# Patient Record
Sex: Female | Born: 1960 | Race: White | Hispanic: No | Smoking: Former smoker
Health system: Southern US, Community
[De-identification: ages and names within clinical notes are randomized; demographics above are authoritative.]

## PROBLEM LIST (undated history)

## (undated) DIAGNOSIS — L309 Dermatitis, unspecified: Secondary | ICD-10-CM

## (undated) DIAGNOSIS — I639 Cerebral infarction, unspecified: Secondary | ICD-10-CM

## (undated) DIAGNOSIS — E785 Hyperlipidemia, unspecified: Secondary | ICD-10-CM

## (undated) DIAGNOSIS — F329 Major depressive disorder, single episode, unspecified: Secondary | ICD-10-CM

## (undated) DIAGNOSIS — I10 Essential (primary) hypertension: Secondary | ICD-10-CM

## (undated) DIAGNOSIS — E559 Vitamin D deficiency, unspecified: Secondary | ICD-10-CM

## (undated) DIAGNOSIS — F32A Depression, unspecified: Secondary | ICD-10-CM

## (undated) DIAGNOSIS — K529 Noninfective gastroenteritis and colitis, unspecified: Secondary | ICD-10-CM

## (undated) HISTORY — DX: Vitamin D deficiency, unspecified: E55.9

## (undated) HISTORY — DX: Noninfective gastroenteritis and colitis, unspecified: K52.9

## (undated) HISTORY — DX: Depression, unspecified: F32.A

## (undated) HISTORY — DX: Essential (primary) hypertension: I10

## (undated) HISTORY — DX: Dermatitis, unspecified: L30.9

## (undated) HISTORY — DX: Cerebral infarction, unspecified: I63.9

## (undated) HISTORY — DX: Hyperlipidemia, unspecified: E78.5

## (undated) HISTORY — DX: Major depressive disorder, single episode, unspecified: F32.9

## (undated) HISTORY — PX: HAND SURGERY: SHX662

---

## 1998-03-01 ENCOUNTER — Ambulatory Visit (HOSPITAL_BASED_OUTPATIENT_CLINIC_OR_DEPARTMENT_OTHER): Admission: RE | Admit: 1998-03-01 | Discharge: 1998-03-01 | Payer: Self-pay | Admitting: *Deleted

## 1998-10-06 ENCOUNTER — Other Ambulatory Visit: Admission: RE | Admit: 1998-10-06 | Discharge: 1998-10-06 | Payer: Self-pay | Admitting: *Deleted

## 2000-01-18 ENCOUNTER — Other Ambulatory Visit: Admission: RE | Admit: 2000-01-18 | Discharge: 2000-01-18 | Payer: Self-pay | Admitting: *Deleted

## 2001-03-30 ENCOUNTER — Other Ambulatory Visit: Admission: RE | Admit: 2001-03-30 | Discharge: 2001-03-30 | Payer: Self-pay | Admitting: Gastroenterology

## 2001-03-30 ENCOUNTER — Encounter (INDEPENDENT_AMBULATORY_CARE_PROVIDER_SITE_OTHER): Payer: Self-pay

## 2004-11-19 ENCOUNTER — Other Ambulatory Visit: Admission: RE | Admit: 2004-11-19 | Discharge: 2004-11-19 | Payer: Self-pay | Admitting: Endocrinology

## 2005-10-09 ENCOUNTER — Ambulatory Visit: Payer: Self-pay | Admitting: Internal Medicine

## 2009-07-14 ENCOUNTER — Encounter: Admission: RE | Admit: 2009-07-14 | Discharge: 2009-07-14 | Payer: Self-pay | Admitting: Gynecology

## 2010-07-31 ENCOUNTER — Encounter: Admission: RE | Admit: 2010-07-31 | Discharge: 2010-07-31 | Payer: Self-pay | Admitting: Gynecology

## 2011-08-09 ENCOUNTER — Other Ambulatory Visit: Payer: Self-pay | Admitting: Gynecology

## 2011-08-09 DIAGNOSIS — Z1231 Encounter for screening mammogram for malignant neoplasm of breast: Secondary | ICD-10-CM

## 2011-08-16 ENCOUNTER — Ambulatory Visit
Admission: RE | Admit: 2011-08-16 | Discharge: 2011-08-16 | Disposition: A | Discharge status: Home / Self Care | Payer: BC Managed Care – PPO | Source: Ambulatory Visit | Attending: Gynecology | Admitting: Gynecology

## 2011-08-16 DIAGNOSIS — Z1231 Encounter for screening mammogram for malignant neoplasm of breast: Secondary | ICD-10-CM

## 2012-08-10 ENCOUNTER — Other Ambulatory Visit: Payer: Self-pay | Admitting: Family Medicine

## 2012-08-10 DIAGNOSIS — Z1231 Encounter for screening mammogram for malignant neoplasm of breast: Secondary | ICD-10-CM

## 2013-02-08 ENCOUNTER — Telehealth: Payer: Self-pay

## 2013-02-08 NOTE — Telephone Encounter (Signed)
appt made

## 2013-02-08 NOTE — Telephone Encounter (Signed)
Wants a referral 

## 2013-02-09 ENCOUNTER — Ambulatory Visit (INDEPENDENT_AMBULATORY_CARE_PROVIDER_SITE_OTHER): Payer: BC Managed Care – PPO | Admitting: Physician Assistant

## 2013-02-09 ENCOUNTER — Encounter: Payer: Self-pay | Admitting: Physician Assistant

## 2013-02-09 VITALS — BP 126/81 | HR 76 | Temp 97.3°F | Ht 64.0 in | Wt 162.2 lb

## 2013-02-09 DIAGNOSIS — F32A Depression, unspecified: Secondary | ICD-10-CM | POA: Insufficient documentation

## 2013-02-09 DIAGNOSIS — F329 Major depressive disorder, single episode, unspecified: Secondary | ICD-10-CM

## 2013-02-09 DIAGNOSIS — R5383 Other fatigue: Secondary | ICD-10-CM

## 2013-02-09 DIAGNOSIS — R5381 Other malaise: Secondary | ICD-10-CM

## 2013-02-09 LAB — THYROID PANEL WITH TSH
Free Thyroxine Index: 1.8 (ref 1.0–3.9)
T3 Uptake: 17 % — ABNORMAL LOW (ref 22.5–37.0)
T4, Total: 10.4 ug/dL (ref 5.0–12.5)
TSH: 2.064 u[IU]/mL (ref 0.350–4.500)

## 2013-02-09 LAB — POCT CBC
Granulocyte percent: 59.7 %G (ref 37–80)
HCT, POC: 39.9 % (ref 37.7–47.9)
Hemoglobin: 13.9 g/dL (ref 12.2–16.2)
Lymph, poc: 1.8 (ref 0.6–3.4)
MCH, POC: 31.9 pg — AB (ref 27–31.2)
MCHC: 34.9 g/dL (ref 31.8–35.4)
MCV: 91.3 fL (ref 80–97)
MPV: 7.2 fL (ref 0–99.8)
POC Granulocyte: 3 (ref 2–6.9)
POC LYMPH PERCENT: 36.7 %L (ref 10–50)
Platelet Count, POC: 277 10*3/uL (ref 142–424)
RBC: 4.4 M/uL (ref 4.04–5.48)
RDW, POC: 13.3 %
WBC: 5 10*3/uL (ref 4.6–10.2)

## 2013-02-09 NOTE — Patient Instructions (Signed)
Goiter Goiter is an enlarged thyroid gland. The thyroid gland sits at the base of the front of the neck. The gland produces hormones that regulate mood, body temperature, pulse rate, and digestion. Most goiters are painless and are not a cause for serious concern. Goiters and conditions that cause goiters can be treated if necessary.  CAUSES  Common causes of goiter include:  Graves disease (causes too much hormone to be produced [hyperthyroidism]).  Hashimoto's disease (causes too little hormone to be produced [hypothyroidism]).  Thyroiditis (inflammation of the thyroid sometimes caused by virus or pregnancy).  Nodular goiter (small bumps form; sometimes called toxic nodular goiter).  Pregnancy.  Thyroid cancer (very few goiters with nodules are cancerous).  Certain medications.  Radiation exposure.  Iodine deficiency (more common in developing countries in inland populations). RISK FACTORS Risk factors for goiter include:  A family history of goiter.  Female gender.  Inadequate iodine in the diet.  Age older than 40 years. SYMPTOMS  Many goiters do not cause symptoms. When symptoms do occur, they may include:  Swelling in the lower part of the neck. This swelling can range from a very small bump to a large lump.  A tight feeling in the throat.  A hoarse voice. Less commonly, a goiter may result in:  Coughing.  Wheezing.  Difficulty swallowing.  Difficulty breathing.  Bulging neck veins.  Dizziness. When a goiter is the result of hyperthyroidism, symptoms may include:  Rapid or irregular heart beat.  Sicknessin your stomach (nausea).  Vomiting.  Diarrhea.  Shaking.  Irritable feeling.  Bulging eyes.  Weight loss.  Heat sensitivity.  Anxiety. When a goiter is the result of hypothyroidism, symptoms may include:  Tiredness.  Dry skin.  Constipation.  Weight gain.  Irregular menstrual cycle.  Depressed mood.  Sensitivity to  cold. DIAGNOSIS  Tests used to diagnose goiter include:  A physical exam.  Blood tests, including thyroid hormone levels and antibody testing.  Ultrasonography, computerized X-ray scan (computed tomography, CT) or computerized magnetic scan (magnetic resonance imaging, MRI).  Thyroid scan (imaging along with safe radioactive injection).  Tissue sample taken (biopsy) of nodules. This is sometimes done to confirm that the nodules are not cancerous. TREATMENT  Treatment will depend on the cause of the goiter. Treatment may include:  Monitoring. In some cases, no treatment is necessary, and your doctor will monitor yourcondition at regular check ups.  Medications and supplements. Thyroid medication (thyroid hormone replacement) is available for hyperthroidism and hypothyroidism.  If inflammation is the cause, over-the-counter medication or steroid medication may be recommended.  Goiters caused by iodine deficiency can be treated with iodine supplements or changes in diet.  Radioactive iodine treatment. Radioactive iodine is injected into the blood. It travels to the thyroid gland, kills thyroid cells, and reduces the size of the gland. This is only used when the thyroid gland is overactive. Lifelong thyroid hormone medication is often necessary after this treatment.  Surgery. A procedure to remove all or part of the gland may be recommended in severe cases or when cancer is the cause. Hormones can be taken to replace the hormones normally produced by the thyroid. HOME CARE INSTRUCTIONS   Take medications as directed.  Follow your caregiver's recommendations for any dietary changes.  Follow up with your caregiver for further examination and testing, as directed. PREVENTION   If you have a family history of goiter, discuss screening with your doctor.  Make sure you are getting enough iodine in your diet.  Use   of iodized table salt can help prevent iodine deficiency. Document  Released: 02/13/2010 Document Revised: 11/18/2011 Document Reviewed: 02/13/2010 [employer] Patient Information 2014 [employer].  

## 2013-02-09 NOTE — Progress Notes (Signed)
Subjective:     Patient ID: Janet Randolph, female   DOB: 06/22/1961, 52 y.o.   MRN: [id number]  HPI Pt with a complicated hx She states long ago she was dx'd with goiter after a biopsy Per pt she then became hyperthyroid and was sent to endocrin There she was given Iodine therapy and per her was told she was cured She went back for several visits but never remembers being put on any meds Pt now with fatigue and weight gain She ws talking with her friends and they recommended she f/u Her endocrin has retired  Review of Systems  All other systems reviewed and are negative.       Objective:   Physical Exam  Nursing note and vitals reviewed.  Thyroid is prominent bilat No cerv nodes No bruits Heart- RRR w/o M Lungs- CTA    Assessment:     1. Other malaise and fatigue   2. Depression        Plan:     CBC and Thyroid profile done today Will inform of lab results F/U pending labs

## 2013-03-05 ENCOUNTER — Encounter: Payer: Self-pay | Admitting: Nurse Practitioner

## 2013-03-05 ENCOUNTER — Ambulatory Visit (INDEPENDENT_AMBULATORY_CARE_PROVIDER_SITE_OTHER): Payer: BC Managed Care – PPO | Admitting: Nurse Practitioner

## 2013-03-05 ENCOUNTER — Telehealth: Payer: Self-pay | Admitting: Family Medicine

## 2013-03-05 VITALS — BP 133/82 | HR 81 | Temp 97.0°F | Ht 64.0 in | Wt 162.0 lb

## 2013-03-05 DIAGNOSIS — I10 Essential (primary) hypertension: Secondary | ICD-10-CM

## 2013-03-05 DIAGNOSIS — E785 Hyperlipidemia, unspecified: Secondary | ICD-10-CM

## 2013-03-05 DIAGNOSIS — F32A Depression, unspecified: Secondary | ICD-10-CM

## 2013-03-05 DIAGNOSIS — F329 Major depressive disorder, single episode, unspecified: Secondary | ICD-10-CM

## 2013-03-05 DIAGNOSIS — F411 Generalized anxiety disorder: Secondary | ICD-10-CM

## 2013-03-05 MED ORDER — LISINOPRIL 20 MG PO TABS: 20.0000 mg | ORAL_TABLET | Freq: Every day | ORAL | Status: DC

## 2013-03-05 MED ORDER — VENLAFAXINE HCL ER 75 MG PO CP24: 75.0000 mg | ORAL_CAPSULE | Freq: Every day | ORAL | Status: DC

## 2013-03-05 MED ORDER — SIMVASTATIN 20 MG PO TABS: 20.0000 mg | ORAL_TABLET | Freq: Every evening | ORAL | Status: DC

## 2013-03-05 MED ORDER — ALPRAZOLAM 0.5 MG PO TABS: 0.5000 mg | ORAL_TABLET | Freq: Two times a day (BID) | ORAL | Status: DC

## 2013-03-05 MED ORDER — EFFEXOR XR 75 MG PO CP24: ORAL_CAPSULE | ORAL | Status: DC

## 2013-03-05 NOTE — Telephone Encounter (Signed)
Pt aware rx fixed at [facility]

## 2013-03-05 NOTE — Telephone Encounter (Signed)
Cant take generic for effxeor and you wrote it for one a day she usually has it for 2 a day

## 2013-03-05 NOTE — Telephone Encounter (Signed)
Corrected rx sent to pharmacy.

## 2013-03-05 NOTE — Progress Notes (Signed)
Subjective:    Patient ID: Janet Randolph, female    DOB: 01/07/1961, 52 y.o.   MRN: [id number]  Hypertension This is a chronic problem. The current episode started more than 1 year ago. The problem has been resolved since onset. The problem is controlled. Pertinent negatives include no blurred vision, chest pain, headaches, malaise/fatigue, orthopnea, palpitations, peripheral edema, shortness of breath or sweats. There are no associated agents to hypertension. Risk factors for coronary artery disease include dyslipidemia. Past treatments include ACE inhibitors. The current treatment provides moderate improvement. Compliance problems include diet.   Hyperlipidemia This is a chronic problem. The current episode started more than 1 year ago. The problem is uncontrolled. Recent lipid tests were reviewed and are high. She has no history of diabetes, hypothyroidism, liver disease or obesity. There are no known factors aggravating her hyperlipidemia. Pertinent negatives include no chest pain, leg pain, myalgias or shortness of breath. Current antihyperlipidemic treatment includes statins. The current treatment provides moderate improvement of lipids. Compliance problems include adherence to diet.  Risk factors for coronary artery disease include hypertension.  Depression Patient currently on effexoe 75 mg and is doing well. No complaint of side effects. GAD Xanax- takes one every morning- no complaints or problems   Review of Systems  Constitutional: Negative for malaise/fatigue.  Eyes: Negative for blurred vision.  Respiratory: Negative for shortness of breath.   Cardiovascular: Negative for chest pain, palpitations and orthopnea.  Musculoskeletal: Negative for myalgias.  Neurological: Negative for headaches.  All other systems reviewed and are negative.       Objective:   Physical Exam  Constitutional: She is oriented to person, place, and time. She appears well-developed and  well-nourished.  HENT:  Nose: Nose normal.  Mouth/Throat: Oropharynx is clear and moist.  Eyes: EOM are normal.  Neck: Trachea normal, normal range of motion and full passive range of motion without pain. Neck supple. No JVD present. Carotid bruit is not present. No thyromegaly present.  Cardiovascular: Normal rate, regular rhythm, normal heart sounds and intact distal pulses.  Exam reveals no gallop and no friction rub.   No murmur heard. Pulmonary/Chest: Effort normal and breath sounds normal.  Abdominal: Soft. Bowel sounds are normal. She exhibits no distension and no mass. There is no tenderness.  Musculoskeletal: Normal range of motion.  Lymphadenopathy:    She has no cervical adenopathy.  Neurological: She is alert and oriented to person, place, and time. She has normal reflexes.  Skin: Skin is warm and dry.  Psychiatric: She has a normal mood and affect. Her behavior is normal. Judgment and thought content normal.     BP 133/82  Pulse 81  Temp(Src) 97 F (36.1 C) (Oral)  Ht 5\' 4"  (1.626 m)  Wt 162 lb (73.483 kg)  BMI 27.79 kg/m2      Assessment & Plan:   1. Hyperlipidemia   2. Hypertension   3. Depression   4. GAD (generalized anxiety disorder)    Labs done several weeks ago- reviewed at appointment Meds ordered this encounter  Medications  . desonide (DESOWEN) 0.05 % cream    Sig:   . DISCONTD: lisinopril (PRINIVIL,ZESTRIL) 20 MG tablet    Sig: Take 20 mg by mouth daily.  Marland Kitchen DISCONTD: simvastatin (ZOCOR) 20 MG tablet    Sig: Take 20 mg by mouth every evening.  . venlafaxine XR (EFFEXOR XR) 75 MG 24 hr capsule    Sig: Take 1 capsule (75 mg total) by mouth daily.    Dispense:  30 capsule    Refill:  5    Order Specific Question:  Supervising Provider    Answer:  Ernestina Penna [[id number]]  . lisinopril (PRINIVIL,ZESTRIL) 20 MG tablet    Sig: Take 1 tablet (20 mg total) by mouth daily.    Dispense:  30 tablet    Refill:  5    Order Specific Question:   Supervising Provider    Answer:  Ernestina Penna [[id number]]  . simvastatin (ZOCOR) 20 MG tablet    Sig: Take 1 tablet (20 mg total) by mouth every evening.    Dispense:  30 tablet    Refill:  5    Order Specific Question:  Supervising Provider    Answer:  Ernestina Penna [[id number]]  . ALPRAZolam (XANAX) 0.5 MG tablet    Sig: Take 1 tablet (0.5 mg total) by mouth 2 (two) times daily.    Dispense:  60 tablet    Refill:  2    Order Specific Question:  Supervising Provider    Answer:  Deborra Medina   Continue all meds Diet and exercise encouraged  Mary-Margaret Daphine Deutscher, FNP

## 2013-03-05 NOTE — Patient Instructions (Signed)

## 2013-06-04 ENCOUNTER — Encounter: Payer: Self-pay | Admitting: Nurse Practitioner

## 2013-06-04 ENCOUNTER — Ambulatory Visit (INDEPENDENT_AMBULATORY_CARE_PROVIDER_SITE_OTHER): Payer: BC Managed Care – PPO | Admitting: Nurse Practitioner

## 2013-06-04 VITALS — BP 157/81 | HR 74 | Temp 97.3°F | Ht 64.0 in | Wt 163.0 lb

## 2013-06-04 DIAGNOSIS — F411 Generalized anxiety disorder: Secondary | ICD-10-CM

## 2013-06-04 DIAGNOSIS — I10 Essential (primary) hypertension: Secondary | ICD-10-CM

## 2013-06-04 DIAGNOSIS — F32A Depression, unspecified: Secondary | ICD-10-CM

## 2013-06-04 DIAGNOSIS — F3289 Other specified depressive episodes: Secondary | ICD-10-CM

## 2013-06-04 DIAGNOSIS — E785 Hyperlipidemia, unspecified: Secondary | ICD-10-CM

## 2013-06-04 DIAGNOSIS — F329 Major depressive disorder, single episode, unspecified: Secondary | ICD-10-CM

## 2013-06-04 MED ORDER — SIMVASTATIN 20 MG PO TABS: 20.0000 mg | ORAL_TABLET | Freq: Every evening | ORAL | Status: DC

## 2013-06-04 MED ORDER — ALPRAZOLAM 0.5 MG PO TABS: 0.5000 mg | ORAL_TABLET | Freq: Two times a day (BID) | ORAL | Status: DC

## 2013-06-04 MED ORDER — LISINOPRIL 20 MG PO TABS: 20.0000 mg | ORAL_TABLET | Freq: Every day | ORAL | Status: DC

## 2013-06-04 MED ORDER — EFFEXOR XR 75 MG PO CP24: ORAL_CAPSULE | ORAL | Status: DC

## 2013-06-04 NOTE — Progress Notes (Signed)
Subjective:    Patient ID: Janet Randolph, female    DOB: Aug 20, 1961, 52 y.o.   MRN: [id number]  Hypertension This is a chronic problem. The current episode started more than 1 year ago. The problem has been resolved since onset. The problem is controlled. Pertinent negatives include no blurred vision, chest pain, headaches, malaise/fatigue, orthopnea, palpitations, peripheral edema, shortness of breath or sweats. There are no associated agents to hypertension. Risk factors for coronary artery disease include dyslipidemia. Past treatments include ACE inhibitors. The current treatment provides moderate improvement. Compliance problems include diet.   Hyperlipidemia This is a chronic problem. The current episode started more than 1 year ago. The problem is uncontrolled. Recent lipid tests were reviewed and are high. She has no history of diabetes, hypothyroidism, liver disease or obesity. There are no known factors aggravating her hyperlipidemia. Pertinent negatives include no chest pain, leg pain, myalgias or shortness of breath. Current antihyperlipidemic treatment includes statins. The current treatment provides moderate improvement of lipids. Compliance problems include adherence to diet.  Risk factors for coronary artery disease include hypertension.  Depression Patient currently on effexoe 75 mg and is doing well. No complaint of side effects. GAD Xanax- takes one every morning- no complaints or problems   Review of Systems  Constitutional: Negative for malaise/fatigue.  Eyes: Negative for blurred vision.  Respiratory: Negative for shortness of breath.   Cardiovascular: Negative for chest pain, palpitations and orthopnea.  Musculoskeletal: Negative for myalgias.  Neurological: Negative for headaches.  All other systems reviewed and are negative.       Objective:   Physical Exam  Constitutional: She is oriented to person, place, and time. She appears well-developed and  well-nourished.  HENT:  Nose: Nose normal.  Mouth/Throat: Oropharynx is clear and moist.  Eyes: EOM are normal.  Neck: Trachea normal, normal range of motion and full passive range of motion without pain. Neck supple. No JVD present. Carotid bruit is not present. No thyromegaly present.  Cardiovascular: Normal rate, regular rhythm, normal heart sounds and intact distal pulses.  Exam reveals no gallop and no friction rub.   No murmur heard. Pulmonary/Chest: Effort normal and breath sounds normal.  Abdominal: Soft. Bowel sounds are normal. She exhibits no distension and no mass. There is no tenderness.  Musculoskeletal: Normal range of motion.  Lymphadenopathy:    She has no cervical adenopathy.  Neurological: She is alert and oriented to person, place, and time. She has normal reflexes.  Skin: Skin is warm and dry.  Psychiatric: She has a normal mood and affect. Her behavior is normal. Judgment and thought content normal.     BP 157/81  Pulse 74  Temp(Src) 97.3 F (36.3 C) (Oral)  Ht 5\' 4"  (1.626 m)  Wt 163 lb (73.936 kg)  BMI 27.97 kg/m2      Assessment & Plan:   1. GAD (generalized anxiety disorder)   2. Depression   3. Hyperlipidemia   4. Hypertension    Orders Placed This Encounter  Procedures  . CMP14+EGFR  . NMR, lipoprofile   Meds ordered this encounter  Medications  . ALPRAZolam (XANAX) 0.5 MG tablet    Sig: Take 1 tablet (0.5 mg total) by mouth 2 (two) times daily.    Dispense:  60 tablet    Refill:  2    Order Specific Question:  Supervising Provider    Answer:  Ernestina Penna [[id number]]  . EFFEXOR XR 75 MG 24 hr capsule    Sig: 2 PO QD  Dispense:  60 capsule    Refill:  5    Order Specific Question:  Supervising Provider    Answer:  Ernestina Penna [[id number]]  . simvastatin (ZOCOR) 20 MG tablet    Sig: Take 1 tablet (20 mg total) by mouth every evening.    Dispense:  30 tablet    Refill:  5    Order Specific Question:  Supervising Provider    Answer:   Ernestina Penna [[id number]]  . lisinopril (PRINIVIL,ZESTRIL) 20 MG tablet    Sig: Take 1 tablet (20 mg total) by mouth daily.    Dispense:  30 tablet    Refill:  5    Order Specific Question:  Supervising Provider    Answer:  Deborra Medina    Continue all meds Labs pending Diet and exercise encouraged Health maintenance reviewed Follow up in 3 months  Mary-Margaret Daphine Deutscher, FNP

## 2013-06-04 NOTE — Progress Notes (Deleted)
Subjective:    Patient ID: Janet Randolph, female    DOB: 01-17-1961, 52 y.o.   MRN: [id number]  Hypertension This is a chronic problem. The current episode started more than 1 year ago. The problem has been waxing and waning since onset. The problem is uncontrolled. Associated symptoms include anxiety. Pertinent negatives include no blurred vision, chest pain, headaches, malaise/fatigue, orthopnea, palpitations, peripheral edema, shortness of breath or sweats. There are no associated agents to hypertension. Risk factors for coronary artery disease include dyslipidemia, post-menopausal state and family history. Past treatments include ACE inhibitors. The current treatment provides moderate improvement. Compliance problems include diet.  There is no history of a thyroid problem.  Hyperlipidemia This is a chronic problem. The current episode started more than 1 year ago. The problem is uncontrolled. Recent lipid tests were reviewed and are high. She has no history of diabetes, hypothyroidism, liver disease or obesity. There are no known factors aggravating her hyperlipidemia. Pertinent negatives include no chest pain, leg pain, myalgias or shortness of breath. Current antihyperlipidemic treatment includes statins. The current treatment provides moderate improvement of lipids. Compliance problems include adherence to diet.  Risk factors for coronary artery disease include hypertension, dyslipidemia, family history and post-menopausal.  Anxiety Presents for follow-up visit. Onset was more than 5 years ago. The problem has been resolved. Symptoms include dizziness and dry mouth. Patient reports no chest pain, confusion, decreased concentration, insomnia, nervous/anxious behavior, palpitations or shortness of breath. Symptoms occur rarely. The quality of sleep is good. Nighttime awakenings: one to two.   There is no history of hyperthyroidism. Past treatments include benzodiazephines. The treatment provided  moderate relief. Compliance with prior treatments has been good.  Depression Patient currently on effexoe 75 mg and is doing well. No complaint of side effects.    Review of Systems  Constitutional: Negative for malaise/fatigue.  Eyes: Negative for blurred vision.  Respiratory: Negative for shortness of breath.   Cardiovascular: Negative for chest pain, palpitations and orthopnea.  Musculoskeletal: Negative for myalgias.  Neurological: Positive for dizziness. Negative for headaches.  Psychiatric/Behavioral: Negative for confusion and decreased concentration. The patient is not nervous/anxious and does not have insomnia.   All other systems reviewed and are negative.       Objective:   Physical Exam  Constitutional: She is oriented to person, place, and time. She appears well-developed and well-nourished.  HENT:  Nose: Nose normal.  Mouth/Throat: Oropharynx is clear and moist.  Eyes: EOM are normal.  Neck: Trachea normal, normal range of motion and full passive range of motion without pain. Neck supple. No JVD present. Carotid bruit is not present. No thyromegaly present.  Cardiovascular: Normal rate, regular rhythm, normal heart sounds and intact distal pulses.  Exam reveals no gallop and no friction rub.   No murmur heard. Pulmonary/Chest: Effort normal and breath sounds normal.  Abdominal: Soft. Bowel sounds are normal. She exhibits no distension and no mass. There is no tenderness.  Musculoskeletal: Normal range of motion.  Lymphadenopathy:    She has no cervical adenopathy.  Neurological: She is alert and oriented to person, place, and time. She has normal reflexes.  Skin: Skin is warm and dry.  Psychiatric: She has a normal mood and affect. Her behavior is normal. Judgment and thought content normal.     BP 157/81  Pulse 74  Temp(Src) 97.3 F (36.3 C) (Oral)  Ht 5\' 4"  (1.626 m)  Wt 163 lb (73.936 kg)  BMI 27.97 kg/m2      Assessment &  Plan:

## 2013-06-04 NOTE — Patient Instructions (Addendum)

## 2013-06-05 LAB — CMP14+EGFR
ALT: 7 IU/L (ref 0–32)
AST: 12 IU/L (ref 0–40)
Albumin/Globulin Ratio: 1.7 (ref 1.1–2.5)
Albumin: 3.8 g/dL (ref 3.5–5.5)
Alkaline Phosphatase: 61 IU/L (ref 39–117)
BUN/Creatinine Ratio: 23 (ref 9–23)
BUN: 17 mg/dL (ref 6–24)
CO2: 26 mmol/L (ref 18–29)
Calcium: 9.2 mg/dL (ref 8.7–10.2)
Chloride: 105 mmol/L (ref 97–108)
Creatinine, Ser: 0.73 mg/dL (ref 0.57–1.00)
GFR calc Af Amer: 108 mL/min/{1.73_m2} (ref >59)
GFR calc non Af Amer: 94 mL/min/{1.73_m2} (ref >59)
Globulin, Total: 2.3 g/dL (ref 1.5–4.5)
Glucose: 89 mg/dL (ref 65–99)
Potassium: 4.4 mmol/L (ref 3.5–5.2)
Sodium: 145 mmol/L — ABNORMAL HIGH (ref 134–144)
Total Bilirubin: 0.1 mg/dL (ref 0.0–1.2)
Total Protein: 6.1 g/dL (ref 6.0–8.5)

## 2013-06-05 LAB — NMR, LIPOPROFILE
Cholesterol: 212 mg/dL — ABNORMAL HIGH (ref <200)
HDL Cholesterol by NMR: 45 mg/dL (ref >=40)
HDL Particle Number: 32.2 umol/L (ref >=30.5)
LDL Particle Number: 1573 nmol/L — ABNORMAL HIGH (ref <1000)
LDL Size: 21.9 nm (ref >20.5)
LDLC SERPL CALC-MCNC: 133 mg/dL — ABNORMAL HIGH (ref <100)
LP-IR Score: 46 — ABNORMAL HIGH (ref <=45)
Small LDL Particle Number: 555 nmol/L — ABNORMAL HIGH (ref <=527)
Triglycerides by NMR: 171 mg/dL — ABNORMAL HIGH (ref <150)

## 2013-06-07 ENCOUNTER — Ambulatory Visit: Payer: BC Managed Care – PPO | Admitting: Nurse Practitioner

## 2013-06-08 ENCOUNTER — Telehealth: Payer: Self-pay | Admitting: Nurse Practitioner

## 2013-06-10 NOTE — Telephone Encounter (Signed)
Patient aware.

## 2013-09-13 ENCOUNTER — Ambulatory Visit: Payer: BC Managed Care – PPO | Admitting: Nurse Practitioner

## 2013-12-02 ENCOUNTER — Other Ambulatory Visit: Payer: Self-pay | Admitting: Nurse Practitioner

## 2013-12-03 ENCOUNTER — Telehealth: Payer: Self-pay | Admitting: Nurse Practitioner

## 2013-12-03 NOTE — Telephone Encounter (Signed)
ntbs for refill of xanax

## 2013-12-03 NOTE — Telephone Encounter (Signed)
Not seen or filled since 09/14, uses [facility] if approved

## 2013-12-03 NOTE — Telephone Encounter (Signed)
Patient aware.

## 2013-12-03 NOTE — Telephone Encounter (Signed)
Detailed message left for patient.

## 2013-12-13 ENCOUNTER — Encounter: Payer: Self-pay | Admitting: Nurse Practitioner

## 2013-12-13 ENCOUNTER — Ambulatory Visit (INDEPENDENT_AMBULATORY_CARE_PROVIDER_SITE_OTHER): Payer: BC Managed Care – PPO | Admitting: Nurse Practitioner

## 2013-12-13 VITALS — BP 139/77 | HR 78 | Temp 97.2°F | Ht 64.0 in | Wt 162.0 lb

## 2013-12-13 DIAGNOSIS — F411 Generalized anxiety disorder: Secondary | ICD-10-CM

## 2013-12-13 DIAGNOSIS — F3289 Other specified depressive episodes: Secondary | ICD-10-CM

## 2013-12-13 DIAGNOSIS — F32A Depression, unspecified: Secondary | ICD-10-CM

## 2013-12-13 DIAGNOSIS — F329 Major depressive disorder, single episode, unspecified: Secondary | ICD-10-CM

## 2013-12-13 DIAGNOSIS — I10 Essential (primary) hypertension: Secondary | ICD-10-CM

## 2013-12-13 DIAGNOSIS — E785 Hyperlipidemia, unspecified: Secondary | ICD-10-CM

## 2013-12-13 MED ORDER — ALPRAZOLAM 0.5 MG PO TABS: 0.5000 mg | ORAL_TABLET | Freq: Two times a day (BID) | ORAL | Status: DC

## 2013-12-13 MED ORDER — EFFEXOR XR 75 MG PO CP24: ORAL_CAPSULE | ORAL | Status: DC

## 2013-12-13 MED ORDER — LISINOPRIL 20 MG PO TABS: 20.0000 mg | ORAL_TABLET | Freq: Every day | ORAL | Status: DC

## 2013-12-13 MED ORDER — SIMVASTATIN 20 MG PO TABS: 20.0000 mg | ORAL_TABLET | Freq: Every evening | ORAL | Status: DC

## 2013-12-13 NOTE — Progress Notes (Signed)
Subjective:    Patient ID: Janet Randolph, female    DOB: August 09, 1961, 53 y.o.   MRN: [id number]  Patient here today for follow up of chronic medical problems- no complaints today.  Hypertension This is a chronic problem. The current episode started more than 1 year ago. The problem has been resolved since onset. The problem is controlled. Pertinent negatives include no blurred vision, chest pain, headaches, malaise/fatigue, orthopnea, palpitations, peripheral edema, shortness of breath or sweats. There are no associated agents to hypertension. Risk factors for coronary artery disease include dyslipidemia. Past treatments include ACE inhibitors. The current treatment provides moderate improvement. Compliance problems include diet.   Hyperlipidemia This is a chronic problem. The current episode started more than 1 year ago. The problem is uncontrolled. Recent lipid tests were reviewed and are high. She has no history of diabetes, hypothyroidism, liver disease or obesity. There are no known factors aggravating her hyperlipidemia. Pertinent negatives include no chest pain, leg pain, myalgias or shortness of breath. Current antihyperlipidemic treatment includes statins. The current treatment provides moderate improvement of lipids. Compliance problems include adherence to diet.  Risk factors for coronary artery disease include hypertension.  Depression Patient currently on effexoe 75 mg and is doing well. No complaint of side effects. GAD Xanax- takes one every morning- no complaints or problems   Review of Systems  Constitutional: Negative for malaise/fatigue.  Eyes: Negative for blurred vision.  Respiratory: Negative for shortness of breath.   Cardiovascular: Negative for chest pain, palpitations and orthopnea.  Musculoskeletal: Negative for myalgias.  Neurological: Negative for headaches.  All other systems reviewed and are negative.       Objective:   Physical Exam  Constitutional:  She is oriented to person, place, and time. She appears well-developed and well-nourished.  HENT:  Nose: Nose normal.  Mouth/Throat: Oropharynx is clear and moist.  Eyes: EOM are normal.  Neck: Trachea normal, normal range of motion and full passive range of motion without pain. Neck supple. No JVD present. Carotid bruit is not present. No thyromegaly present.  Cardiovascular: Normal rate, regular rhythm, normal heart sounds and intact distal pulses.  Exam reveals no gallop and no friction rub.   No murmur heard. Pulmonary/Chest: Effort normal and breath sounds normal.  Abdominal: Soft. Bowel sounds are normal. She exhibits no distension and no mass. There is no tenderness.  Musculoskeletal: Normal range of motion.  Lymphadenopathy:    She has no cervical adenopathy.  Neurological: She is alert and oriented to person, place, and time. She has normal reflexes.  Skin: Skin is warm and dry.  Psychiatric: She has a normal mood and affect. Her behavior is normal. Judgment and thought content normal.     BP 139/77  Pulse 78  Temp(Src) 97.2 F (36.2 C) (Oral)  Ht _0  (1.626 m)  Wt 162 lb (73.483 kg)  BMI 27.79 kg/m2      Assessment & Plan:   1. Hypertension   2. Hyperlipidemia   3. GAD (generalized anxiety disorder)   4. Depression    Orders Placed This Encounter  Procedures  . CMP14+EGFR  . NMR, lipoprofile   Meds ordered this encounter  Medications  . EFFEXOR XR 75 MG 24 hr capsule    Sig: 2 PO QD    Dispense:  60 capsule    Refill:  5    Order Specific Question:  Supervising Provider    Answer:  Chipper Herb [[id number]]  . simvastatin (ZOCOR) 20 MG tablet  Sig: Take 1 tablet (20 mg total) by mouth every evening.    Dispense:  30 tablet    Refill:  5    Order Specific Question:  Supervising Provider    Answer:  Chipper Herb [[id number]]  . lisinopril (PRINIVIL,ZESTRIL) 20 MG tablet    Sig: Take 1 tablet (20 mg total) by mouth daily.    Dispense:  30 tablet     Refill:  5    Order Specific Question:  Supervising Provider    Answer:  Chipper Herb [[id number]]  . ALPRAZolam (XANAX) 0.5 MG tablet    Sig: Take 1 tablet (0.5 mg total) by mouth 2 (two) times daily.    Dispense:  60 tablet    Refill:  2    Order Specific Question:  Supervising Provider    Answer:  Chipper Herb [[id number]]    Labs pending Health maintenance reviewed Diet and exercise encouraged Continue all meds Follow up  In 3 month   [place], FNP

## 2013-12-13 NOTE — Patient Instructions (Signed)

## 2013-12-14 LAB — CMP14+EGFR
ALT: 15 IU/L (ref 0–32)
AST: 17 IU/L (ref 0–40)
Albumin/Globulin Ratio: 1.9 (ref 1.1–2.5)
Albumin: 4.1 g/dL (ref 3.5–5.5)
Alkaline Phosphatase: 62 IU/L (ref 39–117)
BUN/Creatinine Ratio: 29 — ABNORMAL HIGH (ref 9–23)
BUN: 21 mg/dL (ref 6–24)
CO2: 23 mmol/L (ref 18–29)
Calcium: 9 mg/dL (ref 8.7–10.2)
Chloride: 104 mmol/L (ref 97–108)
Creatinine, Ser: 0.72 mg/dL (ref 0.57–1.00)
GFR calc Af Amer: 110 mL/min/{1.73_m2} (ref >59)
GFR calc non Af Amer: 95 mL/min/{1.73_m2} (ref >59)
Globulin, Total: 2.2 g/dL (ref 1.5–4.5)
Glucose: 79 mg/dL (ref 65–99)
Potassium: 3.9 mmol/L (ref 3.5–5.2)
Sodium: 142 mmol/L (ref 134–144)
Total Bilirubin: <0.2 mg/dL (ref 0.0–1.2)
Total Protein: 6.3 g/dL (ref 6.0–8.5)

## 2013-12-14 LAB — LIPID PANEL
Chol/HDL Ratio: 3.7 ratio units (ref 0.0–4.4)
Cholesterol, Total: 181 mg/dL (ref 100–199)
HDL: 49 mg/dL (ref >39)
LDL Calculated: 111 mg/dL — ABNORMAL HIGH (ref 0–99)
Triglycerides: 105 mg/dL (ref 0–149)
VLDL Cholesterol Cal: 21 mg/dL (ref 5–40)

## 2014-02-04 ENCOUNTER — Telehealth: Payer: Self-pay | Admitting: Nurse Practitioner

## 2014-03-14 ENCOUNTER — Ambulatory Visit: Payer: BC Managed Care – PPO | Admitting: Nurse Practitioner

## 2014-04-11 ENCOUNTER — Other Ambulatory Visit: Payer: Self-pay | Admitting: Nurse Practitioner

## 2014-04-11 ENCOUNTER — Ambulatory Visit: Payer: BC Managed Care – PPO | Admitting: Nurse Practitioner

## 2014-04-13 NOTE — Telephone Encounter (Signed)
Patient last seen in office on 12-13-13. Rx last filled on 03-10-14 for #[id number]. Please advise. If approved please route to Pool B so nurse can phone in to pharmacy

## 2014-04-13 NOTE — Telephone Encounter (Signed)
Patient NTBS for follow up and lab work Please call in xanax with 0 refills 

## 2014-04-13 NOTE — Telephone Encounter (Signed)
Called in.

## 2014-05-18 ENCOUNTER — Ambulatory Visit (INDEPENDENT_AMBULATORY_CARE_PROVIDER_SITE_OTHER): Payer: BC Managed Care – PPO | Admitting: Nurse Practitioner

## 2014-05-18 ENCOUNTER — Encounter: Payer: Self-pay | Admitting: Nurse Practitioner

## 2014-05-18 VITALS — BP 145/80 | HR 75 | Temp 96.9°F | Ht 64.0 in | Wt 158.0 lb

## 2014-05-18 DIAGNOSIS — Z713 Dietary counseling and surveillance: Secondary | ICD-10-CM

## 2014-05-18 DIAGNOSIS — F32A Depression, unspecified: Secondary | ICD-10-CM

## 2014-05-18 DIAGNOSIS — F411 Generalized anxiety disorder: Secondary | ICD-10-CM

## 2014-05-18 DIAGNOSIS — Z6827 Body mass index (BMI) 27.0-27.9, adult: Secondary | ICD-10-CM

## 2014-05-18 DIAGNOSIS — F329 Major depressive disorder, single episode, unspecified: Secondary | ICD-10-CM

## 2014-05-18 DIAGNOSIS — E785 Hyperlipidemia, unspecified: Secondary | ICD-10-CM

## 2014-05-18 DIAGNOSIS — I10 Essential (primary) hypertension: Secondary | ICD-10-CM

## 2014-05-18 DIAGNOSIS — F3289 Other specified depressive episodes: Secondary | ICD-10-CM

## 2014-05-18 MED ORDER — ALPRAZOLAM 0.5 MG PO TABS: ORAL_TABLET | ORAL | Status: DC

## 2014-05-18 MED ORDER — EFFEXOR XR 75 MG PO CP24: ORAL_CAPSULE | ORAL | Status: DC

## 2014-05-18 NOTE — Progress Notes (Signed)
Subjective:    Patient ID: Janet Randolph, female    DOB: [birth year], 53 y.o.   MRN: [id number]  Patient here today for follow up of chronic medical problems.  Hypertension This is a chronic problem. The current episode started more than 1 year ago. The problem has been resolved since onset. The problem is controlled. Pertinent negatives include no blurred vision, chest pain, headaches, malaise/fatigue, orthopnea, palpitations, peripheral edema, shortness of breath or sweats. There are no associated agents to hypertension. Risk factors for coronary artery disease include dyslipidemia. Past treatments include ACE inhibitors. The current treatment provides moderate improvement. Compliance problems include diet.   Hyperlipidemia This is a chronic problem. The current episode started more than 1 year ago. The problem is uncontrolled. Recent lipid tests were reviewed and are high. She has no history of diabetes, hypothyroidism, liver disease or obesity. There are no known factors aggravating her hyperlipidemia. Pertinent negatives include no chest pain, leg pain, myalgias or shortness of breath. Current antihyperlipidemic treatment includes statins. The current treatment provides moderate improvement of lipids. Compliance problems include adherence to diet.  Risk factors for coronary artery disease include hypertension.  Depression Patient currently on effexoe 75 mg and is doing well. No complaint of side effects. GAD Xanax- takes one every morning- no complaints or problems   Review of Systems  Constitutional: Negative for malaise/fatigue.  Eyes: Negative for blurred vision.  Respiratory: Negative for shortness of breath.   Cardiovascular: Negative for chest pain, palpitations and orthopnea.  Musculoskeletal: Negative for myalgias.  Neurological: Negative for headaches.  All other systems reviewed and are negative.      Objective:   Physical Exam  Constitutional: She is oriented to  person, place, and time. She appears well-developed and well-nourished.  HENT:  Nose: Nose normal.  Mouth/Throat: Oropharynx is clear and moist.  Eyes: EOM are normal.  Neck: Trachea normal, normal range of motion and full passive range of motion without pain. Neck supple. No JVD present. Carotid bruit is not present. No thyromegaly present.  Cardiovascular: Normal rate, regular rhythm, normal heart sounds and intact distal pulses.  Exam reveals no gallop and no friction rub.   No murmur heard. Pulmonary/Chest: Effort normal and breath sounds normal.  Abdominal: Soft. Bowel sounds are normal. She exhibits no distension and no mass. There is no tenderness.  Musculoskeletal: Normal range of motion.  Lymphadenopathy:    She has no cervical adenopathy.  Neurological: She is alert and oriented to person, place, and time. She has normal reflexes.  Skin: Skin is warm and dry.  Psychiatric: She has a normal mood and affect. Her behavior is normal. Judgment and thought content normal.     BP 145/80  Pulse 75  Temp(Src) 96.9 F (36.1 C) (Oral)  Ht 5' 4"  (1.626 m)  Wt 158 lb (71.668 kg)  BMI 27.11 kg/m2      Assessment & Plan:   1. Essential hypertension   2. Hyperlipidemia   3. GAD (generalized anxiety disorder)   4. Depression   5. BMI 27.0-27.9,adult   6. Weight loss counseling, encounter for    Orders Placed This Encounter  Procedures  . CMP14+EGFR  . NMR, lipoprofile   Meds ordered this encounter  Medications  . EFFEXOR XR 75 MG 24 hr capsule    Sig: 2 PO QD    Dispense:  60 capsule    Refill:  5    Order Specific Question:  Supervising Provider    Answer:  Chipper Herb [  [id number]]  . ALPRAZolam (XANAX) 0.5 MG tablet    Sig: TAKE 1 TABLET TWICE A DAY    Dispense:  60 tablet    Refill:  0    Not to exceed 3 additional fills before 06/11/2014    Order Specific Question:  Supervising Provider    Answer:  Chipper Herb [[id number]]   SMOKING CESSATION! Discussed weight  management for patient with BMI> 25 Labs pending Health maintenance reviewed Diet and exercise encouraged Continue all meds Follow up  In 3 months   [place], FNP

## 2014-05-18 NOTE — Patient Instructions (Signed)
Smoking Cessation Quitting smoking is important to your health and has many advantages. However, it is not always easy to quit since nicotine is a very addictive drug. Oftentimes, people try 3 times or more before being able to quit. This document explains the best ways for you to prepare to quit smoking. Quitting takes hard work and a lot of effort, but you can do it. ADVANTAGES OF QUITTING SMOKING  You will live longer, feel better, and live better.  Your body will feel the impact of quitting smoking almost immediately.  Within 20 minutes, blood pressure decreases. Your pulse returns to its normal level.  After 8 hours, carbon monoxide levels in the blood return to normal. Your oxygen level increases.  After 24 hours, the chance of having a heart attack starts to decrease. Your breath, hair, and body stop smelling like smoke.  After 48 hours, damaged nerve endings begin to recover. Your sense of taste and smell improve.  After 72 hours, the body is virtually free of nicotine. Your bronchial tubes relax and breathing becomes easier.  After 2 to 12 weeks, lungs can hold more air. Exercise becomes easier and circulation improves.  The risk of having a heart attack, stroke, cancer, or lung disease is greatly reduced.  After 1 year, the risk of coronary heart disease is cut in half.  After 5 years, the risk of stroke falls to the same as a nonsmoker.  After 10 years, the risk of lung cancer is cut in half and the risk of other cancers decreases significantly.  After 15 years, the risk of coronary heart disease drops, usually to the level of a nonsmoker.  If you are pregnant, quitting smoking will improve your chances of having a healthy baby.  The people you live with, especially any children, will be healthier.  You will have extra money to spend on things other than cigarettes. QUESTIONS TO THINK ABOUT BEFORE ATTEMPTING TO QUIT You may want to talk about your answers with your  health care provider.  Why do you want to quit?  If you tried to quit in the past, what helped and what did not?  What will be the most difficult situations for you after you quit? How will you plan to handle them?  Who can help you through the tough times? Your family? Friends? A health care provider?  What pleasures do you get from smoking? What ways can you still get pleasure if you quit? Here are some questions to ask your health care provider:  How can you help me to be successful at quitting?  What medicine do you think would be best for me and how should I take it?  What should I do if I need more help?  What is smoking withdrawal like? How can I get information on withdrawal? GET READY  Set a quit date.  Change your environment by getting rid of all cigarettes, ashtrays, matches, and lighters in your home, car, or work. Do not let people smoke in your home.  Review your past attempts to quit. Think about what worked and what did not. GET SUPPORT AND ENCOURAGEMENT You have a better chance of being successful if you have help. You can get support in many ways.  Tell your family, friends, and coworkers that you are going to quit and need their support. Ask them not to smoke around you.  Get individual, group, or telephone counseling and support. Programs are available at local hospitals and health centers. Call   your local health department for information about programs in your area.  Spiritual beliefs and practices may help some smokers quit.  Download a "quit meter" on your computer to keep track of quit statistics, such as how long you have gone without smoking, cigarettes not smoked, and money saved.  Get a self-help book about quitting smoking and staying off tobacco. LEARN NEW SKILLS AND BEHAVIORS  Distract yourself from urges to smoke. Talk to someone, go for a walk, or occupy your time with a task.  Change your normal routine. Take a different route to work.  Drink tea instead of coffee. Eat breakfast in a different place.  Reduce your stress. Take a hot bath, exercise, or read a book.  Plan something enjoyable to do every day. Reward yourself for not smoking.  Explore interactive web-based programs that specialize in helping you quit. GET MEDICINE AND USE IT CORRECTLY Medicines can help you stop smoking and decrease the urge to smoke. Combining medicine with the above behavioral methods and support can greatly increase your chances of successfully quitting smoking.  Nicotine replacement therapy helps deliver nicotine to your body without the negative effects and risks of smoking. Nicotine replacement therapy includes nicotine gum, lozenges, inhalers, nasal sprays, and skin patches. Some may be available over-the-counter and others require a prescription.  Antidepressant medicine helps people abstain from smoking, but how this works is unknown. This medicine is available by prescription.  Nicotinic receptor partial agonist medicine simulates the effect of nicotine in your brain. This medicine is available by prescription. Ask your health care provider for advice about which medicines to use and how to use them based on your health history. Your health care provider will tell you what side effects to look out for if you choose to be on a medicine or therapy. Carefully read the information on the package. Do not use any other product containing nicotine while using a nicotine replacement product.  RELAPSE OR DIFFICULT SITUATIONS Most relapses occur within the first 3 months after quitting. Do not be discouraged if you start smoking again. Remember, most people try several times before finally quitting. You may have symptoms of withdrawal because your body is used to nicotine. You may crave cigarettes, be irritable, feel very hungry, cough often, get headaches, or have difficulty concentrating. The withdrawal symptoms are only temporary. They are strongest  when you first quit, but they will go away within 10-14 days. To reduce the chances of relapse, try to:  Avoid drinking alcohol. Drinking lowers your chances of successfully quitting.  Reduce the amount of caffeine you consume. Once you quit smoking, the amount of caffeine in your body increases and can give you symptoms, such as a rapid heartbeat, sweating, and anxiety.  Avoid smokers because they can make you want to smoke.  Do not let weight gain distract you. Many smokers will gain weight when they quit, usually less than 10 pounds. Eat a healthy diet and stay active. You can always lose the weight gained after you quit.  Find ways to improve your mood other than smoking. FOR MORE INFORMATION  www.smokefree.gov  Document Released: 08/20/2001 Document Revised: 01/10/2014 Document Reviewed: 12/05/2011 ExitCare Patient Information 2015 [employer]. This information is not intended to replace advice given to you by your health care provider. Make sure you discuss any questions you have with your health care provider.  

## 2014-05-30 LAB — CMP14+EGFR
ALT: 12 IU/L (ref 0–32)
AST: 14 IU/L (ref 0–40)
Albumin/Globulin Ratio: 1.8 (ref 1.1–2.5)
Albumin: 4.2 g/dL (ref 3.5–5.5)
Alkaline Phosphatase: 57 IU/L (ref 39–117)
BUN/Creatinine Ratio: 34 — ABNORMAL HIGH (ref 9–23)
BUN: 25 mg/dL — ABNORMAL HIGH (ref 6–24)
CO2: 23 mmol/L (ref 18–29)
Calcium: 9 mg/dL (ref 8.7–10.2)
Chloride: 106 mmol/L (ref 97–108)
Creatinine, Ser: 0.73 mg/dL (ref 0.57–1.00)
GFR calc Af Amer: 108 mL/min/{1.73_m2} (ref >59)
GFR calc non Af Amer: 94 mL/min/{1.73_m2} (ref >59)
Globulin, Total: 2.3 g/dL (ref 1.5–4.5)
Glucose: 96 mg/dL (ref 65–99)
Potassium: 4.4 mmol/L (ref 3.5–5.2)
Sodium: 145 mmol/L — ABNORMAL HIGH (ref 134–144)
Total Bilirubin: <0.2 mg/dL (ref 0.0–1.2)
Total Protein: 6.5 g/dL (ref 6.0–8.5)

## 2014-05-30 LAB — SPECIMEN STATUS REPORT

## 2014-05-30 LAB — NMR, LIPOPROFILE
Cholesterol: 195 mg/dL (ref 100–199)
HDL Cholesterol by NMR: 43 mg/dL (ref >39)
HDL Particle Number: 31.4 umol/L (ref >=30.5)
LDL Particle Number: 1640 nmol/L — ABNORMAL HIGH (ref <1000)
LDL Size: 21.5 nm (ref >20.5)
LDLC SERPL CALC-MCNC: 114 mg/dL — ABNORMAL HIGH (ref 0–99)
LP-IR Score: 42 (ref <=45)
Small LDL Particle Number: 624 nmol/L — ABNORMAL HIGH (ref <=527)
Triglycerides by NMR: 189 mg/dL — ABNORMAL HIGH (ref 0–149)

## 2014-05-31 ENCOUNTER — Other Ambulatory Visit: Payer: Self-pay | Admitting: Nurse Practitioner

## 2014-05-31 MED ORDER — ATORVASTATIN CALCIUM 40 MG PO TABS: 40.0000 mg | ORAL_TABLET | Freq: Every day | ORAL | Status: DC

## 2014-06-24 ENCOUNTER — Other Ambulatory Visit: Payer: Self-pay

## 2014-07-19 ENCOUNTER — Ambulatory Visit (INDEPENDENT_AMBULATORY_CARE_PROVIDER_SITE_OTHER): Payer: BC Managed Care – PPO

## 2014-07-19 ENCOUNTER — Ambulatory Visit (INDEPENDENT_AMBULATORY_CARE_PROVIDER_SITE_OTHER): Payer: BC Managed Care – PPO | Admitting: Nurse Practitioner

## 2014-07-19 ENCOUNTER — Encounter: Payer: Self-pay | Admitting: Nurse Practitioner

## 2014-07-19 VITALS — BP 139/85 | HR 86 | Temp 97.0°F | Ht 65.0 in | Wt 150.4 lb

## 2014-07-19 DIAGNOSIS — F329 Major depressive disorder, single episode, unspecified: Secondary | ICD-10-CM

## 2014-07-19 DIAGNOSIS — F17200 Nicotine dependence, unspecified, uncomplicated: Secondary | ICD-10-CM

## 2014-07-19 DIAGNOSIS — E785 Hyperlipidemia, unspecified: Secondary | ICD-10-CM

## 2014-07-19 DIAGNOSIS — Z01419 Encounter for gynecological examination (general) (routine) without abnormal findings: Secondary | ICD-10-CM

## 2014-07-19 DIAGNOSIS — Z720 Tobacco use: Secondary | ICD-10-CM

## 2014-07-19 DIAGNOSIS — I10 Essential (primary) hypertension: Secondary | ICD-10-CM

## 2014-07-19 DIAGNOSIS — Z0000 Encounter for general adult medical examination without abnormal findings: Secondary | ICD-10-CM

## 2014-07-19 DIAGNOSIS — F32A Depression, unspecified: Secondary | ICD-10-CM

## 2014-07-19 LAB — POCT CBC
Granulocyte percent: 75.5 %G (ref 37–80)
HCT, POC: 41.1 % (ref 37.7–47.9)
Hemoglobin: 13.7 g/dL (ref 12.2–16.2)
Lymph, poc: 1.6 (ref 0.6–3.4)
MCH, POC: 31.3 pg — AB (ref 27–31.2)
MCHC: 33.3 g/dL (ref 31.8–35.4)
MCV: 94 fL (ref 80–97)
MPV: 6.9 fL (ref 0–99.8)
POC Granulocyte: 5.9 (ref 2–6.9)
POC LYMPH PERCENT: 20.1 %L (ref 10–50)
Platelet Count, POC: 323 10*3/uL (ref 142–424)
RBC: 4.4 M/uL (ref 4.04–5.48)
RDW, POC: 13.2 %
WBC: 7.8 10*3/uL (ref 4.6–10.2)

## 2014-07-19 LAB — POCT URINALYSIS DIPSTICK
Bilirubin, UA: NEGATIVE
Glucose, UA: NEGATIVE
Ketones, UA: NEGATIVE
Nitrite, UA: NEGATIVE
Protein, UA: NEGATIVE
Spec Grav, UA: 1.01
Urobilinogen, UA: NEGATIVE
pH, UA: 5

## 2014-07-19 LAB — POCT UA - MICROSCOPIC ONLY
Bacteria, U Microscopic: NEGATIVE
Casts, Ur, LPF, POC: NEGATIVE
Crystals, Ur, HPF, POC: NEGATIVE
Mucus, UA: NEGATIVE
Yeast, UA: NEGATIVE

## 2014-07-19 MED ORDER — EFFEXOR XR 75 MG PO CP24: ORAL_CAPSULE | ORAL | Status: DC

## 2014-07-19 MED ORDER — LISINOPRIL 20 MG PO TABS: 20.0000 mg | ORAL_TABLET | Freq: Every day | ORAL | Status: DC

## 2014-07-19 NOTE — Progress Notes (Addendum)
   Subjective:    Patient ID: Janet Randolph, female    DOB: 1961-05-31, 53 y.o.   MRN: [id number]  HPI Patient here today for annual physical exam and PAP- was seen for regular follow up in September. SHe is doing well today without complaints.    Review of Systems  Constitutional: Negative.   HENT: Negative.   Respiratory: Negative.   Cardiovascular: Negative.   Gastrointestinal: Negative.   Genitourinary: Negative.   Neurological: Negative.   Psychiatric/Behavioral: Negative.   All other systems reviewed and are negative.      Objective:   Physical Exam  Constitutional: She is oriented to person, place, and time. She appears well-developed and well-nourished.  HENT:  Head: Normocephalic.  Right Ear: Hearing, tympanic membrane, external ear and ear canal normal.  Left Ear: Hearing, tympanic membrane, external ear and ear canal normal.  Nose: Nose normal.  Mouth/Throat: Uvula is midline and oropharynx is clear and moist.  Eyes: Conjunctivae and EOM are normal. Pupils are equal, round, and reactive to light.  Neck: Normal range of motion and full passive range of motion without pain. Neck supple. No JVD present. Carotid bruit is not present. No thyroid mass and no thyromegaly present.  Cardiovascular: Normal rate, normal heart sounds and intact distal pulses.   No murmur heard. Pulmonary/Chest: Effort normal and breath sounds normal. Right breast exhibits no inverted nipple, no mass, no nipple discharge, no skin change and no tenderness. Left breast exhibits no inverted nipple, no mass, no nipple discharge, no skin change and no tenderness.  Abdominal: Soft. Bowel sounds are normal. She exhibits no mass. There is no tenderness.  Genitourinary: Vagina normal and uterus normal. No breast swelling, tenderness, discharge or bleeding.  bimanual exam-No adnexal masses or tenderness.   Musculoskeletal: Normal range of motion.  Lymphadenopathy:    She has no cervical adenopathy.    Neurological: She is alert and oriented to person, place, and time.  Skin: Skin is warm and dry.  Psychiatric: She has a normal mood and affect. Her behavior is normal. Judgment and thought content normal.    BP 139/85 mmHg  Pulse 86  Temp(Src) 97 F (36.1 C) (Oral)  Ht $R'5\' 5"'uY$  (1.651 m)  Wt 150 lb 6 oz (68.21 kg)  BMI 25.02 kg/m2  Chest xray- No acute findings- slight bronchitic changes-Preliminary reading by Ronnald Collum, FNP  [facility]  EKG- Kerry Hough, FNP      Assessment & Plan:  1. Annual physical exam - POCT urinalysis dipstick - POCT UA - Microscopic Only - POCT CBC - Thyroid Panel With TSH  2. Essential hypertension Low NA diet - CMP14+EGFR - EKG 12-Lead - lisinopril (PRINIVIL,ZESTRIL) 20 MG tablet; Take 1 tablet (20 mg total) by mouth daily.  Dispense: 30 tablet; Refill: 5  3. Hyperlipidemia Low fat - NMR, lipoprofile  4. Encounter for routine gynecological examination - Pap IG w/ reflex to HPV when ASC-U  5. Smoker Stop smoking - DG Chest 2 View; Future  6. Depression Stress management - EFFEXOR XR 75 MG 24 hr capsule; 2 PO QD  Dispense: 60 capsule; Refill: 5   hemoccult cards given to patient with directions Labs pending Health maintenance reviewed Diet and exercise encouraged Continue all meds Follow up  In 6 month  [place], FNP

## 2014-07-19 NOTE — Patient Instructions (Signed)

## 2014-07-20 ENCOUNTER — Other Ambulatory Visit: Payer: Self-pay | Admitting: Nurse Practitioner

## 2014-07-20 ENCOUNTER — Telehealth: Payer: Self-pay | Admitting: *Deleted

## 2014-07-20 DIAGNOSIS — R918 Other nonspecific abnormal finding of lung field: Secondary | ICD-10-CM

## 2014-07-20 LAB — THYROID PANEL WITH TSH
Free Thyroxine Index: 1.8 (ref 1.2–4.9)
T3 Uptake Ratio: 16 % — ABNORMAL LOW (ref 24–39)
T4, Total: 11 ug/dL (ref 4.5–12.0)
TSH: 1.47 u[IU]/mL (ref 0.450–4.500)

## 2014-07-20 LAB — CMP14+EGFR
ALT: 13 IU/L (ref 0–32)
AST: 18 IU/L (ref 0–40)
Albumin/Globulin Ratio: 1.8 (ref 1.1–2.5)
Albumin: 4.4 g/dL (ref 3.5–5.5)
Alkaline Phosphatase: 64 IU/L (ref 39–117)
BUN/Creatinine Ratio: 19 (ref 9–23)
BUN: 16 mg/dL (ref 6–24)
CO2: 22 mmol/L (ref 18–29)
Calcium: 9.2 mg/dL (ref 8.7–10.2)
Chloride: 104 mmol/L (ref 97–108)
Creatinine, Ser: 0.85 mg/dL (ref 0.57–1.00)
GFR calc Af Amer: 89 mL/min/{1.73_m2} (ref >59)
GFR calc non Af Amer: 77 mL/min/{1.73_m2} (ref >59)
Globulin, Total: 2.4 g/dL (ref 1.5–4.5)
Glucose: 85 mg/dL (ref 65–99)
Potassium: 3.9 mmol/L (ref 3.5–5.2)
Sodium: 143 mmol/L (ref 134–144)
Total Bilirubin: <0.2 mg/dL (ref 0.0–1.2)
Total Protein: 6.8 g/dL (ref 6.0–8.5)

## 2014-07-20 LAB — NMR, LIPOPROFILE
Cholesterol: 235 mg/dL — ABNORMAL HIGH (ref 100–199)
HDL Cholesterol by NMR: 51 mg/dL (ref >39)
HDL Particle Number: 32.1 umol/L (ref >=30.5)
LDL Particle Number: 1746 nmol/L — ABNORMAL HIGH (ref <1000)
LDL Size: 21.7 nm (ref >20.5)
LDL-C: 163 mg/dL — ABNORMAL HIGH (ref 0–99)
LP-IR Score: 36 (ref <=45)
Small LDL Particle Number: 469 nmol/L (ref <=527)
Triglycerides by NMR: 105 mg/dL (ref 0–149)

## 2014-07-20 NOTE — Telephone Encounter (Signed)
Aware of need for CT.

## 2014-07-20 NOTE — Telephone Encounter (Signed)
-----   Message from Bennie PieriniMary-Margaret Martin, FNP sent at 07/20/2014 10:57 AM EST ----- Patient needs chest Ct to varify nodules- will order

## 2014-07-21 LAB — PAP IG W/ RFLX HPV ASCU: PAP Smear Comment: 0

## 2014-07-22 ENCOUNTER — Ambulatory Visit (HOSPITAL_COMMUNITY)
Admission: RE | Admit: 2014-07-22 | Discharge: 2014-07-22 | Disposition: A | Discharge status: Home / Self Care | Payer: BC Managed Care – PPO | Source: Ambulatory Visit | Attending: Nurse Practitioner | Admitting: Nurse Practitioner

## 2014-07-22 ENCOUNTER — Other Ambulatory Visit: Payer: Self-pay | Admitting: Nurse Practitioner

## 2014-07-22 DIAGNOSIS — F17210 Nicotine dependence, cigarettes, uncomplicated: Secondary | ICD-10-CM | POA: Diagnosis not present

## 2014-07-22 DIAGNOSIS — E049 Nontoxic goiter, unspecified: Secondary | ICD-10-CM | POA: Insufficient documentation

## 2014-07-22 DIAGNOSIS — M438X4 Other specified deforming dorsopathies, thoracic region: Secondary | ICD-10-CM | POA: Insufficient documentation

## 2014-07-22 DIAGNOSIS — R911 Solitary pulmonary nodule: Secondary | ICD-10-CM | POA: Insufficient documentation

## 2014-07-22 DIAGNOSIS — R918 Other nonspecific abnormal finding of lung field: Secondary | ICD-10-CM

## 2014-07-22 DIAGNOSIS — M899 Disorder of bone, unspecified: Secondary | ICD-10-CM | POA: Diagnosis not present

## 2014-07-22 NOTE — Telephone Encounter (Signed)
Last filled 06/13/14, last seen 07/19/14. Route to pool, call into [facility]

## 2014-07-25 ENCOUNTER — Telehealth: Payer: Self-pay | Admitting: Nurse Practitioner

## 2014-07-25 ENCOUNTER — Other Ambulatory Visit: Payer: Self-pay | Admitting: *Deleted

## 2014-07-25 ENCOUNTER — Other Ambulatory Visit: Payer: Self-pay | Admitting: Nurse Practitioner

## 2014-07-25 DIAGNOSIS — E049 Nontoxic goiter, unspecified: Secondary | ICD-10-CM

## 2014-07-25 NOTE — Telephone Encounter (Signed)
Printed and gave to Liz ClaiborneDonna L.

## 2014-07-25 NOTE — Telephone Encounter (Signed)
Please call in xanax with 1 refills 

## 2014-07-25 NOTE — Telephone Encounter (Signed)
Xanax refills called to [facility].

## 2014-07-27 ENCOUNTER — Telehealth: Payer: Self-pay

## 2014-07-27 NOTE — Telephone Encounter (Signed)
[facility] at 2:00pm for an ultrasound; there is no prep for this exam

## 2014-08-01 ENCOUNTER — Ambulatory Visit (HOSPITAL_COMMUNITY): Payer: BC Managed Care – PPO

## 2014-08-08 ENCOUNTER — Ambulatory Visit (HOSPITAL_COMMUNITY)
Admission: RE | Admit: 2014-08-08 | Discharge: 2014-08-08 | Disposition: A | Discharge status: Home / Self Care | Payer: BC Managed Care – PPO | Source: Ambulatory Visit | Attending: Nurse Practitioner | Admitting: Nurse Practitioner

## 2014-08-08 ENCOUNTER — Other Ambulatory Visit: Payer: Self-pay | Admitting: Nurse Practitioner

## 2014-08-08 ENCOUNTER — Telehealth: Payer: Self-pay

## 2014-08-08 DIAGNOSIS — E049 Nontoxic goiter, unspecified: Secondary | ICD-10-CM | POA: Insufficient documentation

## 2014-08-08 NOTE — Telephone Encounter (Signed)
[facility] to X-ray 

## 2014-08-09 NOTE — Telephone Encounter (Signed)
-----   Message from Bennie PieriniMary-Margaret Martin, FNP sent at 08/08/2014  3:42 PM EST ----- Needs needle bx of thyroid

## 2014-08-09 NOTE — Telephone Encounter (Signed)
i do not know how to order- have tried

## 2014-08-09 NOTE — Telephone Encounter (Signed)
Pt aware of results and need for biopsy; [facility]

## 2014-08-10 ENCOUNTER — Other Ambulatory Visit: Payer: Self-pay | Admitting: Nurse Practitioner

## 2014-08-10 DIAGNOSIS — E041 Nontoxic single thyroid nodule: Secondary | ICD-10-CM

## 2014-08-11 ENCOUNTER — Other Ambulatory Visit: Payer: Self-pay | Admitting: Nurse Practitioner

## 2014-08-11 ENCOUNTER — Ambulatory Visit (HOSPITAL_COMMUNITY)
Admission: RE | Admit: 2014-08-11 | Discharge: 2014-08-11 | Disposition: A | Discharge status: Home / Self Care | Payer: BC Managed Care – PPO | Source: Ambulatory Visit | Attending: Nurse Practitioner | Admitting: Nurse Practitioner

## 2014-08-11 DIAGNOSIS — E041 Nontoxic single thyroid nodule: Secondary | ICD-10-CM | POA: Insufficient documentation

## 2014-08-11 MED ORDER — LIDOCAINE HCL (PF) 2 % IJ SOLN
10.0000 mL | Freq: Once | INTRAMUSCULAR | Status: AC
  Administered 2014-08-11: 10 mL

## 2014-08-11 MED ORDER — LIDOCAINE HCL (PF) 2 % IJ SOLN
INTRAMUSCULAR | Status: AC
  Administered 2014-08-11: 10 mL
  Filled 2014-08-11: qty 10

## 2014-08-11 NOTE — Sedation Documentation (Signed)
Consent signed and verified, pt has no questions for the radiologist

## 2014-08-11 NOTE — Sedation Documentation (Signed)
Samples attained, labeled at bedside, sent for analysis, pt tolerating procedure well.

## 2014-08-11 NOTE — Discharge Instructions (Signed)
Thyroid Biopsy °The thyroid gland is a butterfly-shaped gland situated in the front of the neck. It produces hormones which affect metabolism, growth and development, and body temperature. A thyroid biopsy is a procedure in which small samples of tissue or fluid are removed from the thyroid gland or mass and examined under a microscope. This test is done to determine the cause of thyroid problems, such as infection, cancer, or other thyroid problems. °There are 2 ways to obtain samples: °1. Fine needle biopsy. Samples are removed using a thin needle inserted through the skin and into the thyroid gland or mass. °2. Open biopsy. Samples are removed after a cut (incision) is made through the skin. °LET YOUR CAREGIVER KNOW ABOUT:  °· Allergies. °· Medications taken including herbs, eye drops, over-the-counter medications, and creams. °· Use of steroids (by mouth or creams). °· Previous problems with anesthetics or numbing medicine. °· Possibility of pregnancy, if this applies. °· History of blood clots (thrombophlebitis). °· History of bleeding or blood problems. °· Previous surgery. °· Other health problems. °RISKS AND COMPLICATIONS °· Bleeding from the site. The risk of bleeding is higher if you have a bleeding disorder or are taking any blood thinning medications (anticoagulants). °· Infection. °· Injury to structures near the thyroid gland. °BEFORE THE PROCEDURE  °This is a procedure that can be done as an outpatient. Confirm the time that you need to arrive for your procedure. Confirm whether there is a need to fast or withhold any medications. A blood sample may be done to determine your blood clotting time. Medicine may be given to help you relax (sedative). °PROCEDURE °Fine needle biopsy. °You will be awake during the procedure. You may be asked to lie on your back with your head tipped backward to extend your neck. Let your caregiver know if you cannot tolerate the positioning. An area on your neck will be  cleansed. A needle is inserted through the skin of your neck. You may feel a mild discomfort during this procedure. You may be asked to avoid coughing, talking, swallowing, or making sounds during some portions of the procedure. The needle is withdrawn once tissue or fluid samples have been removed. Pressure may be applied to the neck to reduce swelling and ensure that bleeding has stopped. The samples will be sent for examination.  °Open biopsy. °You will be given general anesthesia. You will be asleep during the procedure. An incision is made in your neck. A sample of thyroid tissue or the mass is removed. The tissue sample or mass will be sent for examination. The sample or mass may be examined during the biopsy. If the sample or mass contains cancer cells, some or all of the thyroid gland may be removed. The incision is closed with stitches. °AFTER THE PROCEDURE  °Your recovery will be assessed and monitored. If there are no problems, as an outpatient, you should be able to go home shortly after the procedure. °If you had a fine needle biopsy: °· You may have soreness at the biopsy site for 1 to 2 days. °If you had an open biopsy:  °· You may have soreness at the biopsy site for 3 to 4 days. °· You may have a hoarse voice or sore throat for 1 to 2 days. °Obtaining the Test Results °It is your responsibility to obtain your test results. Do not assume everything is normal if you have not heard from your caregiver or the medical facility. It is important for you to follow up   on all of your test results. °HOME CARE INSTRUCTIONS  °· Keeping your head raised on a pillow when you are lying down may ease biopsy site discomfort. °· Supporting the back of your head and neck with both hands as you sit up from a lying position may ease biopsy site discomfort. °· Only take over-the-counter or prescription medicines for pain, discomfort, or fever as directed by your caregiver. °· Throat lozenges or gargling with warm salt  water may help to soothe a sore throat. °SEEK IMMEDIATE MEDICAL CARE IF:  °· You have severe bleeding from the biopsy site. °· You have difficulty swallowing. °· You have a fever. °· You have increased pain, swelling, redness, or warmth at the biopsy site. °· You notice pus coming from the biopsy site. °· You have swollen glands (lymph nodes) in your neck. °Document Released: 06/23/2007 Document Revised: 12/21/2012 Document Reviewed: 11/18/2013 °ExitCare® Patient Information ©2015 [employer]. This information is not intended to replace advice given to you by your health care provider. Make sure you discuss any questions you have with your health care provider. ° °

## 2014-08-12 NOTE — Procedures (Signed)
PreOperative Dx: 2 LEFT thyroid nodules Postoperative Dx: 2 LEFT thyroid nodules Procedure:   US guided FNA of 2 LEFT thyroid nodules Radiologist:  Tyron RussellBoles Anesthesia:  1.5 ml of 2% lidocaine Specimen:  FNA x 3 of each nodule  EBL:   < 1 ml Complications: None

## 2014-08-19 ENCOUNTER — Ambulatory Visit: Payer: BC Managed Care – PPO | Admitting: Nurse Practitioner

## 2014-08-19 ENCOUNTER — Telehealth: Payer: Self-pay | Admitting: Nurse Practitioner

## 2014-08-19 NOTE — Telephone Encounter (Signed)
Left detailed msg stating she should call the physician that did the bx to get the results but to CB with any questions, will close call.

## 2014-08-22 ENCOUNTER — Telehealth: Payer: Self-pay | Admitting: Nurse Practitioner

## 2014-08-22 NOTE — Telephone Encounter (Signed)
Patient is requesting Biopsy results on thyroid. She stated that you sent her for these test. Can you please review her resluts.

## 2014-08-22 NOTE — Telephone Encounter (Signed)
Not sure what patient is talking about- nothing on chart about surgery

## 2014-08-23 NOTE — Telephone Encounter (Signed)
Aware of results. 

## 2014-08-23 NOTE — Telephone Encounter (Signed)
Ok have faxed over please

## 2014-08-23 NOTE — Telephone Encounter (Signed)
Follicular lesion of insignificance according to report

## 2014-10-05 ENCOUNTER — Encounter: Payer: Self-pay | Admitting: Nurse Practitioner

## 2014-10-05 ENCOUNTER — Ambulatory Visit (INDEPENDENT_AMBULATORY_CARE_PROVIDER_SITE_OTHER): Payer: 59 | Admitting: Nurse Practitioner

## 2014-10-05 VITALS — BP 160/84 | HR 73 | Temp 96.8°F | Ht 65.0 in | Wt 157.0 lb

## 2014-10-05 DIAGNOSIS — F329 Major depressive disorder, single episode, unspecified: Secondary | ICD-10-CM

## 2014-10-05 DIAGNOSIS — E785 Hyperlipidemia, unspecified: Secondary | ICD-10-CM

## 2014-10-05 DIAGNOSIS — F411 Generalized anxiety disorder: Secondary | ICD-10-CM

## 2014-10-05 DIAGNOSIS — F32A Depression, unspecified: Secondary | ICD-10-CM

## 2014-10-05 DIAGNOSIS — I10 Essential (primary) hypertension: Secondary | ICD-10-CM

## 2014-10-05 DIAGNOSIS — J0101 Acute recurrent maxillary sinusitis: Secondary | ICD-10-CM

## 2014-10-05 MED ORDER — AZITHROMYCIN 250 MG PO TABS: ORAL_TABLET | ORAL | Status: DC

## 2014-10-05 MED ORDER — ALPRAZOLAM 0.5 MG PO TABS: 0.5000 mg | ORAL_TABLET | Freq: Two times a day (BID) | ORAL | Status: DC

## 2014-10-05 NOTE — Patient Instructions (Signed)

## 2014-10-05 NOTE — Addendum Note (Signed)
Addended by: Bennie PieriniMARTIN, MARY-MARGARET on: 10/05/2014 04:46 PM   Modules accepted: Orders

## 2014-10-05 NOTE — Progress Notes (Addendum)
Subjective:    Patient ID: Janet Randolph, female    DOB: [birth year], 54 y.o.   MRN: [id number]  Patient here today for follow up of chronic medical problems. Forgets to take chronic meds a lot.  * C/O sinus congestion, facial pressure and headache- Started 3 days ago- teeth hurt to chew food- denies fever and cough  Hypertension This is a chronic problem. The current episode started more than 1 year ago. The problem is unchanged. The problem is uncontrolled. Pertinent negatives include no chest pain, headaches, palpitations or shortness of breath. Risk factors for coronary artery disease include dyslipidemia and post-menopausal state. Past treatments include ACE inhibitors. The current treatment provides mild improvement. Compliance problems include diet and exercise.   Hyperlipidemia This is a chronic problem. The current episode started more than 1 year ago. The problem is controlled. Recent lipid tests were reviewed and are normal. She has no history of diabetes or hypothyroidism. Pertinent negatives include no chest pain, myalgias or shortness of breath. Current antihyperlipidemic treatment includes statins. The current treatment provides moderate improvement of lipids. Compliance problems include adherence to diet and adherence to exercise.  Risk factors for coronary artery disease include dyslipidemia, hypertension and post-menopausal.  Depression Patient currently on effexor 75 mg and is doing well. No complaint of side effects. GAD Xanax- takes one every morning- no complaints or problems   Review of Systems  Constitutional: Negative for fever and chills.  HENT: Positive for congestion, rhinorrhea and sinus pressure.   Respiratory: Negative for cough and shortness of breath.   Cardiovascular: Negative for chest pain and palpitations.  Musculoskeletal: Negative for myalgias.  Neurological: Negative for headaches.  Psychiatric/Behavioral: Negative.   All other systems reviewed and  are negative.      Objective:   Physical Exam  Constitutional: She is oriented to person, place, and time. She appears well-developed and well-nourished.  HENT:  Nose: Nose normal.  Mouth/Throat: Oropharynx is clear and moist.  Eyes: EOM are normal.  Neck: Trachea normal, normal range of motion and full passive range of motion without pain. Neck supple. No JVD present. Carotid bruit is not present. No thyromegaly present.  Cardiovascular: Normal rate, regular rhythm, normal heart sounds and intact distal pulses.  Exam reveals no gallop and no friction rub.   No murmur heard. Pulmonary/Chest: Effort normal and breath sounds normal.  Abdominal: Soft. Bowel sounds are normal. She exhibits no distension and no mass. There is no tenderness.  Musculoskeletal: Normal range of motion.  Lymphadenopathy:    She has no cervical adenopathy.  Neurological: She is alert and oriented to person, place, and time. She has normal reflexes.  Skin: Skin is warm and dry.  Psychiatric: She has a normal mood and affect. Her behavior is normal. Judgment and thought content normal.   BP 160/84 mmHg  Pulse 73  Temp(Src) 96.8 F (36 C) (Oral)  Ht _0  (1.651 m)  Wt 157 lb (71.215 kg)  BMI 26.13 kg/m2         Assessment & Plan:   1. Essential hypertension Do not add salt to diet - CMP14+EGFR  2. Hyperlipidemia Low fat diet - NMR, lipoprofile  3. GAD (generalized anxiety disorder) Stress management - ALPRAZolam (XANAX) 0.5 MG tablet; Take 1 tablet (0.5 mg total) by mouth 2 (two) times daily.  Dispense: 60 tablet; Refill: 1  4. Depression  5. Maxillary sinusitis 1. Take meds as prescribed 2. Use a cool mist humidifier especially during the winter months and when  heat has been humid. 3. Use saline nose sprays frequently 4. Saline irrigations of the nose can be very helpful if done frequently.  * 4X daily for 1 week*  * Use of a nettie pot can be helpful with this. Follow directions with  this* 5. Drink plenty of fluids 6. Keep thermostat turn down low 7.For any cough or congestion  Use plain Mucinex- regular strength or max strength is fine- avoid decongestants   * Children- consult with Pharmacist for dosing 8. For fever or aces or pains- take tylenol or ibuprofen appropriate for age and weight.  * for fevers greater than 101 orally you may alternate ibuprofen and tylenol every  3 hours.    Labs pending Health maintenance reviewed Diet and exercise encouraged Continue all meds Follow up  In 3 month   [place], FNP

## 2014-10-28 ENCOUNTER — Telehealth: Payer: Self-pay | Admitting: Nurse Practitioner

## 2015-01-19 ENCOUNTER — Other Ambulatory Visit: Payer: Self-pay | Admitting: Nurse Practitioner

## 2015-01-20 NOTE — Telephone Encounter (Signed)
Please call in xanax with 1 refills 

## 2015-01-20 NOTE — Telephone Encounter (Signed)
Last filled 11/26/14, last seen 10/05/14. Call into [facility]

## 2015-01-20 NOTE — Telephone Encounter (Signed)
rx called into pharmacy

## 2015-03-15 ENCOUNTER — Ambulatory Visit (INDEPENDENT_AMBULATORY_CARE_PROVIDER_SITE_OTHER): Payer: 59 | Admitting: Physician Assistant

## 2015-03-15 VITALS — BP 121/68 | HR 70 | Temp 96.9°F | Ht 65.0 in | Wt 154.0 lb

## 2015-03-15 DIAGNOSIS — J321 Chronic frontal sinusitis: Secondary | ICD-10-CM

## 2015-03-15 MED ORDER — SULFAMETHOXAZOLE-TRIMETHOPRIM 800-160 MG PO TABS
1.0000 | ORAL_TABLET | Freq: Two times a day (BID) | ORAL | Status: DC
Start: 2015-03-15 — End: 2015-05-12

## 2015-03-15 NOTE — Progress Notes (Signed)
Subjective:     Patient ID: Janet Randolph, female   DOB: [birth year], 54 y.o.   MRN: [id number]  HPI Pt with bilat ear fullness and sinus tenderness She has used Flonase and Claritin but sx cont  Review of Systems  Constitutional: Negative.   HENT: Positive for congestion, hearing loss, postnasal drip, rhinorrhea and sinus pressure. Negative for ear pain.   Respiratory: Negative.   Cardiovascular: Negative.        Objective:   Physical Exam  Constitutional: She appears well-developed and well-nourished.  HENT:  Right Ear: External ear normal.  Left Ear: External ear normal.  Nose: Nose normal.  Mouth/Throat: Oropharynx is clear and moist.  + frontal and maxillary sinus TTP  Neck: Normal range of motion. Neck supple. No JVD present.  Cardiovascular: Normal rate, regular rhythm and normal heart sounds.   Pulmonary/Chest: Effort normal and breath sounds normal.  Lymphadenopathy:    She has no cervical adenopathy.  Nursing note and vitals reviewed.      Assessment:     Sinusitis    Plan:     PCN allergy so Septra DS bid x 10 days Rest Fluids Continue with Flonase and Claritin F/U prn

## 2015-03-15 NOTE — Patient Instructions (Signed)

## 2015-04-23 ENCOUNTER — Other Ambulatory Visit: Payer: Self-pay | Admitting: Nurse Practitioner

## 2015-05-12 ENCOUNTER — Ambulatory Visit (INDEPENDENT_AMBULATORY_CARE_PROVIDER_SITE_OTHER): Payer: 59 | Admitting: Nurse Practitioner

## 2015-05-12 ENCOUNTER — Encounter: Payer: Self-pay | Admitting: Nurse Practitioner

## 2015-05-12 VITALS — BP 128/82 | HR 77 | Temp 96.7°F | Ht 65.0 in | Wt 151.0 lb

## 2015-05-12 DIAGNOSIS — E785 Hyperlipidemia, unspecified: Secondary | ICD-10-CM | POA: Diagnosis not present

## 2015-05-12 DIAGNOSIS — Z1212 Encounter for screening for malignant neoplasm of rectum: Secondary | ICD-10-CM

## 2015-05-12 DIAGNOSIS — I10 Essential (primary) hypertension: Secondary | ICD-10-CM

## 2015-05-12 DIAGNOSIS — F329 Major depressive disorder, single episode, unspecified: Secondary | ICD-10-CM | POA: Diagnosis not present

## 2015-05-12 DIAGNOSIS — F411 Generalized anxiety disorder: Secondary | ICD-10-CM

## 2015-05-12 DIAGNOSIS — F32A Depression, unspecified: Secondary | ICD-10-CM

## 2015-05-12 MED ORDER — ATORVASTATIN CALCIUM 40 MG PO TABS: 40.0000 mg | ORAL_TABLET | Freq: Every day | ORAL | Status: DC

## 2015-05-12 MED ORDER — ALPRAZOLAM 0.5 MG PO TABS: 0.5000 mg | ORAL_TABLET | Freq: Two times a day (BID) | ORAL | Status: DC

## 2015-05-12 MED ORDER — EFFEXOR XR 75 MG PO CP24: ORAL_CAPSULE | ORAL | Status: DC

## 2015-05-12 MED ORDER — LISINOPRIL 20 MG PO TABS: 20.0000 mg | ORAL_TABLET | Freq: Every day | ORAL | Status: DC

## 2015-05-12 NOTE — Progress Notes (Signed)
Subjective:    Patient ID: Janet Randolph, female    DOB: [birth year], 54 y.o.   MRN: [id number]  Patient here today for follow up of chronic medical problems. Forgets to take chronic meds a lot.   Hypertension This is a chronic problem. The current episode started more than 1 year ago. The problem is unchanged. The problem is uncontrolled. Pertinent negatives include no chest pain, headaches, palpitations or shortness of breath. Risk factors for coronary artery disease include dyslipidemia and post-menopausal state. Past treatments include ACE inhibitors. The current treatment provides mild improvement. Compliance problems include diet and exercise.   Hyperlipidemia This is a chronic problem. The current episode started more than 1 year ago. The problem is controlled. Recent lipid tests were reviewed and are normal. She has no history of diabetes or hypothyroidism. Pertinent negatives include no chest pain, myalgias or shortness of breath. Current antihyperlipidemic treatment includes statins. The current treatment provides moderate improvement of lipids. Compliance problems include adherence to diet and adherence to exercise.  Risk factors for coronary artery disease include dyslipidemia, hypertension and post-menopausal.  Depression Patient currently on effexor 75 mg and is doing well. No complaint of side effects. GAD Xanax- takes one every morning- no complaints or problems   Review of Systems  Constitutional: Negative for fever and chills.  HENT: Positive for congestion, rhinorrhea and sinus pressure.   Respiratory: Negative for cough and shortness of breath.   Cardiovascular: Negative for chest pain and palpitations.  Musculoskeletal: Negative for myalgias.  Neurological: Negative for headaches.  Psychiatric/Behavioral: Negative.   All other systems reviewed and are negative.      Objective:   Physical Exam  Constitutional: She is oriented to person, place, and time. She  appears well-developed and well-nourished.  HENT:  Nose: Nose normal.  Mouth/Throat: Oropharynx is clear and moist.  Eyes: EOM are normal.  Neck: Trachea normal, normal range of motion and full passive range of motion without pain. Neck supple. No JVD present. Carotid bruit is not present. No thyromegaly present.  Cardiovascular: Normal rate, regular rhythm, normal heart sounds and intact distal pulses.  Exam reveals no gallop and no friction rub.   No murmur heard. Pulmonary/Chest: Effort normal and breath sounds normal.  Abdominal: Soft. Bowel sounds are normal. She exhibits no distension and no mass. There is no tenderness.  Musculoskeletal: Normal range of motion.  Lymphadenopathy:    She has no cervical adenopathy.  Neurological: She is alert and oriented to person, place, and time. She has normal reflexes.  Skin: Skin is warm and dry.  Psychiatric: She has a normal mood and affect. Her behavior is normal. Judgment and thought content normal.   BP 128/82 mmHg  Pulse 77  Temp(Src) 96.7 F (35.9 C) (Oral)  Ht _0  (1.651 m)  Wt 151 lb (68.493 kg)  BMI 25.13 kg/m2       Assessment & Plan:   1. Essential hypertension Do not add salt to diet - CMP14+EGFR - lisinopril (PRINIVIL,ZESTRIL) 20 MG tablet; Take 1 tablet (20 mg total) by mouth daily.  Dispense: 30 tablet; Refill: 5  2. Depression Stress management - EFFEXOR XR 75 MG 24 hr capsule; TAKE 2 CAPSULES BY MOUTH ONCE A DAY  Dispense: 60 capsule; Refill: 5  3. Hyperlipidemia Low fat diet - Lipid panel - atorvastatin (LIPITOR) 40 MG tablet; Take 1 tablet (40 mg total) by mouth daily.  Dispense: 90 tablet; Refill: 1  4. GAD (generalized anxiety disorder) - ALPRAZolam (XANAX) 0.5 MG  tablet; Take 1 tablet (0.5 mg total) by mouth 2 (two) times daily.  Dispense: 60 tablet; Refill: 1  5. Screening for malignant neoplasm of the rectum - Fecal occult blood, imunochemical; Future    Labs pending Health maintenance  reviewed Diet and exercise encouraged Continue all meds Follow up  In 6 month   [place], FNP

## 2015-05-12 NOTE — Patient Instructions (Signed)
Fat and Cholesterol Control Diet Fat and cholesterol levels in your blood and organs are influenced by your diet. High levels of fat and cholesterol may lead to diseases of the heart, small and large blood vessels, gallbladder, liver, and pancreas. CONTROLLING FAT AND CHOLESTEROL WITH DIET Although exercise and lifestyle factors are important, your diet is key. That is because certain foods are known to raise cholesterol and others to lower it. The goal is to balance foods for their effect on cholesterol and more importantly, to replace saturated and trans fat with other types of fat, such as monounsaturated fat, polyunsaturated fat, and omega-3 fatty acids. On average, a person should consume no more than 15 to 17 g of saturated fat daily. Saturated and trans fats are considered "bad" fats, and they will raise LDL cholesterol. Saturated fats are primarily found in animal products such as meats, butter, and cream. However, that does not mean you need to give up all your favorite foods. Today, there are good tasting, low-fat, low-cholesterol substitutes for most of the things you like to eat. Choose low-fat or nonfat alternatives. Choose round or loin cuts of red meat. These types of cuts are lowest in fat and cholesterol. Chicken (without the skin), fish, veal, and ground turkey breast are great choices. Eliminate fatty meats, such as hot dogs and salami. Even shellfish have little or no saturated fat. Have a 3 oz (85 g) portion when you eat lean meat, poultry, or fish. Trans fats are also called "partially hydrogenated oils." They are oils that have been scientifically manipulated so that they are solid at room temperature resulting in a longer shelf life and improved taste and texture of foods in which they are added. Trans fats are found in stick margarine, some tub margarines, cookies, crackers, and baked goods.  When baking and cooking, oils are a great substitute for butter. The monounsaturated oils are  especially beneficial since it is believed they lower LDL and raise HDL. The oils you should avoid entirely are saturated tropical oils, such as coconut and palm.  Remember to eat a lot from food groups that are naturally free of saturated and trans fat, including fish, fruit, vegetables, beans, grains (barley, rice, couscous, bulgur wheat), and pasta (without cream sauces).  IDENTIFYING FOODS THAT LOWER FAT AND CHOLESTEROL  Soluble fiber may lower your cholesterol. This type of fiber is found in fruits such as apples, vegetables such as broccoli, potatoes, and carrots, legumes such as beans, peas, and lentils, and grains such as barley. Foods fortified with plant sterols (phytosterol) may also lower cholesterol. You should eat at least 2 g per day of these foods for a cholesterol lowering effect.  Read package labels to identify low-saturated fats, trans fat free, and low-fat foods at the supermarket. Select cheeses that have only 2 to 3 g saturated fat per ounce. Use a heart-healthy tub margarine that is free of trans fats or partially hydrogenated oil. When buying baked goods (cookies, crackers), avoid partially hydrogenated oils. Breads and muffins should be made from whole grains (whole-wheat or whole oat flour, instead of "flour" or "enriched flour"). Buy non-creamy canned soups with reduced salt and no added fats.  FOOD PREPARATION TECHNIQUES  Never deep-fry. If you must fry, either stir-fry, which uses very little fat, or use non-stick cooking sprays. When possible, broil, bake, or roast meats, and steam vegetables. Instead of putting butter or margarine on vegetables, use lemon and herbs, applesauce, and cinnamon (for squash and sweet potatoes). Use nonfat   yogurt, salsa, and low-fat dressings for salads.  LOW-SATURATED FAT / LOW-FAT FOOD SUBSTITUTES Meats / Saturated Fat (g)  Avoid: Steak, marbled (3 oz/85 g) / 11 g  Choose: Steak, lean (3 oz/85 g) / 4 g  Avoid: Hamburger (3 oz/85 g) / 7  g  Choose: Hamburger, lean (3 oz/85 g) / 5 g  Avoid: Ham (3 oz/85 g) / 6 g  Choose: Ham, lean cut (3 oz/85 g) / 2.4 g  Avoid: Chicken, with skin, dark meat (3 oz/85 g) / 4 g  Choose: Chicken, skin removed, dark meat (3 oz/85 g) / 2 g  Avoid: Chicken, with skin, light meat (3 oz/85 g) / 2.5 g  Choose: Chicken, skin removed, light meat (3 oz/85 g) / 1 g Dairy / Saturated Fat (g)  Avoid: Whole milk (1 cup) / 5 g  Choose: Low-fat milk, 2% (1 cup) / 3 g  Choose: Low-fat milk, 1% (1 cup) / 1.5 g  Choose: Skim milk (1 cup) / 0.3 g  Avoid: Hard cheese (1 oz/28 g) / 6 g  Choose: Skim milk cheese (1 oz/28 g) / 2 to 3 g  Avoid: Cottage cheese, 4% fat (1 cup) / 6.5 g  Choose: Low-fat cottage cheese, 1% fat (1 cup) / 1.5 g  Avoid: Ice cream (1 cup) / 9 g  Choose: Sherbet (1 cup) / 2.5 g  Choose: Nonfat frozen yogurt (1 cup) / 0.3 g  Choose: Frozen fruit bar / trace  Avoid: Whipped cream (1 tbs) / 3.5 g  Choose: Nondairy whipped topping (1 tbs) / 1 g Condiments / Saturated Fat (g)  Avoid: Mayonnaise (1 tbs) / 2 g  Choose: Low-fat mayonnaise (1 tbs) / 1 g  Avoid: Butter (1 tbs) / 7 g  Choose: Extra light margarine (1 tbs) / 1 g  Avoid: Coconut oil (1 tbs) / 11.8 g  Choose: Olive oil (1 tbs) / 1.8 g  Choose: Corn oil (1 tbs) / 1.7 g  Choose: Safflower oil (1 tbs) / 1.2 g  Choose: Sunflower oil (1 tbs) / 1.4 g  Choose: Soybean oil (1 tbs) / 2.4 g  Choose: Canola oil (1 tbs) / 1 g Document Released: 08/26/2005 Document Revised: 12/21/2012 Document Reviewed: 11/24/2013 [employer] Patient Information 2015 [employer]. This information is not intended to replace advice given to you by your health care provider. Make sure you discuss any questions you have with your health care provider.  

## 2015-05-12 NOTE — Addendum Note (Signed)
Addended by: Bennie Pierini on: 05/12/2015 09:57 AM   Modules accepted: Orders

## 2015-05-13 LAB — LIPID PANEL
Chol/HDL Ratio: 4.7 ratio units — ABNORMAL HIGH (ref 0.0–4.4)
Cholesterol, Total: 230 mg/dL — ABNORMAL HIGH (ref 100–199)
HDL: 49 mg/dL (ref >39)
LDL Calculated: 163 mg/dL — ABNORMAL HIGH (ref 0–99)
Triglycerides: 89 mg/dL (ref 0–149)
VLDL Cholesterol Cal: 18 mg/dL (ref 5–40)

## 2015-05-13 LAB — CMP14+EGFR
ALT: 18 IU/L (ref 0–32)
AST: 23 IU/L (ref 0–40)
Albumin/Globulin Ratio: 2 (ref 1.1–2.5)
Albumin: 4.2 g/dL (ref 3.5–5.5)
Alkaline Phosphatase: 48 IU/L (ref 39–117)
BUN/Creatinine Ratio: 27 — ABNORMAL HIGH (ref 9–23)
BUN: 22 mg/dL (ref 6–24)
Bilirubin Total: <0.2 mg/dL (ref 0.0–1.2)
CO2: 18 mmol/L (ref 18–29)
Calcium: 8.8 mg/dL (ref 8.7–10.2)
Chloride: 106 mmol/L (ref 97–108)
Creatinine, Ser: 0.81 mg/dL (ref 0.57–1.00)
GFR calc Af Amer: 95 mL/min/{1.73_m2} (ref >59)
GFR calc non Af Amer: 82 mL/min/{1.73_m2} (ref >59)
Globulin, Total: 2.1 g/dL (ref 1.5–4.5)
Glucose: 79 mg/dL (ref 65–99)
Potassium: 4.2 mmol/L (ref 3.5–5.2)
Sodium: 143 mmol/L (ref 134–144)
Total Protein: 6.3 g/dL (ref 6.0–8.5)

## 2015-05-29 ENCOUNTER — Encounter: Payer: Self-pay | Admitting: *Deleted

## 2015-07-12 ENCOUNTER — Encounter: Payer: Self-pay | Admitting: Family Medicine

## 2015-07-12 ENCOUNTER — Ambulatory Visit (INDEPENDENT_AMBULATORY_CARE_PROVIDER_SITE_OTHER): Payer: 59 | Admitting: Family Medicine

## 2015-07-12 VITALS — BP 138/88 | HR 84 | Temp 97.1°F | Ht 65.0 in | Wt 153.6 lb

## 2015-07-12 DIAGNOSIS — L309 Dermatitis, unspecified: Secondary | ICD-10-CM | POA: Diagnosis not present

## 2015-07-12 MED ORDER — TRIAMCINOLONE 0.1 % CREAM:EUCERIN CREAM 1:1: 1.0000 "application " | TOPICAL_CREAM | Freq: Three times a day (TID) | CUTANEOUS | Status: DC

## 2015-07-12 MED ORDER — CLOBETASOL PROPIONATE 0.05 % EX OINT: 1.0000 "application " | TOPICAL_OINTMENT | Freq: Two times a day (BID) | CUTANEOUS | Status: DC

## 2015-07-12 NOTE — Progress Notes (Signed)
Subjective:  Patient ID: Janet Randolph, female    DOB: [birth year]  Age: 54 y.o. MRN: [id number]  CC: Eczema   HPI Janet Randolph presents for increasingly pruritic rash on hands and forearms. Has had it all of her life on the hands but this is the first time he got on the forearms. It's getting to be impossible to not scratch at it itches so badly. She's been using a over-the-counter lotion for treatment. She has had desonide in the past. This is generally limited to the hands but she mentions that she did get it on her eyelids at one point in the past.  History Janet Randolph has a past medical history of Hypertension; Depression; Hyperlipidemia; Vitamin D deficiency; and Eczema.   She has past surgical history that includes Hand surgery.   Her family history includes Asthma in her father; Crohn's disease in her father.She reports that she has been smoking.  She has never used smokeless tobacco. She reports that she drinks alcohol. She reports that she does not use illicit drugs.  Current Outpatient Prescriptions on File Prior to Visit  Medication Sig Dispense Refill  . ALPRAZolam (XANAX) 0.5 MG tablet Take 1 tablet (0.5 mg total) by mouth 2 (two) times daily. 60 tablet 1  . atorvastatin (LIPITOR) 40 MG tablet Take 1 tablet (40 mg total) by mouth daily. 90 tablet 1  . desonide (DESOWEN) 0.05 % cream     . EFFEXOR XR 75 MG 24 hr capsule TAKE 2 CAPSULES BY MOUTH ONCE A DAY 60 capsule 5  . lisinopril (PRINIVIL,ZESTRIL) 20 MG tablet Take 1 tablet (20 mg total) by mouth daily. 30 tablet 5   No current facility-administered medications on file prior to visit.    ROS Review of Systems  Constitutional: Negative for fever, activity change and appetite change.  HENT: Negative.   Eyes: Negative.   Respiratory: Negative.  Negative for shortness of breath.   Cardiovascular: Negative.   Musculoskeletal: Negative for arthralgias.  Skin:       See history of present illness    Objective:  BP  138/88 mmHg  Pulse 84  Temp(Src) 97.1 F (36.2 C) (Oral)  Ht 5\' 5"  (1.651 m)  Wt 153 lb 9.6 oz (69.673 kg)  BMI 25.56 kg/m2  SpO2 99%  Physical Exam  Constitutional: She is oriented to person, place, and time. She appears well-developed and well-nourished. No distress.  HENT:  Head: Normocephalic and atraumatic.  Eyes: Pupils are equal, round, and reactive to light.  Neck: Normal range of motion. Neck supple.  Cardiovascular: Normal rate and regular rhythm.   No murmur heard. Pulmonary/Chest: Effort normal and breath sounds normal. No respiratory distress. She has no wheezes.  Musculoskeletal: Normal range of motion.  Neurological: She is alert and oriented to person, place, and time. She displays normal reflexes.  Skin: Skin is warm and dry. Rash (there is excoriated papular eruptions with thick scale each measuring 4 mm to 1 cm scattered over the hands particularly at the heel of the hand bilaterally. There is some stretching up onto forearm on the radial side primarily bilaterally.) noted. She is not diaphoretic.    Assessment & Plan:   Janet Randolph was seen today for eczema.  Diagnoses and all orders for this visit:  Eczema of both hands  Other orders -     clobetasol ointment (TEMOVATE) 0.05 %; Apply 1 application topically 2 (two) times daily. Cover with nitrile gloves at night after application. -  Triamcinolone Acetonide (TRIAMCINOLONE 0.1 % CREAM : EUCERIN) CREA; Apply 1 application topically 3 (three) times daily. To affected areas to moisturize  I am having [name] start on clobetasol ointment and triamcinolone 0.1 % cream : eucerin. I am also having her maintain her desonide, ALPRAZolam, atorvastatin, EFFEXOR XR, and lisinopril.  Meds ordered this encounter  Medications  . clobetasol ointment (TEMOVATE) 0.05 %    Sig: Apply 1 application topically 2 (two) times daily. Cover with nitrile gloves at night after application.    Dispense:  60 g    Refill:  2  .  Triamcinolone Acetonide (TRIAMCINOLONE 0.1 % CREAM : EUCERIN) CREA    Sig: Apply 1 application topically 3 (three) times daily. To affected areas to moisturize    Dispense:  454 each    Refill:  11     Follow-up: Return if symptoms worsen or fail to improve.  Mechele Claude, M.D.

## 2015-07-12 NOTE — Patient Instructions (Signed)
Apply the Temovate/clobetasol in the morning and in the evening. Be sure to cover all of the areas of the hands and forearms. In the evening, you should apply at bedtime and cover right afterward with a nitrile, that is non-latex, glove and sleep with the gloves on.  Through the day you can apply the triamcinolone in eucerin 3 or more times as needed for dryness and itching.  After 2 weeks discontinue the Temovate/clobetasol and continue the Eucerin and triamcinolone preparation for maintenance.  Follow-up in 2 weeks if not much better

## 2015-08-09 ENCOUNTER — Telehealth: Payer: Self-pay | Admitting: Nurse Practitioner

## 2015-10-28 ENCOUNTER — Ambulatory Visit (INDEPENDENT_AMBULATORY_CARE_PROVIDER_SITE_OTHER): Payer: Managed Care, Other (non HMO) | Admitting: Family Medicine

## 2015-10-28 VITALS — Temp 97.4°F | Ht 65.0 in | Wt 147.0 lb

## 2015-10-28 DIAGNOSIS — J0100 Acute maxillary sinusitis, unspecified: Secondary | ICD-10-CM | POA: Diagnosis not present

## 2015-10-28 DIAGNOSIS — J101 Influenza due to other identified influenza virus with other respiratory manifestations: Secondary | ICD-10-CM

## 2015-10-28 DIAGNOSIS — R52 Pain, unspecified: Secondary | ICD-10-CM

## 2015-10-28 LAB — POCT INFLUENZA A/B
Influenza A, POC: NEGATIVE
Influenza B, POC: POSITIVE — AB

## 2015-10-28 MED ORDER — DOXYCYCLINE HYCLATE 100 MG PO TABS: 100.0000 mg | ORAL_TABLET | Freq: Two times a day (BID) | ORAL | Status: DC

## 2015-10-28 NOTE — Progress Notes (Addendum)
   HPI  Patient presents today with acute illness.  She explains that for the past 3-4 days she's had cough, chest tightness, bilateral ear pain, nosebleeds, body aches, and subjective fever.  She's also complaining of severe bilateral maxillary sinus pressure and teeth pain.  She's been taking pro-air and [facility]al[facility]   She denies shortness of breath, and food or fluid intolerance.   PMH: Smoking status noted ROS: Per HPI  Objective: Temp(Src) 97.4 F (36.3 C) (Oral)  Ht  (1.651 m)  Wt 147 lb (66.679 kg)  BMI 24.46 kg/m2 Gen: NAD, alert, cooperative with exam HEENT: NCAT, bilateral maxillary sinus tenderness to palpation, TMs normal bilaterally, nares clear, oropharynx clear CV: RRR, good S1/S2, no murmur Resp: CTABL, no wheezes, non-labored Ext: No edema, warm Neuro: Alert and oriented, No gross deficits  Assessment and plan:  # Acute maxillary sinusitis Treatment doxycycline considering penicillin allergy. Discussed nasal saline gel since she has nosebleeds. Return to clinic with any worsening or failure to improve.  Influenza Outside of treatment window Supportive care  Orders Placed This Encounter  Procedures  . POCT Influenza A/B    Meds ordered this encounter  Medications  . albuterol (PROVENTIL HFA;VENTOLIN HFA) 108 (90 Base) MCG/ACT inhaler    Sig: Inhale into the lungs every 6 (six) hours as needed for wheezing or shortness of breath.  . benzonatate (TESSALON) 100 MG capsule    Sig: Take 100 mg by mouth 3 (three) times daily as needed for cough.  . doxycycline (VIBRA-TABS) 100 MG tablet    Sig: Take 1 tablet (100 mg total) by mouth 2 (two) times daily. 1 po bid    Dispense:  20 tablet    Refill:  0    Murtis Sink, MD [facility] 10/28/2015, 10:19 AM

## 2015-10-28 NOTE — Patient Instructions (Signed)
Great to meet you!  Try [place] nasal saline gel   Take all antibiotics  Eat yogurt once a day while taking it  Sinusitis, Adult Sinusitis is redness, soreness, and puffiness (inflammation) of the air pockets in the bones of your face (sinuses). The redness, soreness, and puffiness can cause air and mucus to get trapped in your sinuses. This can allow germs to grow and cause an infection.  HOME CARE   Drink enough fluids to keep your pee (urine) clear or pale yellow.  Use a humidifier in your home.  Run a hot shower to create steam in the bathroom. Sit in the bathroom with the door closed. Breathe in the steam 3-4 times a day.  Put a warm, moist washcloth on your face 3-4 times a day, or as told by your doctor.  Use salt water sprays (saline sprays) to wet the thick fluid in your nose. This can help the sinuses drain.  Only take medicine as told by your doctor. GET HELP RIGHT AWAY IF:   Your pain gets worse.  You have very bad headaches.  You are sick to your stomach (nauseous).  You throw up (vomit).  You are very sleepy (drowsy) all the time.  Your face is puffy (swollen).  Your vision changes.  You have a stiff neck.  You have trouble breathing. MAKE SURE YOU:   Understand these instructions.  Will watch your condition.  Will get help right away if you are not doing well or get worse.   This information is not intended to replace advice given to you by your health care provider. Make sure you discuss any questions you have with your health care provider.   Document Released: 02/12/2008 Document Revised: 09/16/2014 Document Reviewed: 03/31/2012 Elsevier Interactive Patient Education [employer]

## 2015-11-06 ENCOUNTER — Other Ambulatory Visit: Payer: Self-pay | Admitting: Nurse Practitioner

## 2015-11-06 DIAGNOSIS — F329 Major depressive disorder, single episode, unspecified: Secondary | ICD-10-CM

## 2015-11-06 DIAGNOSIS — F411 Generalized anxiety disorder: Secondary | ICD-10-CM

## 2015-11-06 DIAGNOSIS — F32A Depression, unspecified: Secondary | ICD-10-CM

## 2015-11-06 MED ORDER — EFFEXOR XR 75 MG PO CP24: ORAL_CAPSULE | ORAL | Status: DC

## 2015-11-06 MED ORDER — ALPRAZOLAM 0.5 MG PO TABS: 0.5000 mg | ORAL_TABLET | Freq: Two times a day (BID) | ORAL | Status: DC

## 2015-11-06 NOTE — Telephone Encounter (Signed)
rx called in and pt is aware. 

## 2015-11-06 NOTE — Telephone Encounter (Signed)
Xanax 0.5mg  1 po BID #60 0 refills

## 2015-11-07 ENCOUNTER — Telehealth: Payer: Self-pay

## 2015-11-07 DIAGNOSIS — F329 Major depressive disorder, single episode, unspecified: Secondary | ICD-10-CM

## 2015-11-07 DIAGNOSIS — F32A Depression, unspecified: Secondary | ICD-10-CM

## 2015-11-07 MED ORDER — VENLAFAXINE HCL ER 150 MG PO CP24: 150.0000 mg | ORAL_CAPSULE | Freq: Every day | ORAL | Status: DC

## 2015-11-07 NOTE — Telephone Encounter (Signed)
Her insurance max dose per day is 1 75 mg tablet   Pharmacy saying could try 150 mg capsule????

## 2015-11-07 NOTE — Telephone Encounter (Signed)
I  agree, I sent 150 mg  Capsule.  Murtis Sink, MD [facility] 11/07/2015, 12:02 PM

## 2015-11-09 ENCOUNTER — Ambulatory Visit: Payer: 59 | Admitting: Nurse Practitioner

## 2015-11-28 ENCOUNTER — Ambulatory Visit: Payer: Managed Care, Other (non HMO) | Admitting: Nurse Practitioner

## 2015-12-07 ENCOUNTER — Ambulatory Visit (INDEPENDENT_AMBULATORY_CARE_PROVIDER_SITE_OTHER): Payer: Managed Care, Other (non HMO) | Admitting: Nurse Practitioner

## 2015-12-07 ENCOUNTER — Encounter: Payer: Self-pay | Admitting: Nurse Practitioner

## 2015-12-07 VITALS — BP 136/82 | HR 77 | Temp 97.3°F | Ht 65.0 in | Wt 146.0 lb

## 2015-12-07 DIAGNOSIS — Z1212 Encounter for screening for malignant neoplasm of rectum: Secondary | ICD-10-CM

## 2015-12-07 DIAGNOSIS — E785 Hyperlipidemia, unspecified: Secondary | ICD-10-CM

## 2015-12-07 DIAGNOSIS — F329 Major depressive disorder, single episode, unspecified: Secondary | ICD-10-CM | POA: Diagnosis not present

## 2015-12-07 DIAGNOSIS — I10 Essential (primary) hypertension: Secondary | ICD-10-CM | POA: Diagnosis not present

## 2015-12-07 DIAGNOSIS — F32A Depression, unspecified: Secondary | ICD-10-CM

## 2015-12-07 DIAGNOSIS — F411 Generalized anxiety disorder: Secondary | ICD-10-CM

## 2015-12-07 DIAGNOSIS — Z1159 Encounter for screening for other viral diseases: Secondary | ICD-10-CM

## 2015-12-07 MED ORDER — LISINOPRIL 20 MG PO TABS: 20.0000 mg | ORAL_TABLET | Freq: Every day | ORAL | Status: DC

## 2015-12-07 MED ORDER — VENLAFAXINE HCL ER 150 MG PO CP24: 150.0000 mg | ORAL_CAPSULE | Freq: Every day | ORAL | Status: DC

## 2015-12-07 MED ORDER — ALPRAZOLAM 0.5 MG PO TABS: 0.5000 mg | ORAL_TABLET | Freq: Two times a day (BID) | ORAL | Status: DC

## 2015-12-07 MED ORDER — ATORVASTATIN CALCIUM 40 MG PO TABS: 40.0000 mg | ORAL_TABLET | Freq: Every day | ORAL | Status: DC

## 2015-12-07 NOTE — Progress Notes (Signed)
Subjective:    Patient ID: Janet Randolph, female    DOB: [birth year], 55 y.o.   MRN: [id number]  Patient here today for follow up of chronic medical problems.  Outpatient Encounter Prescriptions as of 12/07/2015  Medication Sig  . albuterol (PROVENTIL HFA;VENTOLIN HFA) 108 (90 Base) MCG/ACT inhaler Inhale into the lungs every 6 (six) hours as needed for wheezing or shortness of breath.  . ALPRAZolam (XANAX) 0.5 MG tablet Take 1 tablet (0.5 mg total) by mouth 2 (two) times daily.  Marland Kitchen atorvastatin (LIPITOR) 40 MG tablet Take 1 tablet (40 mg total) by mouth daily.  Marland Kitchen lisinopril (PRINIVIL,ZESTRIL) 20 MG tablet Take 1 tablet (20 mg total) by mouth daily.  Marland Kitchen venlafaxine XR (EFFEXOR XR) 150 MG 24 hr capsule Take 1 capsule (150 mg total) by mouth daily with breakfast.    Hypertension This is a chronic problem. The current episode started more than 1 year ago. The problem is unchanged. The problem is uncontrolled. Pertinent negatives include no chest pain, headaches, palpitations or shortness of breath. Risk factors for coronary artery disease include dyslipidemia and post-menopausal state. Past treatments include ACE inhibitors. The current treatment provides mild improvement. Compliance problems include diet and exercise.   Hyperlipidemia This is a chronic problem. The current episode started more than 1 year ago. The problem is controlled. Recent lipid tests were reviewed and are normal. She has no history of diabetes or hypothyroidism. Pertinent negatives include no chest pain, myalgias or shortness of breath. Current antihyperlipidemic treatment includes statins. The current treatment provides moderate improvement of lipids. Compliance problems include adherence to diet and adherence to exercise.  Risk factors for coronary artery disease include dyslipidemia, hypertension and post-menopausal.  Depression Patient currently on effexor 75 mg and is doing well. No complaint of side effects. GAD Xanax-  takes one every morning- no complaints or problems  Depression screen [facility] 2/9 12/07/2015 07/12/2015 05/12/2015 10/05/2014 07/19/2014  Decreased Interest 0 0 1 0 0  Down, Depressed, Hopeless 0 0 1 0 0  PHQ - 2 Score 0 0 2 0 0    GAD 7 : Generalized Anxiety Score 12/07/2015  Nervous, Anxious, on Edge 1  Control/stop worrying 1  Worry too much - different things 2  Trouble relaxing 0  Restless 0  Easily annoyed or irritable 0  Afraid - awful might happen 0  Total GAD 7 Score 4  Anxiety Difficulty Not difficult at all     Review of Systems  Constitutional: Negative for fever and chills.  HENT: Positive for congestion, rhinorrhea and sinus pressure.   Respiratory: Negative for cough and shortness of breath.   Cardiovascular: Negative for chest pain and palpitations.  Musculoskeletal: Negative for myalgias.  Neurological: Negative for headaches.  Psychiatric/Behavioral: Negative.   All other systems reviewed and are negative.      Objective:   Physical Exam  Constitutional: She is oriented to person, place, and time. She appears well-developed and well-nourished.  HENT:  Nose: Nose normal.  Mouth/Throat: Oropharynx is clear and moist.  Eyes: EOM are normal.  Neck: Trachea normal, normal range of motion and full passive range of motion without pain. Neck supple. No JVD present. Carotid bruit is not present. No thyromegaly present.  Cardiovascular: Normal rate, regular rhythm, normal heart sounds and intact distal pulses.  Exam reveals no gallop and no friction rub.   No murmur heard. Pulmonary/Chest: Effort normal and breath sounds normal.  Abdominal: Soft. Bowel sounds are normal. She exhibits no distension and no  mass. There is no tenderness.  Musculoskeletal: Normal range of motion.  Lymphadenopathy:    She has no cervical adenopathy.  Neurological: She is alert and oriented to person, place, and time. She has normal reflexes.  Skin: Skin is warm and dry.  Psychiatric: She has  a normal mood and affect. Her behavior is normal. Judgment and thought content normal.    BP 136/82 mmHg  Pulse 77  Temp(Src) 97.3 F (36.3 C) (Oral)  Ht 5' 5"  (1.651 m)  Wt 146 lb (66.225 kg)  BMI 24.30 kg/m2        Assessment & Plan:  1. Need for hepatitis C screening test - Hepatitis C antibody  2. Screening for malignant neoplasm of the rectum - Fecal occult blood, imunochemical; Future  3. Essential hypertension Do not add salt to diet - CMP14+EGFR - lisinopril (PRINIVIL,ZESTRIL) 20 MG tablet; Take 1 tablet (20 mg total) by mouth daily.  Dispense: 30 tablet; Refill: 5  4. Depression Stress management - venlafaxine XR (EFFEXOR XR) 150 MG 24 hr capsule; Take 1 capsule (150 mg total) by mouth daily with breakfast.  Dispense: 30 capsule; Refill: 3  5. Hyperlipidemia Low fat diet - Lipid panel - atorvastatin (LIPITOR) 40 MG tablet; Take 1 tablet (40 mg total) by mouth daily.  Dispense: 90 tablet; Refill: 1  6. GAD (generalized anxiety disorder) Stress management - ALPRAZolam (XANAX) 0.5 MG tablet; Take 1 tablet (0.5 mg total) by mouth 2 (two) times daily.  Dispense: 60 tablet; Refill: 0    Labs pending Health maintenance reviewed Diet and exercise encouraged Continue all meds Follow up  In 6 month   [place], FNP

## 2015-12-07 NOTE — Patient Instructions (Signed)
Stress and Stress Management Stress is a normal reaction to life events. It is what you feel when life demands more than you are used to or more than you can handle. Some stress can be useful. For example, the stress reaction can help you catch the last bus of the day, study for a test, or meet a deadline at work. But stress that occurs too often or for too long can cause problems. It can affect your emotional health and interfere with relationships and normal daily activities. Too much stress can weaken your immune system and increase your risk for physical illness. If you already have a medical problem, stress can make it worse. CAUSES  All sorts of life events may cause stress. An event that causes stress for one person may not be stressful for another person. Major life events commonly cause stress. These may be positive or negative. Examples include losing your job, moving into a new home, getting married, having a baby, or losing a loved one. Less obvious life events may also cause stress, especially if they occur day after day or in combination. Examples include working long hours, driving in traffic, caring for children, being in debt, or being in a difficult relationship. SIGNS AND SYMPTOMS Stress may cause emotional symptoms including, the following:  Anxiety. This is feeling worried, afraid, on edge, overwhelmed, or out of control.  Anger. This is feeling irritated or impatient.  Depression. This is feeling sad, down, helpless, or guilty.  Difficulty focusing, remembering, or making decisions. Stress may cause physical symptoms, including the following:   Aches and pains. These may affect your head, neck, back, stomach, or other areas of your body.  Tight muscles or clenched jaw.  Low energy or trouble sleeping. Stress may cause unhealthy behaviors, including the following:   Eating to feel better (overeating) or skipping meals.  Sleeping too little, too much, or both.  Working  too much or putting off tasks (procrastination).  Smoking, drinking alcohol, or using drugs to feel better. DIAGNOSIS  Stress is diagnosed through an assessment by your health care provider. Your health care provider will ask questions about your symptoms and any stressful life events.Your health care provider will also ask about your medical history and may order blood tests or other tests. Certain medical conditions and medicine can cause physical symptoms similar to stress. Mental illness can cause emotional symptoms and unhealthy behaviors similar to stress. Your health care provider may refer you to a mental health professional for further evaluation.  TREATMENT  Stress management is the recommended treatment for stress.The goals of stress management are reducing stressful life events and coping with stress in healthy ways.  Techniques for reducing stressful life events include the following:  Stress identification. Self-monitor for stress and identify what causes stress for you. These skills may help you to avoid some stressful events.  Time management. Set your priorities, keep a calendar of events, and learn to say "no." These tools can help you avoid making too many commitments. Techniques for coping with stress include the following:  Rethinking the problem. Try to think realistically about stressful events rather than ignoring them or overreacting. Try to find the positives in a stressful situation rather than focusing on the negatives.  Exercise. Physical exercise can release both physical and emotional tension. The key is to find a form of exercise you enjoy and do it regularly.  Relaxation techniques. These relax the body and mind. Examples include yoga, meditation, tai chi, biofeedback, deep  breathing, progressive muscle relaxation, listening to music, being out in nature, journaling, and other hobbies. Again, the key is to find one or more that you enjoy and can do  regularly.  Healthy lifestyle. Eat a balanced diet, get plenty of sleep, and do not smoke. Avoid using alcohol or drugs to relax.  Strong support network. Spend time with family, friends, or other people you enjoy being around.Express your feelings and talk things over with someone you trust. Counseling or talktherapy with a mental health professional may be helpful if you are having difficulty managing stress on your own. Medicine is typically not recommended for the treatment of stress.Talk to your health care provider if you think you need medicine for symptoms of stress. HOME CARE INSTRUCTIONS  Keep all follow-up visits as directed by your health care provider.  Take all medicines as directed by your health care provider. SEEK MEDICAL CARE IF:  Your symptoms get worse or you start having new symptoms.  You feel overwhelmed by your problems and can no longer manage them on your own. SEEK IMMEDIATE MEDICAL CARE IF:  You feel like hurting yourself or someone else.   This information is not intended to replace advice given to you by your health care provider. Make sure you discuss any questions you have with your health care provider.   Document Released: 02/19/2001 Document Revised: 09/16/2014 Document Reviewed: 04/20/2013 Elsevier Interactive Patient Education 2016 Elsevier Inc.  

## 2015-12-08 ENCOUNTER — Other Ambulatory Visit: Payer: Self-pay | Admitting: Nurse Practitioner

## 2015-12-08 LAB — LIPID PANEL
Chol/HDL Ratio: 4.3 ratio units (ref 0.0–4.4)
Cholesterol, Total: 214 mg/dL — ABNORMAL HIGH (ref 100–199)
HDL: 50 mg/dL (ref >39)
LDL Calculated: 152 mg/dL — ABNORMAL HIGH (ref 0–99)
Triglycerides: 62 mg/dL (ref 0–149)
VLDL Cholesterol Cal: 12 mg/dL (ref 5–40)

## 2015-12-08 LAB — CMP14+EGFR
ALT: 13 IU/L (ref 0–32)
AST: 17 IU/L (ref 0–40)
Albumin/Globulin Ratio: 2 (ref 1.2–2.2)
Albumin: 4.1 g/dL (ref 3.5–5.5)
Alkaline Phosphatase: 50 IU/L (ref 39–117)
BUN/Creatinine Ratio: 28 — ABNORMAL HIGH (ref 9–23)
BUN: 21 mg/dL (ref 6–24)
Bilirubin Total: <0.2 mg/dL (ref 0.0–1.2)
CO2: 18 mmol/L (ref 18–29)
Calcium: 8.7 mg/dL (ref 8.7–10.2)
Chloride: 105 mmol/L (ref 96–106)
Creatinine, Ser: 0.74 mg/dL (ref 0.57–1.00)
GFR calc Af Amer: 105 mL/min/{1.73_m2} (ref >59)
GFR calc non Af Amer: 91 mL/min/{1.73_m2} (ref >59)
Globulin, Total: 2.1 g/dL (ref 1.5–4.5)
Glucose: 78 mg/dL (ref 65–99)
Potassium: 4 mmol/L (ref 3.5–5.2)
Sodium: 142 mmol/L (ref 134–144)
Total Protein: 6.2 g/dL (ref 6.0–8.5)

## 2015-12-08 LAB — HEPATITIS C ANTIBODY: Hep C Virus Ab: <0.1 s/co ratio (ref 0.0–0.9)

## 2016-02-19 ENCOUNTER — Other Ambulatory Visit: Payer: Self-pay | Admitting: Nurse Practitioner

## 2016-02-20 NOTE — Telephone Encounter (Signed)
Last seen 12/07/15  MMM  If approved route to nurse to call into [facility]

## 2016-02-29 ENCOUNTER — Encounter: Payer: Self-pay | Admitting: Physician Assistant

## 2016-02-29 ENCOUNTER — Ambulatory Visit (INDEPENDENT_AMBULATORY_CARE_PROVIDER_SITE_OTHER): Payer: BLUE CROSS/BLUE SHIELD | Admitting: Physician Assistant

## 2016-02-29 VITALS — BP 157/85 | HR 80 | Temp 97.0°F | Ht 65.0 in | Wt 149.4 lb

## 2016-02-29 DIAGNOSIS — R1013 Epigastric pain: Secondary | ICD-10-CM

## 2016-02-29 NOTE — Patient Instructions (Signed)
Constipation, Adult Constipation is when a person:  Poops (has a bowel movement) less than 3 times a week.  Has a hard time pooping.  Has poop that is dry, hard, or bigger than normal. HOME CARE   Eat foods with a lot of fiber in them. This includes fruits, vegetables, beans, and whole grains such as brown rice.  Avoid fatty foods and foods with a lot of sugar. This includes french fries, hamburgers, cookies, candy, and soda.  If you are not getting enough fiber from food, take products with added fiber in them (supplements).  Drink enough fluid to keep your pee (urine) clear or pale yellow.  Exercise on a regular basis, or as told by your doctor.  Go to the restroom when you feel like you need to poop. Do not hold it.  Only take medicine as told by your doctor. Do not take medicines that help you poop (laxatives) without talking to your doctor first. GET HELP RIGHT AWAY IF:   You have bright red blood in your poop (stool).  Your constipation lasts more than 4 days or gets worse.  You have belly (abdominal) or butt (rectal) pain.  You have thin poop (as thin as a pencil).  You lose weight, and it cannot be explained. MAKE SURE YOU:   Understand these instructions.  Will watch your condition.  Will get help right away if you are not doing well or get worse.   This information is not intended to replace advice given to you by your health care provider. Make sure you discuss any questions you have with your health care provider.   Document Released: 02/12/2008 Document Revised: 09/16/2014 Document Reviewed: 06/07/2013 Elsevier Interactive Patient Education 2016 Elsevier Inc. Gastroesophageal Reflux Disease, Adult Normally, food travels down the esophagus and stays in the stomach to be digested. However, when a person has gastroesophageal reflux disease (GERD), food and stomach acid move back up into the esophagus. When this happens, the esophagus becomes sore and  inflamed. Over time, GERD can create small holes (ulcers) in the lining of the esophagus.  CAUSES This condition is caused by a problem with the muscle between the esophagus and the stomach (lower esophageal sphincter, or LES). Normally, the LES muscle closes after food passes through the esophagus to the stomach. When the LES is weakened or abnormal, it does not close properly, and that allows food and stomach acid to go back up into the esophagus. The LES can be weakened by certain dietary substances, medicines, and medical conditions, including:  Tobacco use.  Pregnancy.  Having a hiatal hernia.  Heavy alcohol use.  Certain foods and beverages, such as coffee, chocolate, onions, and peppermint. RISK FACTORS This condition is more likely to develop in:  People who have an increased body weight.  People who have connective tissue disorders.  People who use NSAID medicines. SYMPTOMS Symptoms of this condition include:  Heartburn.  Difficult or painful swallowing.  The feeling of having a lump in the throat.  Abitter taste in the mouth.  Bad breath.  Having a large amount of saliva.  Having an upset or bloated stomach.  Belching.  Chest pain.  Shortness of breath or wheezing.  Ongoing (chronic) cough or a night-time cough.  Wearing away of tooth enamel.  Weight loss. Different conditions can cause chest pain. Make sure to see your health care provider if you experience chest pain. DIAGNOSIS Your health care provider will take a medical history and perform a physical exam.  To determine if you have mild or severe GERD, your health care provider may also monitor how you respond to treatment. You may also have other tests, including:  An endoscopy toexamine your stomach and esophagus with a small camera.  A test thatmeasures the acidity level in your esophagus.  A test thatmeasures how much pressure is on your esophagus.  A barium swallow or modified barium  swallow to show the shape, size, and functioning of your esophagus. TREATMENT The goal of treatment is to help relieve your symptoms and to prevent complications. Treatment for this condition may vary depending on how severe your symptoms are. Your health care provider may recommend:  Changes to your diet.  Medicine.  Surgery. HOME CARE INSTRUCTIONS Diet  Follow a diet as recommended by your health care provider. This may involve avoiding foods and drinks such as:  Coffee and tea (with or without caffeine).  Drinks that containalcohol.  Energy drinks and sports drinks.  Carbonated drinks or sodas.  Chocolate and cocoa.  Peppermint and mint flavorings.  Garlic and onions.  Horseradish.  Spicy and acidic foods, including peppers, chili powder, curry powder, vinegar, hot sauces, and barbecue sauce.  Citrus fruit juices and citrus fruits, such as oranges, lemons, and limes.  Tomato-based foods, such as red sauce, chili, salsa, and pizza with red sauce.  Fried and fatty foods, such as donuts, french fries, potato chips, and high-fat dressings.  High-fat meats, such as hot dogs and fatty cuts of red and white meats, such as rib eye steak, sausage, ham, and bacon.  High-fat dairy items, such as whole milk, butter, and cream cheese.  Eat small, frequent meals instead of large meals.  Avoid drinking large amounts of liquid with your meals.  Avoid eating meals during the 2-3 hours before bedtime.  Avoid lying down right after you eat.  Do not exercise right after you eat. General Instructions  Pay attention to any changes in your symptoms.  Take over-the-counter and prescription medicines only as told by your health care provider. Do not take aspirin, ibuprofen, or other NSAIDs unless your health care provider told you to do so.  Do not use any tobacco products, including cigarettes, chewing tobacco, and e-cigarettes. If you need help quitting, ask your health care  provider.  Wear loose-fitting clothing. Do not wear anything tight around your waist that causes pressure on your abdomen.  Raise (elevate) the head of your bed 6 inches (15cm).  Try to reduce your stress, such as with yoga or meditation. If you need help reducing stress, ask your health care provider.  If you are overweight, reduce your weight to an amount that is healthy for you. Ask your health care provider for guidance about a safe weight loss goal.  Keep all follow-up visits as told by your health care provider. This is important. SEEK MEDICAL CARE IF:  You have new symptoms.  You have unexplained weight loss.  You have difficulty swallowing, or it hurts to swallow.  You have wheezing or a persistent cough.  Your symptoms do not improve with treatment.  You have a hoarse voice. SEEK IMMEDIATE MEDICAL CARE IF:  You have pain in your arms, neck, jaw, teeth, or back.  You feel sweaty, dizzy, or light-headed.  You have chest pain or shortness of breath.  You vomit and your vomit looks like blood or coffee grounds.  You faint.  Your stool is bloody or black.  You cannot swallow, drink, or eat.   This  information is not intended to replace advice given to you by your health care provider. Make sure you discuss any questions you have with your health care provider.   Document Released: 06/05/2005 Document Revised: 05/17/2015 Document Reviewed: 12/21/2014 Elsevier Interactive Patient Education [employer]

## 2016-02-29 NOTE — Progress Notes (Signed)
Subjective:     Patient ID: Janet Randolph, female   DOB: [birth year], 55 y.o.   MRN: [id number]  HPI Pt with several episodes of epigastric fullness Episodes will happen after eating usually about 1 hr post meal These sx will spont resolve but the cramping can be severe No OTC meds for sx  Review of Systems  Constitutional: Negative.   Gastrointestinal: Positive for abdominal pain and constipation. Negative for nausea, vomiting, diarrhea, abdominal distention and rectal pain.       Objective:   Physical Exam  Constitutional: She appears well-developed and well-nourished.  HENT:  Mouth/Throat: Oropharynx is clear and moist. No oropharyngeal exudate.  Abdominal: Soft. Bowel sounds are normal. She exhibits no distension and no mass. There is tenderness. There is no rebound and no guarding.  Sl epigastric TTP  Nursing note and vitals reviewed.      Assessment:     1. Abdominal pain, epigastric        Plan:     Fluids OTC Milk of Mag to see if this helps If sx continue trial of OTC Zantac Smaller Meals Stay upright 20min after eating  F/U prn

## 2016-03-06 DIAGNOSIS — D239 Other benign neoplasm of skin, unspecified: Secondary | ICD-10-CM | POA: Diagnosis not present

## 2016-03-06 DIAGNOSIS — D692 Other nonthrombocytopenic purpura: Secondary | ICD-10-CM | POA: Diagnosis not present

## 2016-03-06 DIAGNOSIS — L409 Psoriasis, unspecified: Secondary | ICD-10-CM | POA: Diagnosis not present

## 2016-03-28 ENCOUNTER — Other Ambulatory Visit: Payer: Self-pay | Admitting: *Deleted

## 2016-03-28 ENCOUNTER — Other Ambulatory Visit: Payer: Self-pay

## 2016-03-28 DIAGNOSIS — F32A Depression, unspecified: Secondary | ICD-10-CM

## 2016-03-28 DIAGNOSIS — F329 Major depressive disorder, single episode, unspecified: Secondary | ICD-10-CM

## 2016-03-28 MED ORDER — VENLAFAXINE HCL ER 150 MG PO CP24: 150.0000 mg | ORAL_CAPSULE | Freq: Every day | ORAL | Status: DC

## 2016-04-03 ENCOUNTER — Ambulatory Visit: Payer: BLUE CROSS/BLUE SHIELD | Admitting: Pediatrics

## 2016-04-03 ENCOUNTER — Ambulatory Visit (INDEPENDENT_AMBULATORY_CARE_PROVIDER_SITE_OTHER): Payer: BLUE CROSS/BLUE SHIELD | Admitting: Pediatrics

## 2016-04-03 ENCOUNTER — Encounter: Payer: Self-pay | Admitting: Pediatrics

## 2016-04-03 VITALS — BP 160/98 | HR 75 | Temp 97.9°F | Ht 65.0 in | Wt 148.6 lb

## 2016-04-03 DIAGNOSIS — R1013 Epigastric pain: Secondary | ICD-10-CM

## 2016-04-03 DIAGNOSIS — K219 Gastro-esophageal reflux disease without esophagitis: Secondary | ICD-10-CM | POA: Diagnosis not present

## 2016-04-03 DIAGNOSIS — R1011 Right upper quadrant pain: Secondary | ICD-10-CM

## 2016-04-03 DIAGNOSIS — I10 Essential (primary) hypertension: Secondary | ICD-10-CM

## 2016-04-03 MED ORDER — ESOMEPRAZOLE MAGNESIUM 40 MG PO CPDR: 40.0000 mg | DELAYED_RELEASE_CAPSULE | Freq: Every day | ORAL | 1 refills | Status: DC

## 2016-04-03 NOTE — Progress Notes (Signed)
    Subjective:    Patient ID: IDELL Janet Randolph, female    DOB: 07/29/1961, 55 y.o.   MRN: [id number]  CC: Upper abdominal pain (Right to middle)   HPI: Janet Randolph is a 55 y.o. female presenting for Upper abdominal pain (Right to middle)  Takes zantec twice a day, when she takes it she doesn't have pain Usually taking it after she eats now Does help with the pain some Pain comes on after eating Used to only be epigastric, now often RUQ as well Avoiding greasy foods, seems to help some but not always  Has not taken BP pills today yet No headaches or vision changes, no CP or SOB  Depression screen [facility] 2/9 04/03/2016 02/29/2016 12/07/2015 07/12/2015 05/12/2015  Decreased Interest 0 0 0 0 1  Down, Depressed, Hopeless 0 0 0 0 1  PHQ - 2 Score 0 0 0 0 2     Relevant past medical, surgical, family and social history reviewed and updated as indicated.  Interim medical history since our last visit reviewed. Allergies and medications reviewed and updated.  ROS: Per HPI unless specifically indicated above  History  Smoking Status  . Current Every Day Smoker  . Packs/day: 1.00  Smokeless Tobacco  . Never Used       Objective:    BP (!) 160/98 (BP Location: Right Arm, Cuff Size: Normal)   Pulse 75   Temp 97.9 F (36.6 C) (Oral)   Ht _0  (1.651 m)   Wt 148 lb 9.6 oz (67.4 kg)   BMI 24.73 kg/m   Wt Readings from Last 3 Encounters:  04/03/16 148 lb 9.6 oz (67.4 kg)  02/29/16 149 lb 6.4 oz (67.8 kg)  12/07/15 146 lb (66.2 kg)     Gen: NAD, alert, cooperative with exam, NCAT EYES: EOMI, no scleral injection or icterus ENT:  TMs pearly gray b/l, OP without erythema LYMPH: no cervical LAD CV: NRRR, normal S1/S2, no murmur, distal pulses 2+ b/l Resp: CTABL, no wheezes, normal WOB Abd: +BS, soft, ND. Neg murphys sign, does have tenderness RUQ with palpation, also epigastric area Ext: No edema, warm Neuro: Alert and oriented     Assessment & Plan:    [name] was  seen today for RUQ abdominal pain. Will get ultrasound, labs, eval for gallbladder pathology.  Diagnoses and all orders for this visit:  RUQ pain -     Lipase -     CMP14+EGFR -     US Abdomen Limited RUQ; Future  Abdominal pain, epigastric -     esomeprazole (NEXIUM) 40 MG capsule; Take 1 capsule (40 mg total) by mouth daily. Take 30 min before meal  Essential hypertension Uncontrolled Didn't take meds today Restart meds RTC for repeat BP in 2 weeks  Gastroesophageal reflux disease, esophagitis presence not specified Start PPI, stop H2 blocker   Follow up plan: 2 weeks for BP, sooner if needed  Assunta Found, MD [facility] 04/03/2016, 4:33 PM

## 2016-04-03 NOTE — Patient Instructions (Signed)
Ultrasound Nexium, generic, take 30 min before a meal Labs today

## 2016-04-04 LAB — CMP14+EGFR
ALT: 13 IU/L (ref 0–32)
AST: 17 IU/L (ref 0–40)
Albumin/Globulin Ratio: 1.6 (ref 1.2–2.2)
Albumin: 4 g/dL (ref 3.5–5.5)
Alkaline Phosphatase: 59 IU/L (ref 39–117)
BUN/Creatinine Ratio: 25 — ABNORMAL HIGH (ref 9–23)
BUN: 18 mg/dL (ref 6–24)
Bilirubin Total: <0.2 mg/dL (ref 0.0–1.2)
CO2: 27 mmol/L (ref 18–29)
Calcium: 10.4 mg/dL — ABNORMAL HIGH (ref 8.7–10.2)
Chloride: 99 mmol/L (ref 96–106)
Creatinine, Ser: 0.71 mg/dL (ref 0.57–1.00)
GFR calc Af Amer: 110 mL/min/{1.73_m2} (ref >59)
GFR calc non Af Amer: 96 mL/min/{1.73_m2} (ref >59)
Globulin, Total: 2.5 g/dL (ref 1.5–4.5)
Glucose: 82 mg/dL (ref 65–99)
Potassium: 4 mmol/L (ref 3.5–5.2)
Sodium: 142 mmol/L (ref 134–144)
Total Protein: 6.5 g/dL (ref 6.0–8.5)

## 2016-04-04 LAB — LIPASE: Lipase: 35 U/L (ref 0–59)

## 2016-04-12 ENCOUNTER — Ambulatory Visit (HOSPITAL_COMMUNITY)
Admission: RE | Admit: 2016-04-12 | Discharge: 2016-04-12 | Disposition: A | Discharge status: Home / Self Care | Payer: BLUE CROSS/BLUE SHIELD | Source: Ambulatory Visit | Attending: Pediatrics | Admitting: Pediatrics

## 2016-04-12 DIAGNOSIS — K828 Other specified diseases of gallbladder: Secondary | ICD-10-CM | POA: Insufficient documentation

## 2016-04-12 DIAGNOSIS — R1011 Right upper quadrant pain: Secondary | ICD-10-CM | POA: Insufficient documentation

## 2016-05-19 ENCOUNTER — Other Ambulatory Visit: Payer: Self-pay | Admitting: Family

## 2016-05-20 NOTE — Telephone Encounter (Signed)
Forwarding to PCP/MMM 

## 2016-05-21 NOTE — Telephone Encounter (Signed)
Please call in xanax with 1 refills 

## 2016-05-30 ENCOUNTER — Other Ambulatory Visit: Payer: Self-pay | Admitting: Nurse Practitioner

## 2016-05-30 DIAGNOSIS — I10 Essential (primary) hypertension: Secondary | ICD-10-CM

## 2016-05-31 ENCOUNTER — Other Ambulatory Visit: Payer: Self-pay | Admitting: Pediatrics

## 2016-05-31 DIAGNOSIS — R1013 Epigastric pain: Secondary | ICD-10-CM

## 2016-06-26 ENCOUNTER — Other Ambulatory Visit: Payer: Self-pay

## 2016-06-26 DIAGNOSIS — R1013 Epigastric pain: Secondary | ICD-10-CM

## 2016-06-26 MED ORDER — ESOMEPRAZOLE MAGNESIUM 40 MG PO CPDR: 40.0000 mg | DELAYED_RELEASE_CAPSULE | Freq: Every day | ORAL | 0 refills | Status: DC

## 2016-06-27 ENCOUNTER — Other Ambulatory Visit: Payer: Self-pay | Admitting: *Deleted

## 2016-06-27 ENCOUNTER — Other Ambulatory Visit: Payer: Self-pay | Admitting: Nurse Practitioner

## 2016-06-27 DIAGNOSIS — I10 Essential (primary) hypertension: Secondary | ICD-10-CM

## 2016-06-27 DIAGNOSIS — F32A Depression, unspecified: Secondary | ICD-10-CM

## 2016-06-27 DIAGNOSIS — F329 Major depressive disorder, single episode, unspecified: Secondary | ICD-10-CM

## 2016-06-27 MED ORDER — LISINOPRIL 20 MG PO TABS: 20.0000 mg | ORAL_TABLET | Freq: Every day | ORAL | 0 refills | Status: DC

## 2016-08-16 ENCOUNTER — Other Ambulatory Visit: Payer: Self-pay | Admitting: Nurse Practitioner

## 2016-08-23 ENCOUNTER — Ambulatory Visit (INDEPENDENT_AMBULATORY_CARE_PROVIDER_SITE_OTHER): Payer: BLUE CROSS/BLUE SHIELD | Admitting: Nurse Practitioner

## 2016-08-23 ENCOUNTER — Encounter: Payer: Self-pay | Admitting: Nurse Practitioner

## 2016-08-23 VITALS — BP 133/73 | HR 84 | Temp 97.0°F | Ht 65.0 in | Wt 146.0 lb

## 2016-08-23 DIAGNOSIS — F3342 Major depressive disorder, recurrent, in full remission: Secondary | ICD-10-CM | POA: Diagnosis not present

## 2016-08-23 DIAGNOSIS — Z1212 Encounter for screening for malignant neoplasm of rectum: Secondary | ICD-10-CM | POA: Diagnosis not present

## 2016-08-23 DIAGNOSIS — K219 Gastro-esophageal reflux disease without esophagitis: Secondary | ICD-10-CM | POA: Diagnosis not present

## 2016-08-23 DIAGNOSIS — I10 Essential (primary) hypertension: Secondary | ICD-10-CM | POA: Diagnosis not present

## 2016-08-23 DIAGNOSIS — F411 Generalized anxiety disorder: Secondary | ICD-10-CM | POA: Diagnosis not present

## 2016-08-23 DIAGNOSIS — E782 Mixed hyperlipidemia: Secondary | ICD-10-CM

## 2016-08-23 DIAGNOSIS — Z1211 Encounter for screening for malignant neoplasm of colon: Secondary | ICD-10-CM | POA: Diagnosis not present

## 2016-08-23 MED ORDER — VENLAFAXINE HCL ER 150 MG PO CP24: ORAL_CAPSULE | ORAL | 1 refills | Status: DC

## 2016-08-23 MED ORDER — LISINOPRIL 20 MG PO TABS: 20.0000 mg | ORAL_TABLET | Freq: Every day | ORAL | 1 refills | Status: DC

## 2016-08-23 MED ORDER — ALPRAZOLAM 0.5 MG PO TABS: 0.5000 mg | ORAL_TABLET | Freq: Two times a day (BID) | ORAL | 2 refills | Status: DC | PRN

## 2016-08-23 MED ORDER — ROSUVASTATIN CALCIUM 10 MG PO TABS: 10.0000 mg | ORAL_TABLET | Freq: Every day | ORAL | 1 refills | Status: DC

## 2016-08-23 MED ORDER — ESOMEPRAZOLE MAGNESIUM 40 MG PO CPDR: 40.0000 mg | DELAYED_RELEASE_CAPSULE | Freq: Every day | ORAL | 1 refills | Status: DC

## 2016-08-23 MED ORDER — ATORVASTATIN CALCIUM 40 MG PO TABS: 40.0000 mg | ORAL_TABLET | Freq: Every day | ORAL | 1 refills | Status: DC

## 2016-08-23 NOTE — Progress Notes (Signed)
Subjective:    Patient ID: Janet Randolph, female    DOB: Jan 28, 1961, 55 y.o.   MRN: [id number]  Patient here today for follow up of chronic medical problems.  Outpatient Encounter Prescriptions as of 08/23/2016  Medication Sig  . ALPRAZolam (XANAX) 0.5 MG tablet TAKE 1 TABLET BY MOUTH TWICE A DAY AS NEEDED  . atorvastatin (LIPITOR) 40 MG tablet Take 1 tablet (40 mg total) by mouth daily.  Marland Kitchen esomeprazole (NEXIUM) 40 MG capsule Take 1 capsule (40 mg total) by mouth daily. Take 30 min before meal  . lisinopril (PRINIVIL,ZESTRIL) 20 MG tablet Take 1 tablet (20 mg total) by mouth daily.  Marland Kitchen venlafaxine XR (EFFEXOR-XR) 150 MG 24 hr capsule TAKE 1 CAPSULE (150 MG TOTAL) BY MOUTH DAILY WITH BREAKFAST. MUST BE SEEN    Hypertension  This is a chronic problem. The current episode started more than 1 year ago. The problem is unchanged. The problem is uncontrolled. Pertinent negatives include no chest pain, headaches, palpitations or shortness of breath. Risk factors for coronary artery disease include dyslipidemia and post-menopausal state. Past treatments include ACE inhibitors. The current treatment provides mild improvement. Compliance problems include diet and exercise.   Hyperlipidemia  This is a chronic problem. The current episode started more than 1 year ago. The problem is controlled. Recent lipid tests were reviewed and are normal. She has no history of diabetes or hypothyroidism. Pertinent negatives include no chest pain, myalgias or shortness of breath. Current antihyperlipidemic treatment includes statins. The current treatment provides moderate improvement of lipids. Compliance problems include adherence to diet and adherence to exercise.  Risk factors for coronary artery disease include dyslipidemia, hypertension and post-menopausal.  Depression Patient currently on effexor 75 mg and is doing well. No complaint of side effects. GAD Xanax- takes one every morning- no complaints or  problems   Review of Systems  Constitutional: Negative for chills and fever.  HENT: Positive for congestion, rhinorrhea and sinus pressure.   Respiratory: Negative for cough and shortness of breath.   Cardiovascular: Negative for chest pain and palpitations.  Musculoskeletal: Negative for myalgias.  Neurological: Negative for headaches.  Psychiatric/Behavioral: Negative.   All other systems reviewed and are negative.      Objective:   Physical Exam  Constitutional: She is oriented to person, place, and time. She appears well-developed and well-nourished.  HENT:  Nose: Nose normal.  Mouth/Throat: Oropharynx is clear and moist.  Eyes: EOM are normal.  Neck: Trachea normal, normal range of motion and full passive range of motion without pain. Neck supple. No JVD present. Carotid bruit is not present. No thyromegaly present.  Cardiovascular: Normal rate, regular rhythm, normal heart sounds and intact distal pulses.  Exam reveals no gallop and no friction rub.   No murmur heard. Pulmonary/Chest: Effort normal and breath sounds normal.  Abdominal: Soft. Bowel sounds are normal. She exhibits no distension and no mass. There is no tenderness.  Musculoskeletal: Normal range of motion.  Lymphadenopathy:    She has no cervical adenopathy.  Neurological: She is alert and oriented to person, place, and time. She has normal reflexes.  Skin: Skin is warm and dry.  Psychiatric: She has a normal mood and affect. Her behavior is normal. Judgment and thought content normal.   BP 133/73   Pulse 84   Temp 97 F (36.1 C) (Oral)   Ht 5' 5"  (1.651 m)   Wt 146 lb (66.2 kg)   BMI 24.30 kg/m  Assessment & Plan:  1. Recurrent major depressive disorder, in full remission ([place]) Stress management - venlafaxine XR (EFFEXOR-XR) 150 MG 24 hr capsule; TAKE 1 CAPSULE (150 MG TOTAL) BY MOUTH DAILY WITH BREAKFAST. MUST BE SEEN  Dispense: 90 capsule; Refill: 1  2. Essential hypertension low sodium  diet - lisinopril (PRINIVIL,ZESTRIL) 20 MG tablet; Take 1 tablet (20 mg total) by mouth daily.  Dispense: 90 tablet; Refill: 1 - CMP14+EGFR  3. Mixed hyperlipidemia Low fat diet - atorvastatin (LIPITOR) 40 MG tablet; Take 1 tablet (40 mg total) by mouth daily.  Dispense: 90 tablet; Refill: 1 - Lipid panel  4. GAD (generalized anxiety disorder) Stress managemnet Stop lipitor -crestor 60m 1 po qd #90 1 refill - ALPRAZolam (XANAX) 0.5 MG tablet; Take 1 tablet (0.5 mg total) by mouth 2 (two) times daily as needed.  Dispense: 60 tablet; Refill: 2  5. Gastroesophageal reflux disease, esophagitis presence not specified Avoid spicy foods Do not eat 2 hours prior to bedtime  - esomeprazole (NEXIUM) 40 MG capsule; Take 1 capsule (40 mg total) by mouth daily. Take 30 min before meal  Dispense: 90 capsule; Refill: 1  6. Encounter for colorectal cancer screening - Fecal occult blood, imunochemical; Future   Patient to schedule mammogram Labs pending Health maintenance reviewed Diet and exercise encouraged Continue all meds Follow up  In 6 months   MWest [place] FNP

## 2016-08-23 NOTE — Patient Instructions (Signed)

## 2016-08-24 LAB — CMP14+EGFR
ALT: 7 IU/L (ref 0–32)
AST: 11 IU/L (ref 0–40)
Albumin/Globulin Ratio: 1.7 (ref 1.2–2.2)
Albumin: 4 g/dL (ref 3.5–5.5)
Alkaline Phosphatase: 50 IU/L (ref 39–117)
BUN/Creatinine Ratio: 31 — ABNORMAL HIGH (ref 9–23)
BUN: 23 mg/dL (ref 6–24)
Bilirubin Total: <0.2 mg/dL (ref 0.0–1.2)
CO2: 21 mmol/L (ref 18–29)
Calcium: 8.7 mg/dL (ref 8.7–10.2)
Chloride: 104 mmol/L (ref 96–106)
Creatinine, Ser: 0.75 mg/dL (ref 0.57–1.00)
GFR calc Af Amer: 102 mL/min/{1.73_m2} (ref >59)
GFR calc non Af Amer: 89 mL/min/{1.73_m2} (ref >59)
Globulin, Total: 2.3 g/dL (ref 1.5–4.5)
Glucose: 76 mg/dL (ref 65–99)
Potassium: 4.4 mmol/L (ref 3.5–5.2)
Sodium: 142 mmol/L (ref 134–144)
Total Protein: 6.3 g/dL (ref 6.0–8.5)

## 2016-08-24 LAB — LIPID PANEL
Chol/HDL Ratio: 4.6 ratio units — ABNORMAL HIGH (ref 0.0–4.4)
Cholesterol, Total: 249 mg/dL — ABNORMAL HIGH (ref 100–199)
HDL: 54 mg/dL (ref >39)
LDL Calculated: 179 mg/dL — ABNORMAL HIGH (ref 0–99)
Triglycerides: 80 mg/dL (ref 0–149)
VLDL Cholesterol Cal: 16 mg/dL (ref 5–40)

## 2016-09-12 ENCOUNTER — Ambulatory Visit (INDEPENDENT_AMBULATORY_CARE_PROVIDER_SITE_OTHER): Payer: BLUE CROSS/BLUE SHIELD | Admitting: Nurse Practitioner

## 2016-09-12 ENCOUNTER — Encounter: Payer: Self-pay | Admitting: Nurse Practitioner

## 2016-09-12 VITALS — BP 134/77 | HR 76 | Temp 96.8°F | Ht 65.0 in | Wt 146.0 lb

## 2016-09-12 DIAGNOSIS — J0101 Acute recurrent maxillary sinusitis: Secondary | ICD-10-CM

## 2016-09-12 MED ORDER — AZITHROMYCIN 250 MG PO TABS
ORAL_TABLET | ORAL | 0 refills | Status: DC
Start: 2016-09-12 — End: 2017-02-05

## 2016-09-12 NOTE — Progress Notes (Signed)
Subjective:     Stephania Fragminonya H Vogl is a 56 y.o. female who presents for evaluation of sinus pain. Symptoms include: congestion, cough, facial pain, foul rhinorrhea, headaches, sinus pressure, sore throat and right ear pain. Onset of symptoms was 3 days ago. Symptoms have been gradually worsening since that time. Past history is significant for occasional episodes of bronchitis. Patient is a smoker  (1 ppd x >30 yrs).  The following portions of the patient's history were reviewed and updated as appropriate: allergies, current medications, past family history, past medical history, past social history, past surgical history and problem list.  Review of Systems Pertinent items noted in HPI and remainder of comprehensive ROS otherwise negative.   Objective:    BP 134/77   Pulse 76   Temp (!) 96.8 F (36 C) (Oral)   Ht 5\' 5"  (1.651 m)   Wt 146 lb (66.2 kg)   BMI 24.30 kg/m  General appearance: alert, cooperative and mild distress Eyes: conjunctivae/corneas clear. PERRL, EOM's intact. Fundi benign. Ears: normal TM's and external ear canals both ears Nose: clear discharge, mild congestion, turbinates red, sinus tenderness bilateral Throat: lips, mucosa, and tongue normal; teeth and gums normal Neck: no adenopathy, no carotid bruit, no JVD, supple, symmetrical, trachea midline and thyroid not enlarged, symmetric, no tenderness/mass/nodules Lungs: clear to auscultation bilaterally and dry cough Heart: regular rate and rhythm, S1, S2 normal, no murmur, click, rub or gallop    Assessment:    Acute bacterial sinusitis.    Plan:   1. Take meds as prescribed 2. Use a cool mist humidifier especially during the winter months and when heat has been humid. 3. Use saline nose sprays frequently 4. Saline irrigations of the nose can be very helpful if done frequently.  * 4X daily for 1 week*  * Use of a nettie pot can be helpful with this. Follow directions with this* 5. Drink plenty of fluids 6.  Keep thermostat turn down low 7.For any cough or congestion  Use plain Mucinex- regular strength or max strength is fine   * Children- consult with Pharmacist for dosing 8. For fever or aces or pains- take tylenol or ibuprofen appropriate for age and weight.  * for fevers greater than 101 orally you may alternate ibuprofen and tylenol every  3 hours.   Meds ordered this encounter  Medications  . azithromycin (ZITHROMAX) 250 MG tablet    Sig: Two tablets day one, then one tablet daily next 4 days.    Dispense:  6 tablet    Refill:  0    Order Specific Question:   Supervising Provider    Answer:   Johna SheriffVINCENT, CAROL L [[id number]]   Mary-Margaret Daphine DeutscherMartin, FNP

## 2016-09-12 NOTE — Patient Instructions (Signed)

## 2017-01-06 ENCOUNTER — Encounter: Payer: BLUE CROSS/BLUE SHIELD | Admitting: *Deleted

## 2017-01-06 DIAGNOSIS — Z1231 Encounter for screening mammogram for malignant neoplasm of breast: Secondary | ICD-10-CM | POA: Diagnosis not present

## 2017-01-20 ENCOUNTER — Other Ambulatory Visit: Payer: Self-pay | Admitting: Nurse Practitioner

## 2017-01-20 DIAGNOSIS — F411 Generalized anxiety disorder: Secondary | ICD-10-CM

## 2017-01-21 NOTE — Telephone Encounter (Signed)
Rx called to pharmacy

## 2017-01-21 NOTE — Telephone Encounter (Signed)
Please call in xanax with 1 refills 

## 2017-02-05 ENCOUNTER — Ambulatory Visit (INDEPENDENT_AMBULATORY_CARE_PROVIDER_SITE_OTHER): Payer: BLUE CROSS/BLUE SHIELD | Admitting: Family Medicine

## 2017-02-05 ENCOUNTER — Other Ambulatory Visit: Payer: Self-pay | Admitting: *Deleted

## 2017-02-05 ENCOUNTER — Encounter: Payer: Self-pay | Admitting: Family Medicine

## 2017-02-05 ENCOUNTER — Encounter: Payer: Self-pay | Admitting: *Deleted

## 2017-02-05 VITALS — BP 121/70 | HR 79 | Temp 97.4°F | Ht 65.0 in | Wt 151.6 lb

## 2017-02-05 DIAGNOSIS — J0111 Acute recurrent frontal sinusitis: Secondary | ICD-10-CM

## 2017-02-05 MED ORDER — CEFDINIR 300 MG PO CAPS: 300.0000 mg | ORAL_CAPSULE | Freq: Two times a day (BID) | ORAL | 0 refills | Status: DC

## 2017-02-05 NOTE — Progress Notes (Signed)
BP 121/70   Pulse 79   Temp 97.4 F (36.3 C) (Oral)   Ht 5\' 5"  (1.651 m)   Wt 151 lb 9.6 oz (68.8 kg)   BMI 25.23 kg/m    Subjective:    Patient ID: Janet Randolph, female    DOB: 02/06/1961, 56 y.o.   MRN: [id number]  HPI: Janet Randolph is a 56 y.o. female presenting on 02/05/2017 for Sinus Problem; ear pressure and pain; and Cough (congestion)   HPI Sinus pressure and congestion Patient has been having sinus pressure and congestion and ear pressure and pain and a cough that is nonproductive that's been increasing over the past week. She denies any fevers or chills or shortness of breath or wheezing. She gets this recurrently about 4 times a year. She does admit to smoking 1 pack per day and has tried to quit before but not recently. She denies any sick contacts that she knows of.  Relevant past medical, surgical, family and social history reviewed and updated as indicated. Interim medical history since our last visit reviewed. Allergies and medications reviewed and updated.  Review of Systems  Constitutional: Negative for chills and fever.  HENT: Positive for congestion, postnasal drip, rhinorrhea, sinus pressure, sneezing and sore throat. Negative for ear discharge and ear pain.   Eyes: Negative for pain, redness and visual disturbance.  Respiratory: Positive for cough. Negative for chest tightness, shortness of breath and wheezing.   Cardiovascular: Negative for chest pain and leg swelling.  Genitourinary: Negative for difficulty urinating and dysuria.  Musculoskeletal: Negative for back pain and gait problem.  Skin: Negative for rash.  Neurological: Negative for light-headedness and headaches.  Psychiatric/Behavioral: Negative for agitation and behavioral problems.  All other systems reviewed and are negative.   Per HPI unless specifically indicated above        Objective:    BP 121/70   Pulse 79   Temp 97.4 F (36.3 C) (Oral)   Ht 5\' 5"  (1.651 m)   Wt  151 lb 9.6 oz (68.8 kg)   BMI 25.23 kg/m   Wt Readings from Last 3 Encounters:  02/05/17 151 lb 9.6 oz (68.8 kg)  09/12/16 146 lb (66.2 kg)  08/23/16 146 lb (66.2 kg)    Physical Exam  Constitutional: She is oriented to person, place, and time. She appears well-developed and well-nourished. No distress.  HENT:  Right Ear: Tympanic membrane, external ear and ear canal normal.  Left Ear: Tympanic membrane, external ear and ear canal normal.  Nose: Mucosal edema and rhinorrhea present. No epistaxis. Right sinus exhibits maxillary sinus tenderness and frontal sinus tenderness. Left sinus exhibits maxillary sinus tenderness and frontal sinus tenderness.  Mouth/Throat: Uvula is midline and mucous membranes are normal. Posterior oropharyngeal edema and posterior oropharyngeal erythema present. No oropharyngeal exudate or tonsillar abscesses.  Eyes: Conjunctivae are normal.  Neck: Neck supple.  Cardiovascular: Normal rate, regular rhythm, normal heart sounds and intact distal pulses.   No murmur heard. Pulmonary/Chest: Effort normal and breath sounds normal. No respiratory distress. She has no wheezes. She has no rales.  Musculoskeletal: Normal range of motion. She exhibits no edema or tenderness.  Lymphadenopathy:    She has no cervical adenopathy.  Neurological: She is alert and oriented to person, place, and time. Coordination normal.  Skin: Skin is warm and dry. No rash noted. She is not diaphoretic.  Psychiatric: She has a normal mood and affect. Her behavior is normal.  Vitals reviewed.  Assessment & Plan:   Problem List Items Addressed This Visit    None    Visit Diagnoses    Acute recurrent frontal sinusitis    -  Primary   Recommended smoking cessation and Flonase and Mucinex, will send an Omnicef for   Relevant Medications   cefdinir (OMNICEF) 300 MG capsule       Follow up plan: Return if symptoms worsen or fail to improve.  Counseling provided for all of the  vaccine components No orders of the defined types were placed in this encounter.   Arville Care, MD [facility] 02/05/2017, 2:50 PM

## 2017-02-22 ENCOUNTER — Other Ambulatory Visit: Payer: Self-pay | Admitting: Nurse Practitioner

## 2017-02-22 DIAGNOSIS — E782 Mixed hyperlipidemia: Secondary | ICD-10-CM

## 2017-03-27 ENCOUNTER — Other Ambulatory Visit: Payer: Self-pay | Admitting: Nurse Practitioner

## 2017-03-27 DIAGNOSIS — K219 Gastro-esophageal reflux disease without esophagitis: Secondary | ICD-10-CM

## 2017-03-27 DIAGNOSIS — F3342 Major depressive disorder, recurrent, in full remission: Secondary | ICD-10-CM

## 2017-04-04 ENCOUNTER — Other Ambulatory Visit: Payer: Self-pay | Admitting: Nurse Practitioner

## 2017-04-04 DIAGNOSIS — F3342 Major depressive disorder, recurrent, in full remission: Secondary | ICD-10-CM

## 2017-04-11 ENCOUNTER — Other Ambulatory Visit: Payer: Self-pay | Admitting: Nurse Practitioner

## 2017-04-11 DIAGNOSIS — K219 Gastro-esophageal reflux disease without esophagitis: Secondary | ICD-10-CM

## 2017-05-03 ENCOUNTER — Other Ambulatory Visit: Payer: Self-pay | Admitting: Nurse Practitioner

## 2017-05-03 DIAGNOSIS — I10 Essential (primary) hypertension: Secondary | ICD-10-CM

## 2017-05-05 NOTE — Progress Notes (Signed)
Detailed message left for patient asking to please return FOBT

## 2017-05-19 ENCOUNTER — Other Ambulatory Visit: Payer: Self-pay | Admitting: Nurse Practitioner

## 2017-05-19 DIAGNOSIS — F411 Generalized anxiety disorder: Secondary | ICD-10-CM

## 2017-05-19 NOTE — Telephone Encounter (Signed)
Please call in alprazolam with 1 refills 

## 2017-05-20 NOTE — Telephone Encounter (Signed)
Rx called to pharmacy

## 2017-05-26 ENCOUNTER — Encounter: Payer: Self-pay | Admitting: Nurse Practitioner

## 2017-05-26 ENCOUNTER — Ambulatory Visit (INDEPENDENT_AMBULATORY_CARE_PROVIDER_SITE_OTHER): Payer: BLUE CROSS/BLUE SHIELD | Admitting: Nurse Practitioner

## 2017-05-26 VITALS — BP 138/76 | HR 76 | Temp 98.0°F | Ht 65.0 in | Wt 153.2 lb

## 2017-05-26 DIAGNOSIS — K219 Gastro-esophageal reflux disease without esophagitis: Secondary | ICD-10-CM

## 2017-05-26 DIAGNOSIS — F411 Generalized anxiety disorder: Secondary | ICD-10-CM | POA: Diagnosis not present

## 2017-05-26 DIAGNOSIS — F3342 Major depressive disorder, recurrent, in full remission: Secondary | ICD-10-CM | POA: Diagnosis not present

## 2017-05-26 DIAGNOSIS — I10 Essential (primary) hypertension: Secondary | ICD-10-CM | POA: Diagnosis not present

## 2017-05-26 DIAGNOSIS — E782 Mixed hyperlipidemia: Secondary | ICD-10-CM

## 2017-05-26 MED ORDER — ROSUVASTATIN CALCIUM 10 MG PO TABS: ORAL_TABLET | ORAL | 1 refills | Status: DC

## 2017-05-26 MED ORDER — ESOMEPRAZOLE MAGNESIUM 40 MG PO CPDR: 40.0000 mg | DELAYED_RELEASE_CAPSULE | Freq: Every day | ORAL | 0 refills | Status: DC

## 2017-05-26 MED ORDER — LISINOPRIL 20 MG PO TABS: 20.0000 mg | ORAL_TABLET | Freq: Every day | ORAL | 0 refills | Status: DC

## 2017-05-26 MED ORDER — ALPRAZOLAM 0.5 MG PO TABS: ORAL_TABLET | ORAL | 1 refills | Status: DC

## 2017-05-26 MED ORDER — VENLAFAXINE HCL ER 150 MG PO CP24: ORAL_CAPSULE | ORAL | 1 refills | Status: DC

## 2017-05-26 NOTE — Addendum Note (Signed)
Addended by: Bennie Pierini on: 05/26/2017 04:50 PM   Modules accepted: Orders

## 2017-05-26 NOTE — Patient Instructions (Signed)
Stress and Stress Management Stress is a normal reaction to life events. It is what you feel when life demands more than you are used to or more than you can handle. Some stress can be useful. For example, the stress reaction can help you catch the last bus of the day, study for a test, or meet a deadline at work. But stress that occurs too often or for too long can cause problems. It can affect your emotional health and interfere with relationships and normal daily activities. Too much stress can weaken your immune system and increase your risk for physical illness. If you already have a medical problem, stress can make it worse. What are the causes? All sorts of life events may cause stress. An event that causes stress for one person may not be stressful for another person. Major life events commonly cause stress. These may be positive or negative. Examples include losing your job, moving into a new home, getting married, having a baby, or losing a loved one. Less obvious life events may also cause stress, especially if they occur day after day or in combination. Examples include working long hours, driving in traffic, caring for children, being in debt, or being in a difficult relationship. What are the signs or symptoms? Stress may cause emotional symptoms including, the following:  Anxiety. This is feeling worried, afraid, on edge, overwhelmed, or out of control.  Anger. This is feeling irritated or impatient.  Depression. This is feeling sad, down, helpless, or guilty.  Difficulty focusing, remembering, or making decisions.  Stress may cause physical symptoms, including the following:  Aches and pains. These may affect your head, neck, back, stomach, or other areas of your body.  Tight muscles or clenched jaw.  Low energy or trouble sleeping.  Stress may cause unhealthy behaviors, including the following:  Eating to feel better (overeating) or skipping meals.  Sleeping too little,  too much, or both.  Working too much or putting off tasks (procrastination).  Smoking, drinking alcohol, or using drugs to feel better.  How is this diagnosed? Stress is diagnosed through an assessment by your health care provider. Your health care provider will ask questions about your symptoms and any stressful life events.Your health care provider will also ask about your medical history and may order blood tests or other tests. Certain medical conditions and medicine can cause physical symptoms similar to stress. Mental illness can cause emotional symptoms and unhealthy behaviors similar to stress. Your health care provider may refer you to a mental health professional for further evaluation. How is this treated? Stress management is the recommended treatment for stress.The goals of stress management are reducing stressful life events and coping with stress in healthy ways. Techniques for reducing stressful life events include the following:  Stress identification. Self-monitor for stress and identify what causes stress for you. These skills may help you to avoid some stressful events.  Time management. Set your priorities, keep a calendar of events, and learn to say "no." These tools can help you avoid making too many commitments.  Techniques for coping with stress include the following:  Rethinking the problem. Try to think realistically about stressful events rather than ignoring them or overreacting. Try to find the positives in a stressful situation rather than focusing on the negatives.  Exercise. Physical exercise can release both physical and emotional tension. The key is to find a form of exercise you enjoy and do it regularly.  Relaxation techniques. These relax the body and  mind. Examples include yoga, meditation, tai chi, biofeedback, deep breathing, progressive muscle relaxation, listening to music, being out in nature, journaling, and other hobbies. Again, the key is to find  one or more that you enjoy and can do regularly.  Healthy lifestyle. Eat a balanced diet, get plenty of sleep, and do not smoke. Avoid using alcohol or drugs to relax.  Strong support network. Spend time with family, friends, or other people you enjoy being around.Express your feelings and talk things over with someone you trust.  Counseling or talktherapy with a mental health professional may be helpful if you are having difficulty managing stress on your own. Medicine is typically not recommended for the treatment of stress.Talk to your health care provider if you think you need medicine for symptoms of stress. Follow these instructions at home:  Keep all follow-up visits as directed by your health care provider.  Take all medicines as directed by your health care provider. Contact a health care provider if:  Your symptoms get worse or you start having new symptoms.  You feel overwhelmed by your problems and can no longer manage them on your own. Get help right away if:  You feel like hurting yourself or someone else. This information is not intended to replace advice given to you by your health care provider. Make sure you discuss any questions you have with your health care provider. Document Released: 02/19/2001 Document Revised: 02/01/2016 Document Reviewed: 04/20/2013 Elsevier Interactive Patient Education  2017 [employer]  

## 2017-05-26 NOTE — Progress Notes (Signed)
Subjective:    Patient ID: Janet Randolph, female    DOB: 10-30-1960, 56 y.o.   MRN: [id number]  HPI  Janet Randolph is here today for follow up of chronic medical problem.  Outpatient Encounter Prescriptions as of 05/26/2017  Medication Sig  . ALPRAZolam (XANAX) 0.5 MG tablet TAKE 1 TABLET 2 TIMES A DAY AS NEEDED  . cefdinir (OMNICEF) 300 MG capsule Take 1 capsule (300 mg total) by mouth 2 (two) times daily. 1 po BID  . clobetasol cream (TEMOVATE) 0.05 % APPLY TO AFFECTED AREA EVERY DAY TWICE A DAY WITH MOIST CLOTH FOR 30 MINS. REMOVE CLOTH AND DRY SKIN  . esomeprazole (NEXIUM) 40 MG capsule TAKE 1 CAPSULE (40 MG TOTAL) BY MOUTH DAILY. TAKE 30 MIN BEFORE MEAL  . lisinopril (PRINIVIL,ZESTRIL) 20 MG tablet TAKE 1 TABLET (20 MG TOTAL) BY MOUTH DAILY.  . rosuvastatin (CRESTOR) 10 MG tablet TAKE 1 TABLET (10 MG TOTAL) BY MOUTH DAILY. STOP LIPITOR  . venlafaxine XR (EFFEXOR-XR) 150 MG 24 hr capsule TAKE 1 CAPSULE (150 MG TOTAL) BY MOUTH DAILY WITH BREAKFAST. MUST BE SEEN   No facility-administered encounter medications on file as of 05/26/2017.     1. Essential hypertension  No c/o chest , SOB or headache. Does not check blood pressure at home  2. Gastroesophageal reflux disease, esophagitis presence not specified  Takes nexium daily which works well to keep symptoms under control  3. Mixed hyperlipidemia  Does not watch diet  4. GAD (generalized anxiety disorder)  Takes xanax BID- says she gets very anxious if does not take  5. Recurrent major depressive disorder, in full remission ([facility])  effexor is working well for her    New complaints: None today  Social history:    Review of Systems  Constitutional: Negative for activity change and appetite change.  HENT: Negative.   Eyes: Negative for pain.  Respiratory: Negative for shortness of breath.   Cardiovascular: Negative for chest pain, palpitations and leg swelling.  Gastrointestinal: Negative for abdominal pain.    Endocrine: Negative for polydipsia.  Genitourinary: Negative.   Skin: Negative for rash.  Neurological: Negative for dizziness, weakness and headaches.  Hematological: Does not bruise/bleed easily.  Psychiatric/Behavioral: Negative.   All other systems reviewed and are negative.      Objective:   Physical Exam  Constitutional: She is oriented to person, place, and time. She appears well-developed and well-nourished.  HENT:  Nose: Nose normal.  Mouth/Throat: Oropharynx is clear and moist.  Eyes: EOM are normal.  Neck: Trachea normal, normal range of motion and full passive range of motion without pain. Neck supple. No JVD present. Carotid bruit is not present. No thyromegaly present.  Cardiovascular: Normal rate, regular rhythm, normal heart sounds and intact distal pulses.  Exam reveals no gallop and no friction rub.   No murmur heard. Pulmonary/Chest: Effort normal and breath sounds normal.  Abdominal: Soft. Bowel sounds are normal. She exhibits no distension and no mass. There is no tenderness.  Musculoskeletal: Normal range of motion.  Lymphadenopathy:    She has no cervical adenopathy.  Neurological: She is alert and oriented to person, place, and time. She has normal reflexes.  Skin: Skin is warm and dry.  Psychiatric: She has a normal mood and affect. Her behavior is normal. Judgment and thought content normal.    BP 138/76   Pulse 76   Temp 98 F (36.7 C) (Oral)   Ht  (1.651 m)   Wt 153  lb 3.2 oz (69.5 kg)   BMI 25.49 kg/m        Assessment & Plan:  1. Essential hypertension Low sodium diet - lisinopril (PRINIVIL,ZESTRIL) 20 MG tablet; Take 1 tablet (20 mg total) by mouth daily.  Dispense: 90 tablet; Refill: 0  2. Gastroesophageal reflux disease, esophagitis presence not specified Avoid spicy foods Do not eat 2 hours prior to bedtime - esomeprazole (NEXIUM) 40 MG capsule; Take 1 capsule (40 mg total) by mouth daily. Take 30 min before meal  Dispense: 90  capsule; Refill: 0  3. Mixed hyperlipidemia Low fat diet - rosuvastatin (CRESTOR) 10 MG tablet; TAKE 1 TABLET (10 MG TOTAL) BY MOUTH DAILY. STOP LIPITOR  Dispense: 90 tablet; Refill: 1  4. GAD (generalized anxiety disorder) Stress management  5. Recurrent major depressive disorder, in full remission ([facility]) Continue effexor - venlafaxine XR (EFFEXOR-XR) 150 MG 24 hr capsule; TAKE 1 CAPSULE (150 MG TOTAL) BY MOUTH DAILY WITH BREAKFAST. MUST BE SEEN  Dispense: 90 capsule; Refill: 1    Labs pending Health maintenance reviewed Diet and exercise encouraged Continue all meds Follow up  In 6 month   Mary-Margaret Daphine Deutscher, FNP

## 2017-05-27 LAB — CMP14+EGFR
ALT: 9 IU/L (ref 0–32)
AST: 14 IU/L (ref 0–40)
Albumin/Globulin Ratio: 2.3 — ABNORMAL HIGH (ref 1.2–2.2)
Albumin: 4.2 g/dL (ref 3.5–5.5)
Alkaline Phosphatase: 45 IU/L (ref 39–117)
BUN/Creatinine Ratio: 28 — ABNORMAL HIGH (ref 9–23)
BUN: 21 mg/dL (ref 6–24)
Bilirubin Total: <0.2 mg/dL (ref 0.0–1.2)
CO2: 21 mmol/L (ref 20–29)
Calcium: 9.1 mg/dL (ref 8.7–10.2)
Chloride: 107 mmol/L — ABNORMAL HIGH (ref 96–106)
Creatinine, Ser: 0.76 mg/dL (ref 0.57–1.00)
GFR calc Af Amer: 101 mL/min/{1.73_m2} (ref >59)
GFR calc non Af Amer: 87 mL/min/{1.73_m2} (ref >59)
Globulin, Total: 1.8 g/dL (ref 1.5–4.5)
Glucose: 81 mg/dL (ref 65–99)
Potassium: 4.1 mmol/L (ref 3.5–5.2)
Sodium: 142 mmol/L (ref 134–144)
Total Protein: 6 g/dL (ref 6.0–8.5)

## 2017-05-27 LAB — LIPID PANEL
Chol/HDL Ratio: 3.1 ratio (ref 0.0–4.4)
Cholesterol, Total: 150 mg/dL (ref 100–199)
HDL: 48 mg/dL (ref >39)
LDL Calculated: 73 mg/dL (ref 0–99)
Triglycerides: 147 mg/dL (ref 0–149)
VLDL Cholesterol Cal: 29 mg/dL (ref 5–40)

## 2017-06-20 DIAGNOSIS — Z205 Contact with and (suspected) exposure to viral hepatitis: Secondary | ICD-10-CM | POA: Diagnosis not present

## 2017-06-23 ENCOUNTER — Ambulatory Visit (INDEPENDENT_AMBULATORY_CARE_PROVIDER_SITE_OTHER): Payer: BLUE CROSS/BLUE SHIELD | Admitting: Nurse Practitioner

## 2017-06-23 ENCOUNTER — Encounter: Payer: Self-pay | Admitting: Nurse Practitioner

## 2017-06-23 VITALS — BP 137/85 | HR 75 | Temp 97.0°F | Ht 65.0 in | Wt 151.0 lb

## 2017-06-23 DIAGNOSIS — F411 Generalized anxiety disorder: Secondary | ICD-10-CM

## 2017-06-23 DIAGNOSIS — K219 Gastro-esophageal reflux disease without esophagitis: Secondary | ICD-10-CM | POA: Diagnosis not present

## 2017-06-23 DIAGNOSIS — F3342 Major depressive disorder, recurrent, in full remission: Secondary | ICD-10-CM | POA: Diagnosis not present

## 2017-06-23 DIAGNOSIS — Z01419 Encounter for gynecological examination (general) (routine) without abnormal findings: Secondary | ICD-10-CM

## 2017-06-23 DIAGNOSIS — I10 Essential (primary) hypertension: Secondary | ICD-10-CM | POA: Diagnosis not present

## 2017-06-23 DIAGNOSIS — Z0000 Encounter for general adult medical examination without abnormal findings: Secondary | ICD-10-CM

## 2017-06-23 DIAGNOSIS — E782 Mixed hyperlipidemia: Secondary | ICD-10-CM

## 2017-06-23 LAB — URINALYSIS, COMPLETE
Bilirubin, UA: NEGATIVE
Glucose, UA: NEGATIVE
Ketones, UA: NEGATIVE
Leukocytes, UA: NEGATIVE
Nitrite, UA: NEGATIVE
Protein, UA: NEGATIVE
Specific Gravity, UA: 1.025 (ref 1.005–1.030)
Urobilinogen, Ur: 0.2 mg/dL (ref 0.2–1.0)
pH, UA: 6.5 (ref 5.0–7.5)

## 2017-06-23 LAB — MICROSCOPIC EXAMINATION: Renal Epithel, UA: NONE SEEN /hpf

## 2017-06-23 MED ORDER — LISINOPRIL 20 MG PO TABS: 20.0000 mg | ORAL_TABLET | Freq: Every day | ORAL | 0 refills | Status: DC

## 2017-06-23 MED ORDER — ESOMEPRAZOLE MAGNESIUM 40 MG PO CPDR: 40.0000 mg | DELAYED_RELEASE_CAPSULE | Freq: Every day | ORAL | 0 refills | Status: DC

## 2017-06-23 MED ORDER — VENLAFAXINE HCL ER 150 MG PO CP24: ORAL_CAPSULE | ORAL | 1 refills | Status: DC

## 2017-06-23 MED ORDER — ROSUVASTATIN CALCIUM 10 MG PO TABS: ORAL_TABLET | ORAL | 1 refills | Status: DC

## 2017-06-23 MED ORDER — DESONIDE 0.05 % EX CREA: TOPICAL_CREAM | Freq: Two times a day (BID) | CUTANEOUS | 2 refills | Status: DC

## 2017-06-23 NOTE — Progress Notes (Signed)
Subjective:    Patient ID: CHARRISSE Randolph, female    DOB: 08-15-1961, 56 y.o.   MRN: [id number]  HPI   Janet Randolph is here today for follow up of chronic medical problem.  Outpatient Encounter Prescriptions as of 06/23/2017  Medication Sig  . ALPRAZolam (XANAX) 0.5 MG tablet TAKE 1 TABLET 2 TIMES A DAY AS NEEDED  . clobetasol cream (TEMOVATE) 0.05 % APPLY TO AFFECTED AREA EVERY DAY TWICE A DAY WITH MOIST CLOTH FOR 30 MINS. REMOVE CLOTH AND DRY SKIN  . esomeprazole (NEXIUM) 40 MG capsule Take 1 capsule (40 mg total) by mouth daily. Take 30 min before meal  . lisinopril (PRINIVIL,ZESTRIL) 20 MG tablet Take 1 tablet (20 mg total) by mouth daily.  . rosuvastatin (CRESTOR) 10 MG tablet TAKE 1 TABLET (10 MG TOTAL) BY MOUTH DAILY. STOP LIPITOR  . venlafaxine XR (EFFEXOR-XR) 150 MG 24 hr capsule TAKE 1 CAPSULE (150 MG TOTAL) BY MOUTH DAILY WITH BREAKFAST. MUST BE SEEN     1. Annual physical exam   2. Gynecologic exam normal  No problems that she is aware of  3. Essential hypertension  No c/o chest pain, SOB or headaches. Does not check pressure at home. BP Readings from Last 3 Encounters:  06/23/17 137/85  05/26/17 138/76  02/05/17 121/70     4. Gastroesophageal reflux disease, esophagitis presence not specified  Takes omeprazole daily which keeps symptoms under control  5. Mixed hyperlipidemia  No problems- does not watch diet  6. GAD (generalized anxiety disorder)  Takes xanax 1 every morning GAD 7 : Generalized Anxiety Score 06/23/2017 12/07/2015  Nervous, Anxious, on Edge 1 1  Control/stop worrying 1 1  Worry too much - different things 1 2  Trouble relaxing 0 0  Restless 1 0  Easily annoyed or irritable 0 0  Afraid - awful might happen 0 0  Total GAD 7 Score 4 4  Anxiety Difficulty Somewhat difficult Not difficult at all      7. Recurrent major depressive disorder, in full remission ([place])  Currently on effexor and she says it works well for her. 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  HENT: Negative.   Eyes: Negative for pain.  Respiratory: Negative for shortness of breath.   Cardiovascular: Negative for chest pain, palpitations and leg swelling.  Gastrointestinal: Negative for abdominal pain.  Endocrine: Negative for polydipsia.  Genitourinary: Negative.   Skin: Negative for rash.  Neurological: Negative for dizziness, weakness and headaches.  Hematological: Does not bruise/bleed easily.  Psychiatric/Behavioral: Negative.   All other systems reviewed and are negative.      Objective:   Physical Exam  Constitutional: She is oriented to person, place, and time. She appears well-developed and well-nourished.  HENT:  Head: Normocephalic.  Right Ear: Hearing, tympanic membrane, external ear  and ear canal normal.  Left Ear: Hearing, tympanic membrane, external ear and ear canal normal.  Nose: Nose normal.  Mouth/Throat: Uvula is midline and oropharynx is clear and moist.  Eyes: Pupils are equal, round, and reactive to light. Conjunctivae and EOM are normal.  Neck: Trachea normal, normal range of motion and full passive range of motion without pain. Neck supple. No JVD present. Carotid bruit is not present. No thyroid mass and no thyromegaly present.  Cardiovascular: Normal rate, regular rhythm, normal heart sounds and intact distal pulses.  Exam reveals no gallop and no friction rub.     No murmur heard. Pulmonary/Chest: Effort normal and breath sounds normal. Right breast exhibits no inverted nipple, no mass, no nipple discharge, no skin change and no tenderness. Left breast exhibits no inverted nipple, no mass, no nipple discharge, no skin change and no tenderness.  Abdominal: Soft. Bowel sounds are normal. She exhibits no distension and no mass. There is no tenderness.  Genitourinary: Uterus normal. Rectal exam shows guaiac negative stool. No breast swelling, tenderness, discharge or bleeding. Vaginal discharge found.  Genitourinary Comments: bimanual exam-No adnexal masses or tenderness. Cervix parous and pink  Musculoskeletal: Normal range of motion.  Lymphadenopathy:    She has no cervical adenopathy.  Neurological: She is alert and oriented to person, place, and time. She has normal reflexes.  Skin: Skin is warm and dry.  Psychiatric: She has a normal mood and affect. Her behavior is normal. Judgment and thought content normal.   BP 137/85   Pulse 75   Temp (!) 97 F (36.1 C) (Oral)   Ht _0  (1.651 m)   Wt 151 lb (68.5 kg)   BMI 25.13 kg/m        Assessment & Plan:  1. Annual physical exam - Urinalysis, Complete - CBC with Differential/Platelet - Thyroid Panel With TSH  2. Gynecologic exam normal - IGP, Aptima HPV, rfx 16/18,45  3. Essential hypertension Low sodium diet - lisinopril (PRINIVIL,ZESTRIL) 20 MG tablet; Take 1 tablet (20 mg total) by mouth daily.  Dispense: 90 tablet; Refill: 0 - CMP14+EGFR  4. Gastroesophageal reflux disease, esophagitis presence not specified Avoid spicy foods Do not eat 2 hours prior to bedtime - esomeprazole (NEXIUM) 40 MG capsule; Take 1 capsule (40 mg total) by mouth daily. Take 30 min before meal  Dispense: 90 capsule; Refill: 0  5. Mixed hyperlipidemia Low fat diet - rosuvastatin (CRESTOR) 10 MG tablet; TAKE 1 TABLET (10 MG TOTAL) BY MOUTH DAILY. STOP LIPITOR  Dispense: 90 tablet; Refill: 1 - Lipid  panel  6. GAD (generalized anxiety disorder) Stress management  7. Recurrent major depressive disorder, in full remission ([facility]) - venlafaxine XR (EFFEXOR-XR) 150 MG 24 hr capsule; TAKE 1 CAPSULE (150 MG TOTAL) BY MOUTH DAILY WITH BREAKFAST. MUST BE SEEN  Dispense: 90 capsule; Refill: 1    Labs pending Health maintenance reviewed Diet and exercise encouraged Continue all meds Follow up  In 6 month   [place], FNP

## 2017-06-23 NOTE — Patient Instructions (Signed)
Stress and Stress Management Stress is a normal reaction to life events. It is what you feel when life demands more than you are used to or more than you can handle. Some stress can be useful. For example, the stress reaction can help you catch the last bus of the day, study for a test, or meet a deadline at work. But stress that occurs too often or for too long can cause problems. It can affect your emotional health and interfere with relationships and normal daily activities. Too much stress can weaken your immune system and increase your risk for physical illness. If you already have a medical problem, stress can make it worse. What are the causes? All sorts of life events may cause stress. An event that causes stress for one person may not be stressful for another person. Major life events commonly cause stress. These may be positive or negative. Examples include losing your job, moving into a new home, getting married, having a baby, or losing a loved one. Less obvious life events may also cause stress, especially if they occur day after day or in combination. Examples include working long hours, driving in traffic, caring for children, being in debt, or being in a difficult relationship. What are the signs or symptoms? Stress may cause emotional symptoms including, the following:  Anxiety. This is feeling worried, afraid, on edge, overwhelmed, or out of control.  Anger. This is feeling irritated or impatient.  Depression. This is feeling sad, down, helpless, or guilty.  Difficulty focusing, remembering, or making decisions.  Stress may cause physical symptoms, including the following:  Aches and pains. These may affect your head, neck, back, stomach, or other areas of your body.  Tight muscles or clenched jaw.  Low energy or trouble sleeping.  Stress may cause unhealthy behaviors, including the following:  Eating to feel better (overeating) or skipping meals.  Sleeping too little,  too much, or both.  Working too much or putting off tasks (procrastination).  Smoking, drinking alcohol, or using drugs to feel better.  How is this diagnosed? Stress is diagnosed through an assessment by your health care provider. Your health care provider will ask questions about your symptoms and any stressful life events.Your health care provider will also ask about your medical history and may order blood tests or other tests. Certain medical conditions and medicine can cause physical symptoms similar to stress. Mental illness can cause emotional symptoms and unhealthy behaviors similar to stress. Your health care provider may refer you to a mental health professional for further evaluation. How is this treated? Stress management is the recommended treatment for stress.The goals of stress management are reducing stressful life events and coping with stress in healthy ways. Techniques for reducing stressful life events include the following:  Stress identification. Self-monitor for stress and identify what causes stress for you. These skills may help you to avoid some stressful events.  Time management. Set your priorities, keep a calendar of events, and learn to say "no." These tools can help you avoid making too many commitments.  Techniques for coping with stress include the following:  Rethinking the problem. Try to think realistically about stressful events rather than ignoring them or overreacting. Try to find the positives in a stressful situation rather than focusing on the negatives.  Exercise. Physical exercise can release both physical and emotional tension. The key is to find a form of exercise you enjoy and do it regularly.  Relaxation techniques. These relax the body and  mind. Examples include yoga, meditation, tai chi, biofeedback, deep breathing, progressive muscle relaxation, listening to music, being out in nature, journaling, and other hobbies. Again, the key is to find  one or more that you enjoy and can do regularly.  Healthy lifestyle. Eat a balanced diet, get plenty of sleep, and do not smoke. Avoid using alcohol or drugs to relax.  Strong support network. Spend time with family, friends, or other people you enjoy being around.Express your feelings and talk things over with someone you trust.  Counseling or talktherapy with a mental health professional may be helpful if you are having difficulty managing stress on your own. Medicine is typically not recommended for the treatment of stress.Talk to your health care provider if you think you need medicine for symptoms of stress. Follow these instructions at home:  Keep all follow-up visits as directed by your health care provider.  Take all medicines as directed by your health care provider. Contact a health care provider if:  Your symptoms get worse or you start having new symptoms.  You feel overwhelmed by your problems and can no longer manage them on your own. Get help right away if:  You feel like hurting yourself or someone else. This information is not intended to replace advice given to you by your health care provider. Make sure you discuss any questions you have with your health care provider. Document Released: 02/19/2001 Document Revised: 02/01/2016 Document Reviewed: 04/20/2013 Elsevier Interactive Patient Education  2017 [employer]  

## 2017-06-23 NOTE — Addendum Note (Signed)
Addended by: Bennie Pierini on: 06/23/2017 10:20 AM   Modules accepted: Orders

## 2017-06-24 LAB — CMP14+EGFR
ALT: 10 IU/L (ref 0–32)
AST: 20 IU/L (ref 0–40)
Albumin/Globulin Ratio: 2.3 — ABNORMAL HIGH (ref 1.2–2.2)
Albumin: 4.2 g/dL (ref 3.5–5.5)
Alkaline Phosphatase: 45 IU/L (ref 39–117)
BUN/Creatinine Ratio: 25 — ABNORMAL HIGH (ref 9–23)
BUN: 18 mg/dL (ref 6–24)
Bilirubin Total: <0.2 mg/dL (ref 0.0–1.2)
CO2: 24 mmol/L (ref 20–29)
Calcium: 8.9 mg/dL (ref 8.7–10.2)
Chloride: 106 mmol/L (ref 96–106)
Creatinine, Ser: 0.71 mg/dL (ref 0.57–1.00)
GFR calc Af Amer: 109 mL/min/{1.73_m2} (ref >59)
GFR calc non Af Amer: 94 mL/min/{1.73_m2} (ref >59)
Globulin, Total: 1.8 g/dL (ref 1.5–4.5)
Glucose: 76 mg/dL (ref 65–99)
Potassium: 3.8 mmol/L (ref 3.5–5.2)
Sodium: 144 mmol/L (ref 134–144)
Total Protein: 6 g/dL (ref 6.0–8.5)

## 2017-06-24 LAB — CBC WITH DIFFERENTIAL/PLATELET
Basophils Absolute: 0 10*3/uL (ref 0.0–0.2)
Basos: 1 %
EOS (ABSOLUTE): 0.1 10*3/uL (ref 0.0–0.4)
Eos: 3 %
Hematocrit: 37.3 % (ref 34.0–46.6)
Hemoglobin: 12.3 g/dL (ref 11.1–15.9)
Immature Grans (Abs): 0 10*3/uL (ref 0.0–0.1)
Immature Granulocytes: 0 %
Lymphocytes Absolute: 1.6 10*3/uL (ref 0.7–3.1)
Lymphs: 33 %
MCH: 33 pg (ref 26.6–33.0)
MCHC: 33 g/dL (ref 31.5–35.7)
MCV: 100 fL — ABNORMAL HIGH (ref 79–97)
Monocytes Absolute: 0.5 10*3/uL (ref 0.1–0.9)
Monocytes: 9 %
Neutrophils Absolute: 2.7 10*3/uL (ref 1.4–7.0)
Neutrophils: 54 %
Platelets: 355 10*3/uL (ref 150–379)
RBC: 3.73 x10E6/uL — ABNORMAL LOW (ref 3.77–5.28)
RDW: 14.1 % (ref 12.3–15.4)
WBC: 5 10*3/uL (ref 3.4–10.8)

## 2017-06-24 LAB — THYROID PANEL WITH TSH
Free Thyroxine Index: 0.8 — ABNORMAL LOW (ref 1.2–4.9)
T3 Uptake Ratio: 11 % — ABNORMAL LOW (ref 24–39)
T4, Total: 6.9 ug/dL (ref 4.5–12.0)
TSH: 1.67 u[IU]/mL (ref 0.450–4.500)

## 2017-06-24 LAB — LIPID PANEL
Chol/HDL Ratio: 2.9 ratio (ref 0.0–4.4)
Cholesterol, Total: 146 mg/dL (ref 100–199)
HDL: 51 mg/dL (ref >39)
LDL Calculated: 75 mg/dL (ref 0–99)
Triglycerides: 102 mg/dL (ref 0–149)
VLDL Cholesterol Cal: 20 mg/dL (ref 5–40)

## 2017-06-25 ENCOUNTER — Other Ambulatory Visit: Payer: Self-pay | Admitting: Nurse Practitioner

## 2017-06-25 DIAGNOSIS — F3342 Major depressive disorder, recurrent, in full remission: Secondary | ICD-10-CM

## 2017-06-26 LAB — IGP, APTIMA HPV, RFX 16/18,45
HPV Aptima: NEGATIVE
PAP Smear Comment: 0

## 2017-09-27 IMAGING — US US ABDOMEN LIMITED
1 series · 14 of 25 positions shown · non-contrast
Comparison: None.

CLINICAL DATA: Three month history of postprandial right upper
quadrant abdominal pain.

EXAM:
US ABDOMEN LIMITED - RIGHT UPPER QUADRANT

[Series 1: us abdomen limited · 0.16mm/px · 14 of 51 slices shown]
[im 1/51]
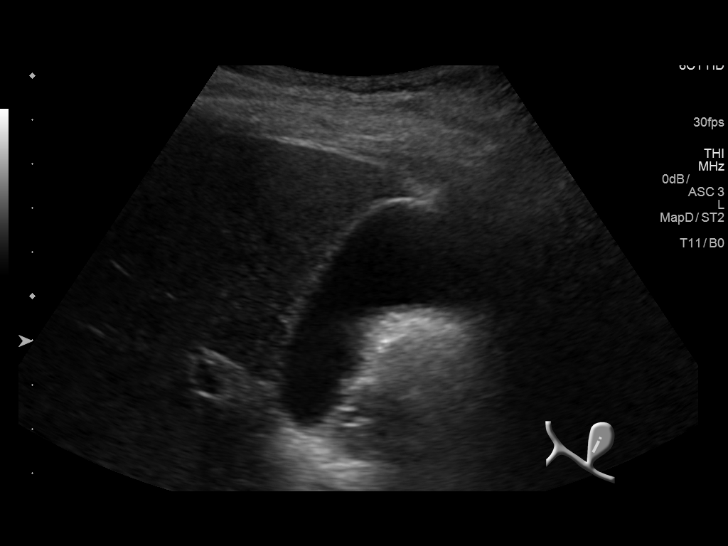
[im 5/51]
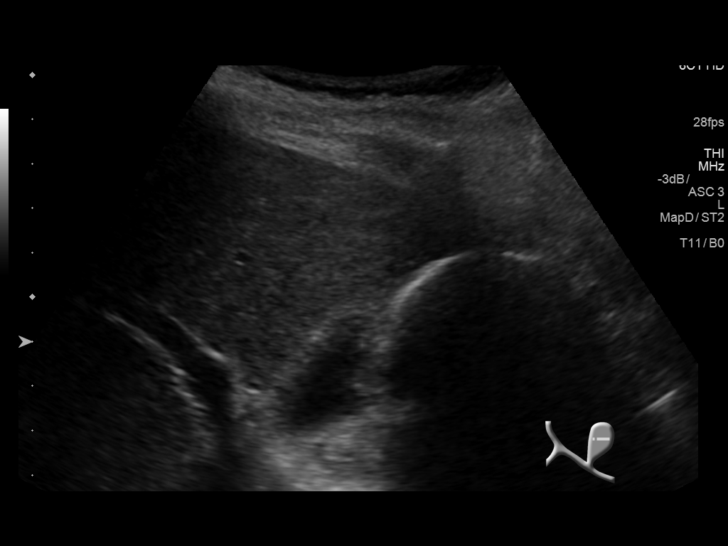
[im 9/51]
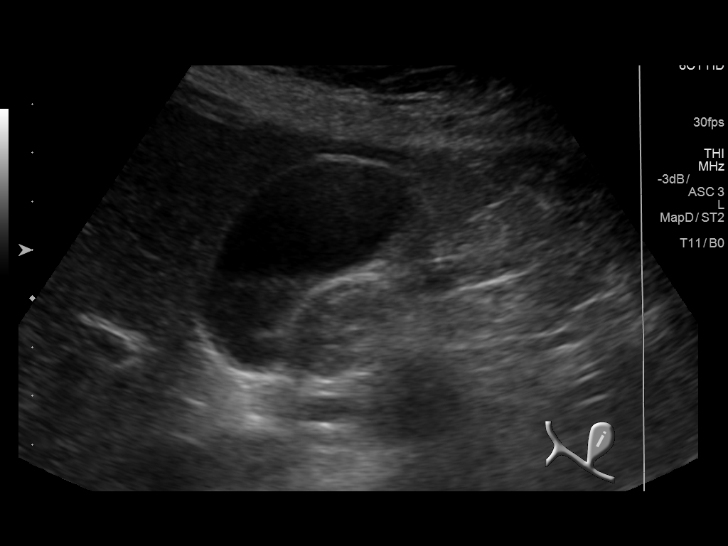
[im 13/51]
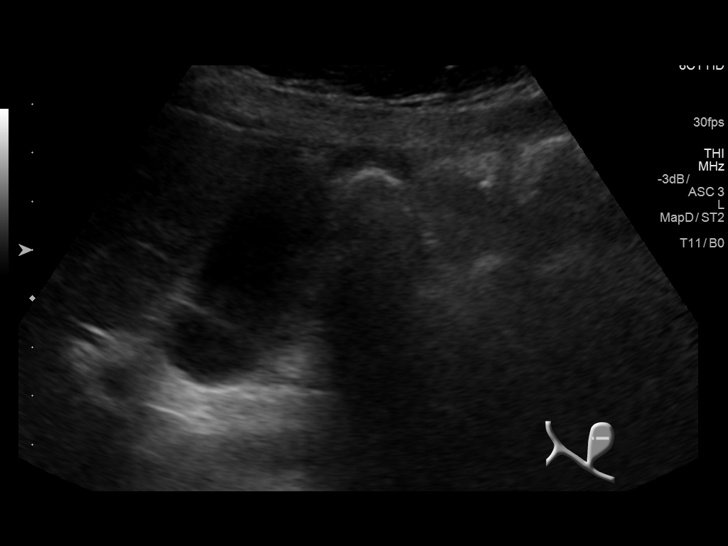
[im 17/51]
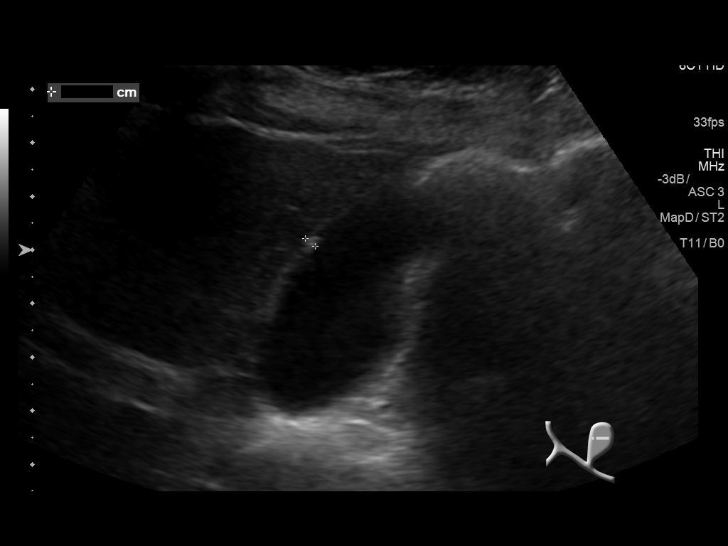
[im 19/51]
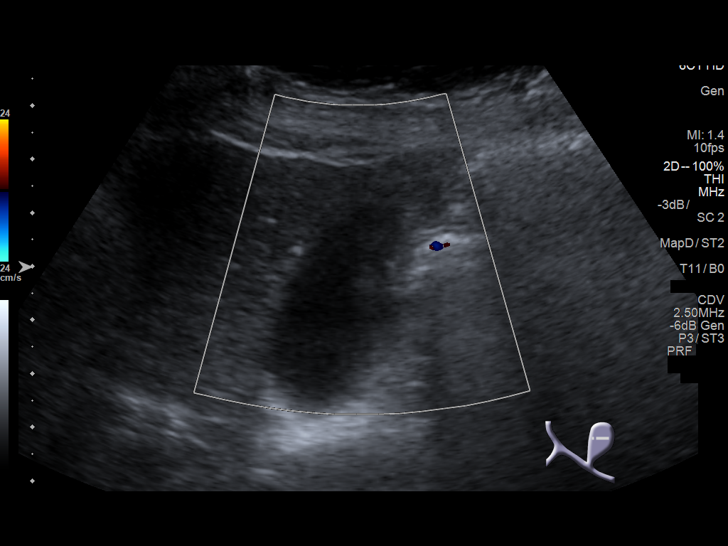
[im 23/51]
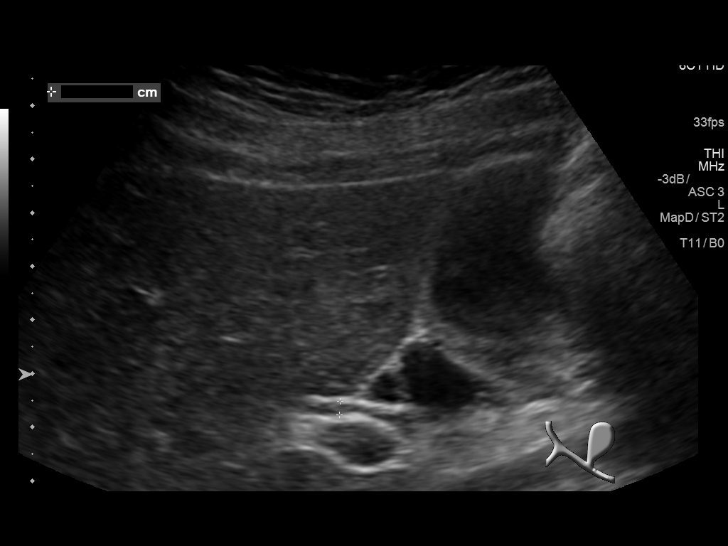
[im 28/51]
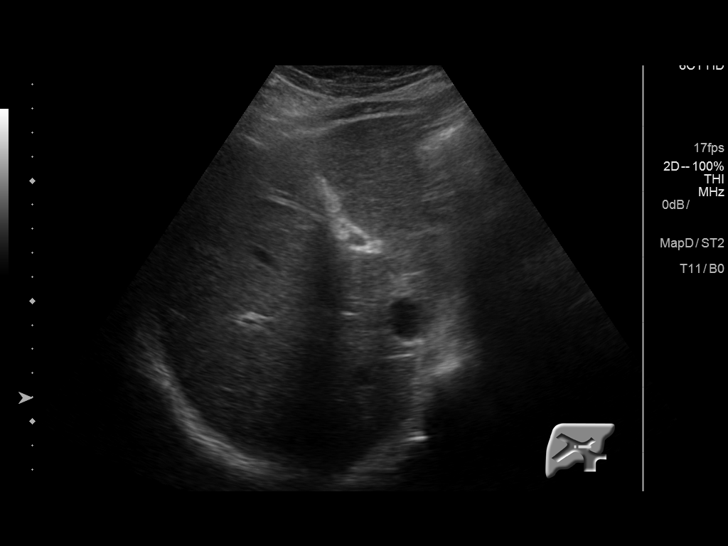
[im 32/51]
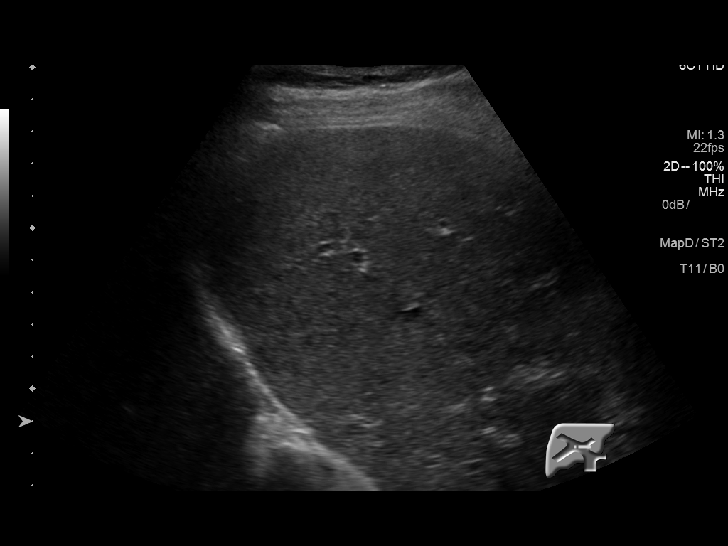
[im 34/51]
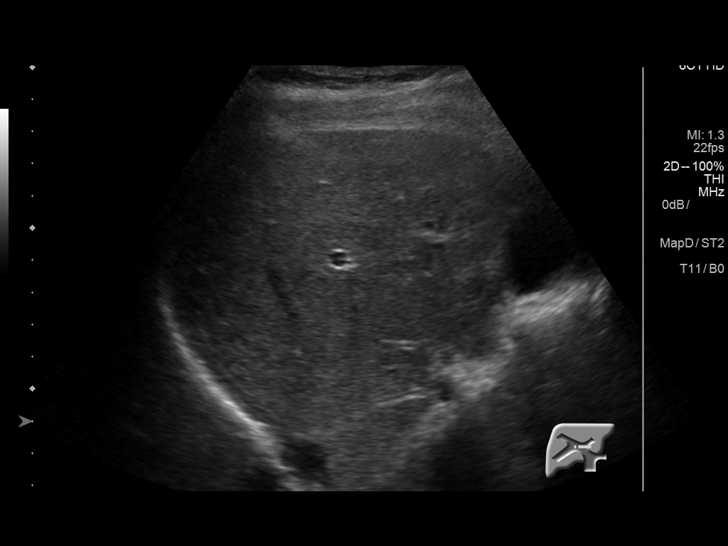
[im 38/51]
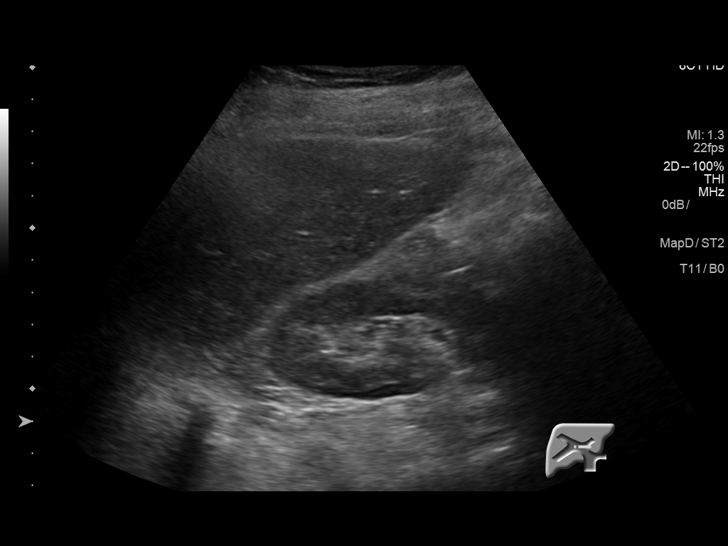
[im 42/51]
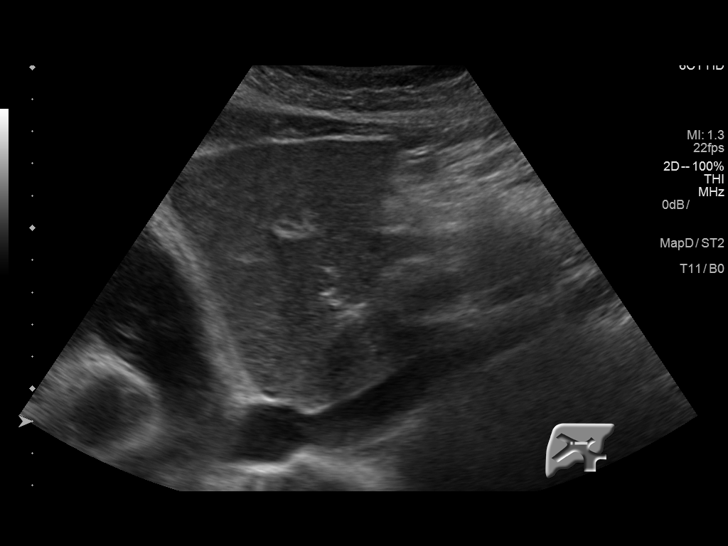
[im 46/51]
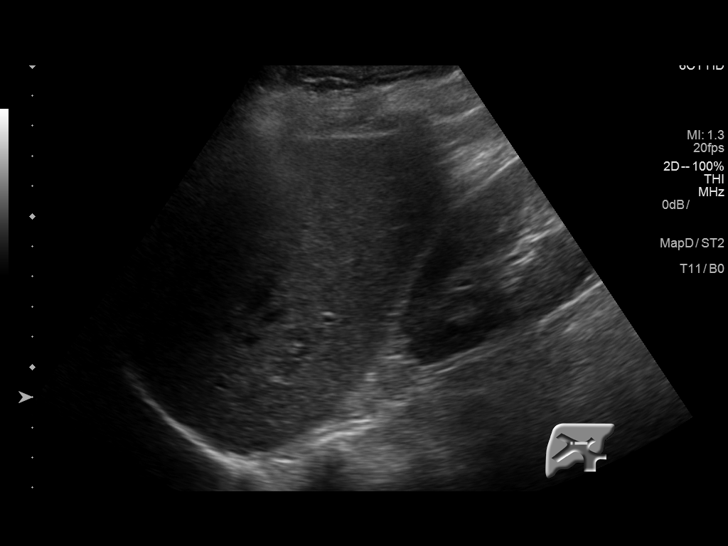
[im 51/51]
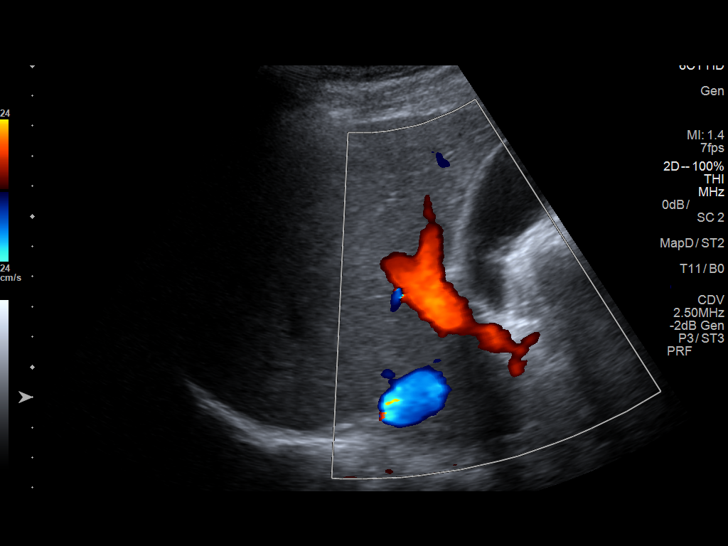

[14 of 25 positions shown; findings below may reference images not displayed]

FINDINGS: Gallbladder:

Echogenic sludge within the gallbladder. No shadowing gallstones.
Solitary calcification in the lateral wall of the gallbladder
fundus. No gallbladder wall thickening or pericholecystic fluid.
Negative sonographic Murphy sign according to the ultrasound
technologist.

Common bile duct:

Diameter: Approximately 3 mm.

Liver:

Normal size and echotexture without focal parenchymal abnormality.
Patent portal vein with hepatopetal flow.
IMPRESSION: 1. Gallbladder sludge. No sonographic evidence of cholelithiasis or
acute cholecystitis. Solitary calcification in the lateral wall of
the gallbladder fundus.
2. Otherwise normal examination.

## 2017-10-15 DIAGNOSIS — J301 Allergic rhinitis due to pollen: Secondary | ICD-10-CM | POA: Diagnosis not present

## 2017-10-15 DIAGNOSIS — H903 Sensorineural hearing loss, bilateral: Secondary | ICD-10-CM | POA: Diagnosis not present

## 2017-10-15 DIAGNOSIS — H8103 Meniere's disease, bilateral: Secondary | ICD-10-CM | POA: Diagnosis not present

## 2017-10-15 DIAGNOSIS — H9313 Tinnitus, bilateral: Secondary | ICD-10-CM | POA: Diagnosis not present

## 2017-10-29 DIAGNOSIS — J322 Chronic ethmoidal sinusitis: Secondary | ICD-10-CM | POA: Diagnosis not present

## 2017-10-29 DIAGNOSIS — J320 Chronic maxillary sinusitis: Secondary | ICD-10-CM | POA: Diagnosis not present

## 2017-10-29 DIAGNOSIS — H8103 Meniere's disease, bilateral: Secondary | ICD-10-CM | POA: Diagnosis not present

## 2017-10-29 DIAGNOSIS — J040 Acute laryngitis: Secondary | ICD-10-CM | POA: Diagnosis not present

## 2017-11-18 ENCOUNTER — Other Ambulatory Visit: Payer: Self-pay | Admitting: Nurse Practitioner

## 2017-11-18 DIAGNOSIS — F411 Generalized anxiety disorder: Secondary | ICD-10-CM

## 2017-11-28 ENCOUNTER — Other Ambulatory Visit: Payer: Self-pay | Admitting: *Deleted

## 2017-11-28 DIAGNOSIS — I10 Essential (primary) hypertension: Secondary | ICD-10-CM

## 2017-11-28 MED ORDER — LISINOPRIL 20 MG PO TABS: 20.0000 mg | ORAL_TABLET | Freq: Every day | ORAL | 0 refills | Status: DC

## 2017-12-04 ENCOUNTER — Encounter: Payer: Self-pay | Admitting: Physician Assistant

## 2017-12-04 ENCOUNTER — Ambulatory Visit: Payer: BLUE CROSS/BLUE SHIELD | Admitting: Physician Assistant

## 2017-12-04 ENCOUNTER — Ambulatory Visit (INDEPENDENT_AMBULATORY_CARE_PROVIDER_SITE_OTHER): Payer: BLUE CROSS/BLUE SHIELD

## 2017-12-04 VITALS — BP 127/79 | HR 79 | Temp 97.6°F | Ht 65.0 in | Wt 147.0 lb

## 2017-12-04 DIAGNOSIS — S299XXA Unspecified injury of thorax, initial encounter: Secondary | ICD-10-CM | POA: Diagnosis not present

## 2017-12-04 DIAGNOSIS — R0789 Other chest pain: Secondary | ICD-10-CM | POA: Diagnosis not present

## 2017-12-04 DIAGNOSIS — R0781 Pleurodynia: Secondary | ICD-10-CM | POA: Diagnosis not present

## 2017-12-07 NOTE — Progress Notes (Signed)
BP 127/79   Pulse 79   Temp 97.6 F (36.4 C) (Oral)   Ht 5\' 5"  (1.651 m)   Wt 147 lb (66.7 kg)   BMI 24.46 kg/m    Subjective:    Patient ID: Janet Randolph, female    DOB: 15-Aug-1961, 57 y.o.   MRN: [id number]  HPI: Janet Randolph is a 57 y.o. female presenting on 12/04/2017 for Chest Pain (right rib pain)  Approximately 5 days before the visit the patient hit her chest wall.  She began to feel better but in the past 2 days she has had much more right lower rib pain.  It hurts when she takes in deep breaths it hurts to the touch.  She denies any shortness of breath.  She denies any chest wall deformity.  Past Medical History:  Diagnosis Date  . Depression   . Eczema   . Hyperlipidemia   . Hypertension   . Vitamin D deficiency    Relevant past medical, surgical, family and social history reviewed and updated as indicated. Interim medical history since our last visit reviewed. Allergies and medications reviewed and updated. DATA REVIEWED: CHART IN EPIC  Family History reviewed for pertinent findings.  Review of Systems  Constitutional: Negative.  Negative for activity change, fatigue and fever.  HENT: Negative.   Eyes: Negative.   Respiratory: Negative.  Negative for cough.   Cardiovascular: Negative.  Negative for chest pain.  Gastrointestinal: Negative.  Negative for abdominal pain.  Endocrine: Negative.   Genitourinary: Negative.  Negative for dysuria.  Musculoskeletal: Positive for arthralgias and myalgias.  Skin: Negative.   Neurological: Negative.     Allergies as of 12/04/2017      Reactions   Latex    Penicillins       Medication List        Accurate as of 12/04/17 11:59 PM. Always use your most recent med list.          ALPRAZolam 0.5 MG tablet Commonly known as:  XANAX TAKE 1 TABLET BY MOUTH TWICE A DAY AS NEEDED   desonide 0.05 % cream Commonly known as:  DESOWEN Apply topically 2 (two) times daily.   esomeprazole 40 MG  capsule Commonly known as:  NEXIUM Take 1 capsule (40 mg total) by mouth daily. Take 30 min before meal   lisinopril 20 MG tablet Commonly known as:  PRINIVIL,ZESTRIL Take 1 tablet (20 mg total) by mouth daily.   rosuvastatin 10 MG tablet Commonly known as:  CRESTOR TAKE 1 TABLET (10 MG TOTAL) BY MOUTH DAILY. STOP LIPITOR   venlafaxine XR 150 MG 24 hr capsule Commonly known as:  EFFEXOR-XR TAKE 1 CAPSULE (150 MG TOTAL) BY MOUTH DAILY WITH BREAKFAST. MUST BE SEEN          Objective:    BP 127/79   Pulse 79   Temp 97.6 F (36.4 C) (Oral)   Ht 5\' 5"  (1.651 m)   Wt 147 lb (66.7 kg)   BMI 24.46 kg/m   Allergies  Allergen Reactions  . Latex   . Penicillins     Wt Readings from Last 3 Encounters:  12/04/17 147 lb (66.7 kg)  06/23/17 151 lb (68.5 kg)  05/26/17 153 lb 3.2 oz (69.5 kg)    Physical Exam  Constitutional: She is oriented to person, place, and time. She appears well-developed and well-nourished.  HENT:  Head: Normocephalic and atraumatic.  Eyes: Pupils are equal, round, and reactive to light. Conjunctivae and EOM  are normal.  Cardiovascular: Normal rate, regular rhythm, normal heart sounds and intact distal pulses.  Pulmonary/Chest: Effort normal and breath sounds normal. She exhibits tenderness and bony tenderness. She exhibits no mass, no crepitus, no edema, no deformity and no swelling.    Abdominal: Soft. Bowel sounds are normal.  Neurological: She is alert and oriented to person, place, and time. She has normal reflexes.  Skin: Skin is warm and dry. No rash noted.  Psychiatric: She has a normal mood and affect. Her behavior is normal. Judgment and thought content normal.        Assessment & Plan:   1. Rib pain on right side - DG Ribs Unilateral W/Chest Right; Future 2 small fracture, recheck 2 weeks  2. Chest wall pain   Continue all other maintenance medications as listed above.  Follow up plan: No follow-ups on file. Keep follow up with  PCP Chest wall protection discussed  Educational handout given for survey  Remus LofflerAngel S. Akeema Broder PA-C [facility] [address]  [place], [place] [address] [phone]   12/07/2017, 9:49 PM

## 2018-02-24 ENCOUNTER — Other Ambulatory Visit: Payer: Self-pay | Admitting: Nurse Practitioner

## 2018-02-24 DIAGNOSIS — I10 Essential (primary) hypertension: Secondary | ICD-10-CM

## 2018-03-10 ENCOUNTER — Ambulatory Visit: Payer: BLUE CROSS/BLUE SHIELD | Admitting: Nurse Practitioner

## 2018-03-10 ENCOUNTER — Encounter: Payer: Self-pay | Admitting: Nurse Practitioner

## 2018-03-10 VITALS — BP 132/79 | HR 71 | Temp 97.5°F | Ht 65.0 in | Wt 148.0 lb

## 2018-03-10 DIAGNOSIS — F411 Generalized anxiety disorder: Secondary | ICD-10-CM | POA: Diagnosis not present

## 2018-03-10 DIAGNOSIS — J0100 Acute maxillary sinusitis, unspecified: Secondary | ICD-10-CM | POA: Diagnosis not present

## 2018-03-10 MED ORDER — DOXYCYCLINE HYCLATE 100 MG PO TABS: 100.0000 mg | ORAL_TABLET | Freq: Two times a day (BID) | ORAL | 0 refills | Status: DC

## 2018-03-10 NOTE — Progress Notes (Signed)
   Subjective:    Patient ID: Janet Randolph, female    DOB: 1960-10-12, 57 y.o.   MRN: [id number]   Chief Complaint: Sinus Problem and difficulty hearing   HPI Patient comes in today c/o sinus pain. Her teeth have been hurting and ears hurting. This always occurs when she has a sinus infection. These symptoms started about 4 days ago.   Review of Systems  Constitutional: Negative.   HENT: Positive for congestion, ear pain, sinus pressure and sinus pain. Negative for sore throat and trouble swallowing.   Respiratory: Positive for cough (occasional).   Gastrointestinal: Negative.   Genitourinary: Negative.   Musculoskeletal: Negative.   Neurological: Positive for headaches.  Psychiatric/Behavioral: Negative.   All other systems reviewed and are negative.      Objective:   Physical Exam  Constitutional: She is oriented to person, place, and time. She appears well-developed and well-nourished.  HENT:  Right Ear: Hearing, tympanic membrane, external ear and ear canal normal.  Left Ear: Hearing, tympanic membrane, external ear and ear canal normal.  Nose: Mucosal edema and rhinorrhea present. Right sinus exhibits maxillary sinus tenderness. Left sinus exhibits maxillary sinus tenderness.  Mouth/Throat: Uvula is midline, oropharynx is clear and moist and mucous membranes are normal.  Cardiovascular: Normal rate.  Pulmonary/Chest: Effort normal.  Neurological: She is alert and oriented to person, place, and time.  Skin: Skin is warm.  Psychiatric: She has a normal mood and affect. Her behavior is normal. Judgment and thought content normal.   BP 132/79   Pulse 71   Temp (!) 97.5 F (36.4 C) (Oral)   Ht 5\' 5"  (1.651 m)   Wt 148 lb (67.1 kg)   BMI 24.63 kg/m         Assessment & Plan:  Janet Fragminonya H Nevitt in today with chief complaint of Sinus Problem and difficulty hearing   1. Acute maxillary sinusitis, recurrence not specified 1. Take meds as prescribed 2. Use a  cool mist humidifier especially during the winter months and when heat has been humid. 3. Use saline nose sprays frequently 4. Saline irrigations of the nose can be very helpful if done frequently.  * 4X daily for 1 week*  * Use of a nettie pot can be helpful with this. Follow directions with this* 5. Drink plenty of fluids 6. Keep thermostat turn down low 7.For any cough or congestion  Use plain Mucinex- regular strength or max strength is fine   * Children- consult with Pharmacist for dosing 8. For fever or aces or pains- take tylenol or ibuprofen appropriate for age and weight.  * for fevers greater than 101 orally you may alternate ibuprofen and tylenol every  3 hours.   Meds ordered this encounter  Medications  . doxycycline (VIBRA-TABS) 100 MG tablet    Sig: Take 1 tablet (100 mg total) by mouth 2 (two) times daily. 1 po bid    Dispense:  20 tablet    Refill:  0    Order Specific Question:   Supervising Provider    Answer:   Johna SheriffVINCENT, CAROL L [[id number]]   Mary-Margaret Daphine DeutscherMartin, FNP

## 2018-03-10 NOTE — Patient Instructions (Signed)

## 2018-03-11 MED ORDER — ALPRAZOLAM 0.5 MG PO TABS: ORAL_TABLET | ORAL | 1 refills | Status: DC

## 2018-03-11 NOTE — Addendum Note (Signed)
Addended by: Bennie PieriniMARTIN, MARY-MARGARET on: 03/11/2018 10:08 AM   Modules accepted: Orders

## 2018-03-25 ENCOUNTER — Other Ambulatory Visit: Payer: Self-pay | Admitting: Nurse Practitioner

## 2018-03-25 DIAGNOSIS — K219 Gastro-esophageal reflux disease without esophagitis: Secondary | ICD-10-CM

## 2018-05-26 DIAGNOSIS — R1012 Left upper quadrant pain: Secondary | ICD-10-CM | POA: Diagnosis not present

## 2018-05-28 DIAGNOSIS — K319 Disease of stomach and duodenum, unspecified: Secondary | ICD-10-CM | POA: Diagnosis not present

## 2018-05-28 DIAGNOSIS — R1013 Epigastric pain: Secondary | ICD-10-CM | POA: Diagnosis not present

## 2018-05-28 DIAGNOSIS — K210 Gastro-esophageal reflux disease with esophagitis: Secondary | ICD-10-CM | POA: Diagnosis not present

## 2018-05-28 DIAGNOSIS — T5494XA Toxic effect of unspecified corrosive substance, undetermined, initial encounter: Secondary | ICD-10-CM | POA: Diagnosis not present

## 2018-05-28 DIAGNOSIS — T287XXA Corrosion of other parts of alimentary tract, initial encounter: Secondary | ICD-10-CM | POA: Diagnosis not present

## 2018-06-02 ENCOUNTER — Other Ambulatory Visit: Payer: Self-pay | Admitting: Nurse Practitioner

## 2018-06-02 DIAGNOSIS — K319 Disease of stomach and duodenum, unspecified: Secondary | ICD-10-CM | POA: Diagnosis not present

## 2018-06-02 DIAGNOSIS — I10 Essential (primary) hypertension: Secondary | ICD-10-CM

## 2018-06-03 ENCOUNTER — Other Ambulatory Visit: Payer: Self-pay | Admitting: Nurse Practitioner

## 2018-06-03 DIAGNOSIS — F3342 Major depressive disorder, recurrent, in full remission: Secondary | ICD-10-CM

## 2018-06-03 DIAGNOSIS — E782 Mixed hyperlipidemia: Secondary | ICD-10-CM

## 2018-06-04 NOTE — Telephone Encounter (Signed)
Last seen 03/10/18  Last lipid 06/23/17

## 2018-06-05 ENCOUNTER — Other Ambulatory Visit: Payer: Self-pay | Admitting: Nurse Practitioner

## 2018-06-05 DIAGNOSIS — F3342 Major depressive disorder, recurrent, in full remission: Secondary | ICD-10-CM

## 2018-06-06 ENCOUNTER — Other Ambulatory Visit: Payer: Self-pay | Admitting: Nurse Practitioner

## 2018-06-06 DIAGNOSIS — F3342 Major depressive disorder, recurrent, in full remission: Secondary | ICD-10-CM

## 2018-06-24 ENCOUNTER — Other Ambulatory Visit: Payer: Self-pay | Admitting: Nurse Practitioner

## 2018-06-24 DIAGNOSIS — F411 Generalized anxiety disorder: Secondary | ICD-10-CM

## 2018-07-20 ENCOUNTER — Other Ambulatory Visit: Payer: Self-pay | Admitting: Nurse Practitioner

## 2018-07-20 DIAGNOSIS — I10 Essential (primary) hypertension: Secondary | ICD-10-CM

## 2018-07-21 NOTE — Telephone Encounter (Signed)
MMM. NTBS 30 day given 06/02/18

## 2018-07-21 NOTE — Telephone Encounter (Signed)
Detailed message left that patient needs to be seen 

## 2018-08-17 ENCOUNTER — Telehealth: Payer: Self-pay | Admitting: *Deleted

## 2018-08-17 NOTE — Telephone Encounter (Signed)
[place] [facility] 0.5[facility]  is on backorder Per pharmacist 0.25mg  is also on back order For 1 time only xanax 1mg  1/2 BID prn Ok per [employer] CorporationMichelle Rakes

## 2018-08-23 ENCOUNTER — Other Ambulatory Visit: Payer: Self-pay | Admitting: Nurse Practitioner

## 2018-08-23 DIAGNOSIS — I10 Essential (primary) hypertension: Secondary | ICD-10-CM

## 2018-08-24 ENCOUNTER — Telehealth: Payer: Self-pay | Admitting: Nurse Practitioner

## 2018-08-24 NOTE — Telephone Encounter (Signed)
Pt aware she ntbs for further refills and has appt 12/27 with MMM.

## 2018-08-27 ENCOUNTER — Ambulatory Visit: Payer: BLUE CROSS/BLUE SHIELD | Admitting: Nurse Practitioner

## 2018-08-27 ENCOUNTER — Encounter: Payer: Self-pay | Admitting: Nurse Practitioner

## 2018-08-27 VITALS — BP 136/82 | HR 85 | Temp 96.8°F | Ht 65.0 in | Wt 144.0 lb

## 2018-08-27 DIAGNOSIS — F411 Generalized anxiety disorder: Secondary | ICD-10-CM

## 2018-08-27 DIAGNOSIS — E782 Mixed hyperlipidemia: Secondary | ICD-10-CM

## 2018-08-27 DIAGNOSIS — I10 Essential (primary) hypertension: Secondary | ICD-10-CM

## 2018-08-27 DIAGNOSIS — K219 Gastro-esophageal reflux disease without esophagitis: Secondary | ICD-10-CM

## 2018-08-27 DIAGNOSIS — F3342 Major depressive disorder, recurrent, in full remission: Secondary | ICD-10-CM

## 2018-08-27 MED ORDER — ROSUVASTATIN CALCIUM 10 MG PO TABS: 10.0000 mg | ORAL_TABLET | Freq: Every day | ORAL | 1 refills | Status: DC

## 2018-08-27 MED ORDER — ALPRAZOLAM 0.5 MG PO TABS: 0.5000 mg | ORAL_TABLET | Freq: Two times a day (BID) | ORAL | 5 refills | Status: DC | PRN

## 2018-08-27 MED ORDER — VENLAFAXINE HCL ER 150 MG PO CP24: 150.0000 mg | ORAL_CAPSULE | Freq: Every day | ORAL | 1 refills | Status: DC

## 2018-08-27 MED ORDER — LISINOPRIL 20 MG PO TABS: 20.0000 mg | ORAL_TABLET | Freq: Every day | ORAL | 1 refills | Status: DC

## 2018-08-27 MED ORDER — ESOMEPRAZOLE MAGNESIUM 40 MG PO CPDR: 40.0000 mg | DELAYED_RELEASE_CAPSULE | Freq: Every day | ORAL | 1 refills | Status: DC

## 2018-08-27 NOTE — Patient Instructions (Signed)
Health Maintenance for Postmenopausal Women Menopause is a normal process in which your reproductive ability comes to an end. This process happens gradually over a span of months to years, usually between the ages of 62 and 89. Menopause is complete when you have missed 12 consecutive menstrual periods. It is important to talk with your health care provider about some of the most common conditions that affect postmenopausal women, such as heart disease, cancer, and bone loss (osteoporosis). Adopting a healthy lifestyle and getting preventive care can help to promote your health and wellness. Those actions can also lower your chances of developing some of these common conditions. What should I know about menopause? During menopause, you may experience a number of symptoms, such as:  Moderate-to-severe hot flashes.  Night sweats.  Decrease in sex drive.  Mood swings.  Headaches.  Tiredness.  Irritability.  Memory problems.  Insomnia. Choosing to treat or not to treat menopausal changes is an individual decision that you make with your health care provider. What should I know about hormone replacement therapy and supplements? Hormone therapy products are effective for treating symptoms that are associated with menopause, such as hot flashes and night sweats. Hormone replacement carries certain risks, especially as you become older. If you are thinking about using estrogen or estrogen with progestin treatments, discuss the benefits and risks with your health care provider. What should I know about heart disease and stroke? Heart disease, heart attack, and stroke become more likely as you age. This may be due, in part, to the hormonal changes that your body experiences during menopause. These can affect how your body processes dietary fats, triglycerides, and cholesterol. Heart attack and stroke are both medical emergencies. There are many things that you can do to help prevent heart disease  and stroke:  Have your blood pressure checked at least every 1-2 years. High blood pressure causes heart disease and increases the risk of stroke.  If you are 79-72 years old, ask your health care provider if you should take aspirin to prevent a heart attack or a stroke.  Do not use any tobacco products, including cigarettes, chewing tobacco, or electronic cigarettes. If you need help quitting, ask your health care provider.  It is important to eat a healthy diet and maintain a healthy weight. ? Be sure to include plenty of vegetables, fruits, low-fat dairy products, and lean protein. ? Avoid eating foods that are high in solid fats, added sugars, or salt (sodium).  Get regular exercise. This is one of the most important things that you can do for your health. ? Try to exercise for at least 150 minutes each week. The type of exercise that you do should increase your heart rate and make you sweat. This is known as moderate-intensity exercise. ? Try to do strengthening exercises at least twice each week. Do these in addition to the moderate-intensity exercise.  Know your numbers.Ask your health care provider to check your cholesterol and your blood glucose. Continue to have your blood tested as directed by your health care provider.  What should I know about cancer screening? There are several types of cancer. Take the following steps to reduce your risk and to catch any cancer development as early as possible. Breast Cancer  Practice breast self-awareness. ? This means understanding how your breasts normally appear and feel. ? It also means doing regular breast self-exams. Let your health care provider know about any changes, no matter how small.  If you are 40 or  older, have a clinician do a breast exam (clinical breast exam or CBE) every year. Depending on your age, family history, and medical history, it may be recommended that you also have a yearly breast X-ray (mammogram).  If you  have a family history of breast cancer, talk with your health care provider about genetic screening.  If you are at high risk for breast cancer, talk with your health care provider about having an MRI and a mammogram every year.  Breast cancer (BRCA) gene test is recommended for women who have family members with BRCA-related cancers. Results of the assessment will determine the need for genetic counseling and BRCA1 and for BRCA2 testing. BRCA-related cancers include these types: ? Breast. This occurs in males or females. ? Ovarian. ? Tubal. This may also be called fallopian tube cancer. ? Cancer of the abdominal or pelvic lining (peritoneal cancer). ? Prostate. ? Pancreatic. Cervical, Uterine, and Ovarian Cancer Your health care provider may recommend that you be screened regularly for cancer of the pelvic organs. These include your ovaries, uterus, and vagina. This screening involves a pelvic exam, which includes checking for microscopic changes to the surface of your cervix (Pap test).  For women ages 21-65, health care providers may recommend a pelvic exam and a Pap test every three years. For women ages 39-65, they may recommend the Pap test and pelvic exam, combined with testing for human papilloma virus (HPV), every five years. Some types of HPV increase your risk of cervical cancer. Testing for HPV may also be done on women of any age who have unclear Pap test results.  Other health care providers may not recommend any screening for nonpregnant women who are considered low risk for pelvic cancer and have no symptoms. Ask your health care provider if a screening pelvic exam is right for you.  If you have had past treatment for cervical cancer or a condition that could lead to cancer, you need Pap tests and screening for cancer for at least 20 years after your treatment. If Pap tests have been discontinued for you, your risk factors (such as having a new sexual partner) need to be reassessed  to determine if you should start having screenings again. Some women have medical problems that increase the chance of getting cervical cancer. In these cases, your health care provider may recommend that you have screening and Pap tests more often.  If you have a family history of uterine cancer or ovarian cancer, talk with your health care provider about genetic screening.  If you have vaginal bleeding after reaching menopause, tell your health care provider.  There are currently no reliable tests available to screen for ovarian cancer. Lung Cancer Lung cancer screening is recommended for adults 57-50 years old who are at high risk for lung cancer because of a history of smoking. A yearly low-dose CT scan of the lungs is recommended if you:  Currently smoke.  Have a history of at least 30 pack-years of smoking and you currently smoke or have quit within the past 15 years. A pack-year is smoking an average of one pack of cigarettes per day for one year. Yearly screening should:  Continue until it has been 15 years since you quit.  Stop if you develop a health problem that would prevent you from having lung cancer treatment. Colorectal Cancer  This type of cancer can be detected and can often be prevented.  Routine colorectal cancer screening usually begins at age 12 and continues through  age 75.  If you have risk factors for colon cancer, your health care provider may recommend that you be screened at an earlier age.  If you have a family history of colorectal cancer, talk with your health care provider about genetic screening.  Your health care provider may also recommend using home test kits to check for hidden blood in your stool.  A small camera at the end of a tube can be used to examine your colon directly (sigmoidoscopy or colonoscopy). This is done to check for the earliest forms of colorectal cancer.  Direct examination of the colon should be repeated every 5-10 years until  age 75. However, if early forms of precancerous polyps or small growths are found or if you have a family history or genetic risk for colorectal cancer, you may need to be screened more often. Skin Cancer  Check your skin from head to toe regularly.  Monitor any moles. Be sure to tell your health care provider: ? About any new moles or changes in moles, especially if there is a change in a mole's shape or color. ? If you have a mole that is larger than the size of a pencil eraser.  If any of your family members has a history of skin cancer, especially at a young age, talk with your health care provider about genetic screening.  Always use sunscreen. Apply sunscreen liberally and repeatedly throughout the day.  Whenever you are outside, protect yourself by wearing long sleeves, pants, a wide-brimmed hat, and sunglasses. What should I know about osteoporosis? Osteoporosis is a condition in which bone destruction happens more quickly than new bone creation. After menopause, you may be at an increased risk for osteoporosis. To help prevent osteoporosis or the bone fractures that can happen because of osteoporosis, the following is recommended:  If you are 19-50 years old, get at least 1,000 mg of calcium and at least 600 mg of vitamin D per day.  If you are older than age 50 but younger than age 70, get at least 1,200 mg of calcium and at least 600 mg of vitamin D per day.  If you are older than age 70, get at least 1,200 mg of calcium and at least 800 mg of vitamin D per day. Smoking and excessive alcohol intake increase the risk of osteoporosis. Eat foods that are rich in calcium and vitamin D, and do weight-bearing exercises several times each week as directed by your health care provider. What should I know about how menopause affects my mental health? Depression may occur at any age, but it is more common as you become older. Common symptoms of depression include:  Low or sad  mood.  Changes in sleep patterns.  Changes in appetite or eating patterns.  Feeling an overall lack of motivation or enjoyment of activities that you previously enjoyed.  Frequent crying spells. Talk with your health care provider if you think that you are experiencing depression. What should I know about immunizations? It is important that you get and maintain your immunizations. These include:  Tetanus, diphtheria, and pertussis (Tdap) booster vaccine.  Influenza every year before the flu season begins.  Pneumonia vaccine.  Shingles vaccine. Your health care provider may also recommend other immunizations. This information is not intended to replace advice given to you by your health care provider. Make sure you discuss any questions you have with your health care provider. Document Released: 10/18/2005 Document Revised: 03/15/2016 Document Reviewed: 05/30/2015 Elsevier Interactive Patient Education    2019 [place].

## 2018-08-27 NOTE — Progress Notes (Signed)
Subjective:    Patient ID: Janet Randolph, female    DOB: [birth year], 57 y.o.   MRN: [id number]   Chief Complaint: medical management of chronic issues  HPI:  1. Essential hypertension  no c/o chest pain, sob or headache. Does not check blood pressures at home. BP Readings from Last 3 Encounters:  08/27/18 (!) 148/82  03/10/18 132/79  12/04/17 127/79     2. Gastroesophageal reflux disease, esophagitis presence not specified  Is on nexium daily and works well to keep symptoms under control.  3. Mixed hyperlipidemia  Does not watch diet and does no exercise.  4. GAD (generalized anxiety disorder)  Is on xanax bid- works well.  5. Recurrent major depressive disorder, in full remission ([place])  Is on effexor and has been for several years. Is doing well. No side effects. Depression screen [facility] 2/9 08/27/2018 03/10/2018 06/23/2017  Decreased Interest 0 0 0  Down, Depressed, Hopeless 0 0 0  PHQ - 2 Score 0 0 0  Altered sleeping - - -  Tired, decreased energy - - -  Change in appetite - - -  Feeling bad or failure about yourself  - - -  Trouble concentrating - - -  Moving slowly or fidgety/restless - - -  Suicidal thoughts - - -  PHQ-9 Score - - -       Outpatient Encounter Medications as of 08/27/2018  Medication Sig  . ALPRAZolam (XANAX) 0.5 MG tablet TAKE 1 TABLET BY MOUTH TWICE A DAY AS NEEDED  . desonide (DESOWEN) 0.05 % cream Apply topically 2 (two) times daily.  Marland Kitchen esomeprazole (NEXIUM) 40 MG capsule TAKE 1 CAPSULE (40 MG TOTAL) BY MOUTH DAILY. TAKE 30 MIN BEFORE MEAL  . lisinopril (PRINIVIL,ZESTRIL) 20 MG tablet Take 1 tablet (20 mg total) by mouth daily. (Needs to be seen before next refill)  . rosuvastatin (CRESTOR) 10 MG tablet TAKE 1 TABLET (10 MG TOTAL) BY MOUTH DAILY. STOP LIPITOR  . venlafaxine XR (EFFEXOR-XR) 150 MG 24 hr capsule TAKE 1 CAPSULE (150 MG TOTAL) BY MOUTH DAILY WITH BREAKFAST. MUST BE SEEN     New complaints: None today  Social  history: Lives alone- still working at [employer] in [employer].  Review of Systems  Constitutional: Negative for activity change and appetite change.  HENT: Negative.   Eyes: Negative for pain.  Respiratory: Negative for shortness of breath.   Cardiovascular: Negative for chest pain, palpitations and leg swelling.  Gastrointestinal: Negative for abdominal pain.  Endocrine: Negative for polydipsia.  Genitourinary: Negative.   Skin: Negative for rash.  Neurological: Negative for dizziness, weakness and headaches.  Hematological: Does not bruise/bleed easily.  Psychiatric/Behavioral: Negative.   All other systems reviewed and are negative.      Objective:   Physical Exam Vitals signs and nursing note reviewed.  Constitutional:      General: She is not in acute distress.    Appearance: Normal appearance. She is well-developed.  HENT:     Head: Normocephalic.     Nose: Nose normal.  Eyes:     Pupils: Pupils are equal, round, and reactive to light.  Neck:     Musculoskeletal: Normal range of motion and neck supple.     Vascular: No carotid bruit or JVD.  Cardiovascular:     Rate and Rhythm: Normal rate and regular rhythm.     Heart sounds: Normal heart sounds.  Pulmonary:     Effort: Pulmonary effort is normal. No respiratory distress.  Breath sounds: Normal breath sounds. No wheezing or rales.  Chest:     Chest wall: No tenderness.  Abdominal:     General: Bowel sounds are normal. There is no distension or abdominal bruit.     Palpations: Abdomen is soft. There is no hepatomegaly, splenomegaly, mass or pulsatile mass.     Tenderness: There is no abdominal tenderness.  Musculoskeletal: Normal range of motion.  Lymphadenopathy:     Cervical: No cervical adenopathy.  Skin:    General: Skin is warm and dry.  Neurological:     Mental Status: She is alert and oriented to person, place, and time.     Deep Tendon Reflexes: Reflexes are normal and symmetric.  Psychiatric:         Behavior: Behavior normal.        Thought Content: Thought content normal.        Judgment: Judgment normal.    BP 136/82 (BP Location: Left Arm, Cuff Size: Normal)   Pulse 85   Temp (!) 96.8 F (36 C) (Oral)   Ht 5' 5"  (1.651 m)   Wt 144 lb (65.3 kg)   BMI 23.96 kg/m        Assessment & Plan:  Janet Randolph comes in today with chief complaint of No chief complaint on file.   Diagnosis and orders addressed:  1. Essential hypertension Low sodium diet - lisinopril (PRINIVIL,ZESTRIL) 20 MG tablet; Take 1 tablet (20 mg total) by mouth daily. (Needs to be seen before next refill)  Dispense: 90 tablet; Refill: 1 - CMP14+EGFR  2. Gastroesophageal reflux disease, esophagitis presence not specified Avoid spicy foods Do not eat 2 hours prior to bedtime - esomeprazole (NEXIUM) 40 MG capsule; Take 1 capsule (40 mg total) by mouth daily. Take 30 min before meal  Dispense: 90 capsule; Refill: 1  3. Mixed hyperlipidemia Low fat diet - rosuvastatin (CRESTOR) 10 MG tablet; Take 1 tablet (10 mg total) by mouth daily.  Dispense: 90 tablet; Refill: 1 - Lipid panel  4. GAD (generalized anxiety disorder) Stress management - ALPRAZolam (XANAX) 0.5 MG tablet; Take 1 tablet (0.5 mg total) by mouth 2 (two) times daily as needed.  Dispense: 60 tablet; Refill: 5  5. Recurrent major depressive disorder, in full remission ([place]) Continue effexor as rx   Labs pending Health Maintenance reviewed Diet and exercise encouraged  Follow up plan: 6 months   [place], FNP

## 2018-08-28 LAB — CMP14+EGFR
ALT: 9 IU/L (ref 0–32)
AST: 13 IU/L (ref 0–40)
Albumin/Globulin Ratio: 1.9 (ref 1.2–2.2)
Albumin: 4.2 g/dL (ref 3.5–5.5)
Alkaline Phosphatase: 49 IU/L (ref 39–117)
BUN/Creatinine Ratio: 24 — ABNORMAL HIGH (ref 9–23)
BUN: 16 mg/dL (ref 6–24)
Bilirubin Total: <0.2 mg/dL (ref 0.0–1.2)
CO2: 23 mmol/L (ref 20–29)
Calcium: 8.8 mg/dL (ref 8.7–10.2)
Chloride: 105 mmol/L (ref 96–106)
Creatinine, Ser: 0.67 mg/dL (ref 0.57–1.00)
GFR calc Af Amer: 111 mL/min/{1.73_m2} (ref >59)
GFR calc non Af Amer: 97 mL/min/{1.73_m2} (ref >59)
Globulin, Total: 2.2 g/dL (ref 1.5–4.5)
Glucose: 92 mg/dL (ref 65–99)
Potassium: 3.9 mmol/L (ref 3.5–5.2)
Sodium: 142 mmol/L (ref 134–144)
Total Protein: 6.4 g/dL (ref 6.0–8.5)

## 2018-08-28 LAB — LIPID PANEL
Chol/HDL Ratio: 5.2 ratio — ABNORMAL HIGH (ref 0.0–4.4)
Cholesterol, Total: 270 mg/dL — ABNORMAL HIGH (ref 100–199)
HDL: 52 mg/dL (ref >39)
LDL Calculated: 190 mg/dL — ABNORMAL HIGH (ref 0–99)
Triglycerides: 142 mg/dL (ref 0–149)
VLDL Cholesterol Cal: 28 mg/dL (ref 5–40)

## 2018-09-04 ENCOUNTER — Ambulatory Visit: Payer: BLUE CROSS/BLUE SHIELD | Admitting: Nurse Practitioner

## 2018-10-09 ENCOUNTER — Encounter: Payer: Self-pay | Admitting: *Deleted

## 2018-11-02 ENCOUNTER — Telehealth: Payer: Self-pay

## 2018-11-02 NOTE — Telephone Encounter (Signed)
Please call me

## 2018-11-03 ENCOUNTER — Ambulatory Visit: Payer: BLUE CROSS/BLUE SHIELD | Admitting: Family Medicine

## 2018-11-03 NOTE — Telephone Encounter (Signed)
Patient states that she has broken wrist and has an appt with Ortho on Monday

## 2018-11-03 NOTE — Telephone Encounter (Signed)
Please call patient and see what she needs.

## 2018-11-06 DIAGNOSIS — S52572A Other intraarticular fracture of lower end of left radius, initial encounter for closed fracture: Secondary | ICD-10-CM | POA: Diagnosis not present

## 2018-11-11 DIAGNOSIS — S52571A Other intraarticular fracture of lower end of right radius, initial encounter for closed fracture: Secondary | ICD-10-CM | POA: Diagnosis not present

## 2018-11-11 DIAGNOSIS — S52572D Other intraarticular fracture of lower end of left radius, subsequent encounter for closed fracture with routine healing: Secondary | ICD-10-CM | POA: Diagnosis not present

## 2018-11-11 DIAGNOSIS — X58XXXA Exposure to other specified factors, initial encounter: Secondary | ICD-10-CM | POA: Diagnosis not present

## 2018-11-11 DIAGNOSIS — G8918 Other acute postprocedural pain: Secondary | ICD-10-CM | POA: Diagnosis not present

## 2018-11-11 DIAGNOSIS — Y999 Unspecified external cause status: Secondary | ICD-10-CM | POA: Diagnosis not present

## 2018-11-11 DIAGNOSIS — S52572A Other intraarticular fracture of lower end of left radius, initial encounter for closed fracture: Secondary | ICD-10-CM | POA: Diagnosis not present

## 2018-11-24 DIAGNOSIS — S52512D Displaced fracture of left radial styloid process, subsequent encounter for closed fracture with routine healing: Secondary | ICD-10-CM | POA: Diagnosis not present

## 2018-12-03 ENCOUNTER — Other Ambulatory Visit: Payer: Self-pay | Admitting: Nurse Practitioner

## 2018-12-03 DIAGNOSIS — I10 Essential (primary) hypertension: Secondary | ICD-10-CM

## 2018-12-08 DIAGNOSIS — S52512D Displaced fracture of left radial styloid process, subsequent encounter for closed fracture with routine healing: Secondary | ICD-10-CM | POA: Diagnosis not present

## 2018-12-08 DIAGNOSIS — M25632 Stiffness of left wrist, not elsewhere classified: Secondary | ICD-10-CM | POA: Diagnosis not present

## 2018-12-09 ENCOUNTER — Other Ambulatory Visit: Payer: Self-pay | Admitting: Nurse Practitioner

## 2018-12-09 DIAGNOSIS — I10 Essential (primary) hypertension: Secondary | ICD-10-CM

## 2018-12-11 ENCOUNTER — Other Ambulatory Visit: Payer: Self-pay | Admitting: *Deleted

## 2018-12-22 DIAGNOSIS — M25632 Stiffness of left wrist, not elsewhere classified: Secondary | ICD-10-CM | POA: Diagnosis not present

## 2018-12-29 DIAGNOSIS — M25632 Stiffness of left wrist, not elsewhere classified: Secondary | ICD-10-CM | POA: Diagnosis not present

## 2019-01-01 ENCOUNTER — Other Ambulatory Visit: Payer: Self-pay | Admitting: Nurse Practitioner

## 2019-01-01 DIAGNOSIS — I10 Essential (primary) hypertension: Secondary | ICD-10-CM

## 2019-01-04 ENCOUNTER — Other Ambulatory Visit: Payer: Self-pay | Admitting: Nurse Practitioner

## 2019-01-04 DIAGNOSIS — I10 Essential (primary) hypertension: Secondary | ICD-10-CM

## 2019-01-05 DIAGNOSIS — M25632 Stiffness of left wrist, not elsewhere classified: Secondary | ICD-10-CM | POA: Diagnosis not present

## 2019-01-05 DIAGNOSIS — S52512D Displaced fracture of left radial styloid process, subsequent encounter for closed fracture with routine healing: Secondary | ICD-10-CM | POA: Diagnosis not present

## 2019-01-19 ENCOUNTER — Ambulatory Visit (INDEPENDENT_AMBULATORY_CARE_PROVIDER_SITE_OTHER): Payer: BLUE CROSS/BLUE SHIELD | Admitting: Nurse Practitioner

## 2019-01-19 ENCOUNTER — Encounter: Payer: Self-pay | Admitting: Nurse Practitioner

## 2019-01-19 ENCOUNTER — Other Ambulatory Visit: Payer: Self-pay

## 2019-01-19 DIAGNOSIS — B9789 Other viral agents as the cause of diseases classified elsewhere: Secondary | ICD-10-CM

## 2019-01-19 DIAGNOSIS — J0101 Acute recurrent maxillary sinusitis: Secondary | ICD-10-CM

## 2019-01-19 MED ORDER — DOXYCYCLINE HYCLATE 100 MG PO TABS: 100.0000 mg | ORAL_TABLET | Freq: Two times a day (BID) | ORAL | 0 refills | Status: DC

## 2019-01-19 NOTE — Progress Notes (Signed)
   Virtual Visit via telephone Note  I connected with Janet Randolph on 01/19/19 at 12:30 PM by telephone and verified that I am speaking with the correct person using two identifiers. Janet Randolph is currently located at home and no one is currently with her during visit. The provider, Mary-Margaret Daphine Deutscher, FNP is located in their office at time of visit.  I discussed the limitations, risks, security and privacy concerns of performing an evaluation and management service by telephone and the availability of in person appointments. I also discussed with the patient that there may be a patient responsible charge related to this service. The patient expressed understanding and agreed to proceed.   History and Present Illness:  Patient calls in today c/o facial pressure, cough and congestion that started over a week ago. Teeth hurt to chew. Has gotten worse the last several days   Review of Systems  Constitutional: Negative for chills and fever.  HENT: Positive for congestion and sinus pain. Negative for ear pain and sore throat.   Respiratory: Negative.   Cardiovascular: Negative.   Genitourinary: Negative.   Neurological: Positive for headaches.  Psychiatric/Behavioral: Negative.   All other systems reviewed and are negative.    Observations/Objective: Alert and oriented- answers all questions a[[ropriately No distress Slight cough noted  Assessment and Plan: Janet Randolph in today with chief complaint of Sinusitis   1. Acute recurrent maxillary sinusitis 1. Take meds as prescribed 2. Use a cool mist humidifier especially during the winter months and when heat has been humid. 3. Use saline nose sprays frequently 4. Saline irrigations of the nose can be very helpful if done frequently.  * 4X daily for 1 week*  * Use of a nettie pot can be helpful with this. Follow directions with this* 5. Drink plenty of fluids 6. Keep thermostat turn down low 7.For any cough or  congestion  Use plain Mucinex- regular strength or max strength is fine   * Children- consult with Pharmacist for dosing 8. For fever or aces or pains- take tylenol or ibuprofen appropriate for age and weight.  * for fevers greater than 101 orally you may alternate ibuprofen and tylenol every  3 hours.    - doxycycline (VIBRA-TABS) 100 MG tablet; Take 1 tablet (100 mg total) by mouth 2 (two) times daily. 1 po bid  Dispense: 14 tablet; Refill: 0    Follow Up Instructions: prn    I discussed the assessment and treatment plan with the patient. The patient was provided an opportunity to ask questions and all were answered. The patient agreed with the plan and demonstrated an understanding of the instructions.   The patient was advised to call back or seek an in-person evaluation if the symptoms worsen or if the condition fails to improve as anticipated.  The above assessment and management plan was discussed with the patient. The patient verbalized understanding of and has agreed to the management plan. Patient is aware to call the clinic if symptoms persist or worsen. Patient is aware when to return to the clinic for a follow-up visit. Patient educated on when it is appropriate to go to the emergency department.   Time call ended:  12  I provided 12 minutes of non-face-to-face time during this encounter.    Mary-Margaret Daphine Deutscher, FNP

## 2019-01-26 DIAGNOSIS — M25632 Stiffness of left wrist, not elsewhere classified: Secondary | ICD-10-CM | POA: Diagnosis not present

## 2019-02-02 DIAGNOSIS — S52512D Displaced fracture of left radial styloid process, subsequent encounter for closed fracture with routine healing: Secondary | ICD-10-CM | POA: Diagnosis not present

## 2019-02-26 ENCOUNTER — Ambulatory Visit: Payer: BLUE CROSS/BLUE SHIELD | Admitting: Nurse Practitioner

## 2019-02-26 ENCOUNTER — Telehealth: Payer: Self-pay | Admitting: Nurse Practitioner

## 2019-02-26 ENCOUNTER — Other Ambulatory Visit: Payer: Self-pay

## 2019-02-26 NOTE — Telephone Encounter (Signed)
Pt has apt on Monday with MMM states that she has a cough that she has had for 2 months states that it is allergies and that she is coughing up stuff. Advised pt that a clinical staff member would contact her to let her know if she would be able to come in the office to be seen.

## 2019-03-01 ENCOUNTER — Other Ambulatory Visit: Payer: Self-pay

## 2019-03-01 ENCOUNTER — Encounter: Payer: Self-pay | Admitting: Nurse Practitioner

## 2019-03-01 ENCOUNTER — Ambulatory Visit: Payer: BC Managed Care – PPO | Admitting: Nurse Practitioner

## 2019-03-01 VITALS — BP 133/79 | HR 71 | Temp 97.0°F | Ht 65.0 in | Wt 144.0 lb

## 2019-03-01 DIAGNOSIS — E782 Mixed hyperlipidemia: Secondary | ICD-10-CM | POA: Diagnosis not present

## 2019-03-01 DIAGNOSIS — F411 Generalized anxiety disorder: Secondary | ICD-10-CM

## 2019-03-01 DIAGNOSIS — F3342 Major depressive disorder, recurrent, in full remission: Secondary | ICD-10-CM | POA: Diagnosis not present

## 2019-03-01 DIAGNOSIS — K219 Gastro-esophageal reflux disease without esophagitis: Secondary | ICD-10-CM

## 2019-03-01 DIAGNOSIS — I10 Essential (primary) hypertension: Secondary | ICD-10-CM | POA: Diagnosis not present

## 2019-03-01 MED ORDER — ALPRAZOLAM 0.5 MG PO TABS: 0.5000 mg | ORAL_TABLET | Freq: Two times a day (BID) | ORAL | 5 refills | Status: DC | PRN

## 2019-03-01 MED ORDER — LISINOPRIL 20 MG PO TABS: 20.0000 mg | ORAL_TABLET | Freq: Every day | ORAL | 1 refills | Status: DC

## 2019-03-01 MED ORDER — ESOMEPRAZOLE MAGNESIUM 40 MG PO CPDR: 40.0000 mg | DELAYED_RELEASE_CAPSULE | Freq: Every day | ORAL | 1 refills | Status: DC

## 2019-03-01 MED ORDER — ROSUVASTATIN CALCIUM 10 MG PO TABS: 10.0000 mg | ORAL_TABLET | Freq: Every day | ORAL | 1 refills | Status: DC

## 2019-03-01 MED ORDER — VENLAFAXINE HCL ER 150 MG PO CP24: 150.0000 mg | ORAL_CAPSULE | Freq: Every day | ORAL | 1 refills | Status: DC

## 2019-03-01 NOTE — Patient Instructions (Signed)

## 2019-03-01 NOTE — Progress Notes (Addendum)
Subjective:    Patient ID: Janet Randolph, female    DOB: [birth year], 58 y.o.   MRN: [id number]   Chief Complaint: medical management of chronic issues   HPI:  1. Essential hypertension No c/o chest pain, sob or headache. Does not check blood pressure at home BP Readings from Last 3 Encounters:  08/27/18 136/82  03/10/18 132/79  12/04/17 127/79     2. Mixed hyperlipidemia Not watching diet and does very little dedicated exercise  3. Gastroesophageal reflux disease, esophagitis presence not specified Is currently on nexium daily and works well to keep symptoms under control  4. Recurrent major depressive disorder, in full remission [facility]) Has been on effexor for several years and is working well for her. She denies nay side effects. Depression screen [facility] 2/9 03/01/2019 08/27/2018 03/10/2018  Decreased Interest 0 0 0  Down, Depressed, Hopeless 0 0 0  PHQ - 2 Score 0 0 0  Altered sleeping - - -  Tired, decreased energy - - -  Change in appetite - - -  Feeling bad or failure about yourself  - - -  Trouble concentrating - - -  Moving slowly or fidgety/restless - - -  Suicidal thoughts - - -  PHQ-9 Score - - -    5. GAD (generalized anxiety disorder) Is on xanax that she takes BID. She gets very anxious if she does not take meds    Outpatient Encounter Medications as of 03/01/2019  Medication Sig  . ALPRAZolam (XANAX) 0.5 MG tablet Take 1 tablet (0.5 mg total) by mouth 2 (two) times daily as needed.  . desonide (DESOWEN) 0.05 % cream Apply topically 2 (two) times daily.  Marland Kitchen doxycycline (VIBRA-TABS) 100 MG tablet Take 1 tablet (100 mg total) by mouth 2 (two) times daily. 1 po bid  . esomeprazole (NEXIUM) 40 MG capsule Take 1 capsule (40 mg total) by mouth daily. Take 30 min before meal  . lisinopril (ZESTRIL) 20 MG tablet TAKE 1 TABLET (20 MG TOTAL) BY MOUTH DAILY. (NEEDS TO BE SEEN BEFORE NEXT REFILL)  . rosuvastatin (CRESTOR) 10 MG tablet Take 1 tablet (10 mg total) by  mouth daily.  Marland Kitchen venlafaxine XR (EFFEXOR-XR) 150 MG 24 hr capsule Take 1 capsule (150 mg total) by mouth daily with breakfast.     Past Surgical History:  Procedure Laterality Date  . HAND SURGERY      Family History  Problem Relation Age of Onset  . Asthma Father   . Crohn's disease Father     New complaints: None today  Social history: Lives by herself. Her daughter checks on her frequently     Review of Systems  Constitutional: Negative for activity change and appetite change.  HENT: Negative.   Eyes: Negative for pain.  Respiratory: Negative for shortness of breath.   Cardiovascular: Negative for chest pain, palpitations and leg swelling.  Gastrointestinal: Negative for abdominal pain.  Endocrine: Negative for polydipsia.  Genitourinary: Negative.   Skin: Negative for rash.  Neurological: Negative for dizziness, weakness and headaches.  Hematological: Does not bruise/bleed easily.  Psychiatric/Behavioral: Negative.   All other systems reviewed and are negative.      Objective:   Physical Exam Vitals signs and nursing note reviewed.  Constitutional:      General: She is not in acute distress.    Appearance: Normal appearance. She is well-developed.  HENT:     Head: Normocephalic.     Nose: Nose normal.  Eyes:  Pupils: Pupils are equal, round, and reactive to light.  Neck:     Musculoskeletal: Normal range of motion and neck supple.     Vascular: No carotid bruit or JVD.  Cardiovascular:     Rate and Rhythm: Normal rate and regular rhythm.     Heart sounds: Normal heart sounds.  Pulmonary:     Effort: Pulmonary effort is normal. No respiratory distress.     Breath sounds: Normal breath sounds. No wheezing or rales.  Chest:     Chest wall: No tenderness.  Abdominal:     General: Bowel sounds are normal. There is no distension or abdominal bruit.     Palpations: Abdomen is soft. There is no hepatomegaly, splenomegaly, mass or pulsatile mass.      Tenderness: There is no abdominal tenderness.  Musculoskeletal: Normal range of motion.  Lymphadenopathy:     Cervical: No cervical adenopathy.  Skin:    General: Skin is warm and dry.  Neurological:     Mental Status: She is alert and oriented to person, place, and time.     Deep Tendon Reflexes: Reflexes are normal and symmetric.  Psychiatric:        Behavior: Behavior normal.        Thought Content: Thought content normal.        Judgment: Judgment normal.    BP 133/79   Pulse 71   Temp (!) 97 F (36.1 C) (Oral)   Ht 5' 5"  (1.651 m)   Wt 144 lb (65.3 kg)   BMI 23.96 kg/m         Assessment & Plan:  Janet Randolph comes in today with chief complaint of Medical Management of Chronic Issues   Diagnosis and orders addressed:  1. Essential hypertension Low sodium diet - lisinopril (ZESTRIL) 20 MG tablet; Take 1 tablet (20 mg total) by mouth daily. (Needs to be seen before next refill)  Dispense: 90 tablet; Refill: 1  2. Mixed hyperlipidemia Low fat diet - rosuvastatin (CRESTOR) 10 MG tablet; Take 1 tablet (10 mg total) by mouth daily.  Dispense: 90 tablet; Refill: 1  3. Gastroesophageal reflux disease, esophagitis presence not specified Avoid spicy foods Do not eat 2 hours prior to bedtime - esomeprazole (NEXIUM) 40 MG capsule; Take 1 capsule (40 mg total) by mouth daily. Take 30 min before meal  Dispense: 90 capsule; Refill: 1  4. Recurrent major depressive disorder, in full remission ([place]) Stress management - venlafaxine XR (EFFEXOR-XR) 150 MG 24 hr capsule; Take 1 capsule (150 mg total) by mouth daily with breakfast.  Dispense: 90 capsule; Refill: 1  5. GAD (generalized anxiety disorder) - ALPRAZolam (XANAX) 0.5 MG tablet; Take 1 tablet (0.5 mg total) by mouth 2 (two) times daily as needed.  Dispense: 60 tablet; Refill: 5  Orders Placed This Encounter  Procedures  . CMP14+EGFR  . Lipid panel    Labs pending Health Maintenance reviewed Diet and  exercise encouraged  Follow up plan: 6 months   Mary-Margaret Hassell Done, FNP

## 2019-03-01 NOTE — Addendum Note (Signed)
Addended by: Chevis Pretty on: 03/01/2019 02:55 PM   Modules accepted: Orders

## 2019-03-02 LAB — CMP14+EGFR
ALT: 9 IU/L (ref 0–32)
AST: 14 IU/L (ref 0–40)
Albumin/Globulin Ratio: 2.2 (ref 1.2–2.2)
Albumin: 4.2 g/dL (ref 3.8–4.9)
Alkaline Phosphatase: 59 IU/L (ref 39–117)
BUN/Creatinine Ratio: 29 — ABNORMAL HIGH (ref 9–23)
BUN: 19 mg/dL (ref 6–24)
Bilirubin Total: <0.2 mg/dL (ref 0.0–1.2)
CO2: 22 mmol/L (ref 20–29)
Calcium: 8.8 mg/dL (ref 8.7–10.2)
Chloride: 105 mmol/L (ref 96–106)
Creatinine, Ser: 0.66 mg/dL (ref 0.57–1.00)
GFR calc Af Amer: 112 mL/min/{1.73_m2} (ref >59)
GFR calc non Af Amer: 97 mL/min/{1.73_m2} (ref >59)
Globulin, Total: 1.9 g/dL (ref 1.5–4.5)
Glucose: 75 mg/dL (ref 65–99)
Potassium: 3.9 mmol/L (ref 3.5–5.2)
Sodium: 141 mmol/L (ref 134–144)
Total Protein: 6.1 g/dL (ref 6.0–8.5)

## 2019-03-02 LAB — LIPID PANEL
Chol/HDL Ratio: 3 ratio (ref 0.0–4.4)
Cholesterol, Total: 160 mg/dL (ref 100–199)
HDL: 53 mg/dL (ref >39)
LDL Calculated: 80 mg/dL (ref 0–99)
Triglycerides: 137 mg/dL (ref 0–149)
VLDL Cholesterol Cal: 27 mg/dL (ref 5–40)

## 2019-03-04 NOTE — Telephone Encounter (Signed)
Patient ok'd to be seen and visit done

## 2019-06-02 DIAGNOSIS — Z20828 Contact with and (suspected) exposure to other viral communicable diseases: Secondary | ICD-10-CM | POA: Diagnosis not present

## 2019-06-11 ENCOUNTER — Encounter: Payer: Self-pay | Admitting: *Deleted

## 2019-07-09 ENCOUNTER — Other Ambulatory Visit: Payer: Self-pay | Admitting: Nurse Practitioner

## 2019-07-09 DIAGNOSIS — K219 Gastro-esophageal reflux disease without esophagitis: Secondary | ICD-10-CM

## 2019-08-25 ENCOUNTER — Other Ambulatory Visit: Payer: Self-pay

## 2019-08-26 ENCOUNTER — Ambulatory Visit (INDEPENDENT_AMBULATORY_CARE_PROVIDER_SITE_OTHER): Payer: BC Managed Care – PPO | Admitting: Nurse Practitioner

## 2019-08-26 ENCOUNTER — Encounter: Payer: Self-pay | Admitting: Nurse Practitioner

## 2019-08-26 VITALS — BP 136/86 | HR 75 | Temp 98.7°F | Resp 20 | Ht 65.0 in | Wt 155.0 lb

## 2019-08-26 DIAGNOSIS — K219 Gastro-esophageal reflux disease without esophagitis: Secondary | ICD-10-CM | POA: Diagnosis not present

## 2019-08-26 DIAGNOSIS — I10 Essential (primary) hypertension: Secondary | ICD-10-CM

## 2019-08-26 DIAGNOSIS — E782 Mixed hyperlipidemia: Secondary | ICD-10-CM | POA: Diagnosis not present

## 2019-08-26 DIAGNOSIS — F3342 Major depressive disorder, recurrent, in full remission: Secondary | ICD-10-CM

## 2019-08-26 DIAGNOSIS — F411 Generalized anxiety disorder: Secondary | ICD-10-CM

## 2019-08-26 MED ORDER — ALPRAZOLAM 0.5 MG PO TABS: 0.5000 mg | ORAL_TABLET | Freq: Two times a day (BID) | ORAL | 5 refills | Status: DC | PRN

## 2019-08-26 MED ORDER — ROSUVASTATIN CALCIUM 10 MG PO TABS: 10.0000 mg | ORAL_TABLET | Freq: Every day | ORAL | 1 refills | Status: DC

## 2019-08-26 MED ORDER — LISINOPRIL 20 MG PO TABS: 20.0000 mg | ORAL_TABLET | Freq: Every day | ORAL | 1 refills | Status: DC

## 2019-08-26 MED ORDER — VENLAFAXINE HCL ER 75 MG PO CP24: ORAL_CAPSULE | ORAL | 1 refills | Status: DC

## 2019-08-26 NOTE — Patient Instructions (Signed)

## 2019-08-26 NOTE — Addendum Note (Signed)
Addended by: Chevis Pretty on: 08/26/2019 02:29 PM   Modules accepted: Orders

## 2019-08-26 NOTE — Progress Notes (Signed)
Subjective:    Patient ID: Janet Randolph, female    DOB: 08-May-1961, 58 y.o.   MRN: [id number]   Chief Complaint: Medical Management of Chronic Issues    HPI:  1. Essential hypertension No c/o chest pain, sob or headache. Does not check bloodpressure at home. BP Readings from Last 3 Encounters:  03/01/19 133/79  08/27/18 136/82  03/10/18 132/79     2. Mixed hyperlipidemia Does try to watch diet. Does no exercise. Lab Results  Component Value Date   CHOL 160 03/01/2019   HDL 53 03/01/2019   LDLCALC 80 03/01/2019   TRIG 137 03/01/2019   CHOLHDL 3.0 03/01/2019     3. Gastroesophageal reflux disease, unspecified whether esophagitis present Is on nexium daily and is working well.  4. Recurrent major depressive disorder, in full remission (HCC) Likes to take effexor 75mg - some days she takes 2 and other days she takes one. Depression screen [facility] 2/9 08/26/2019 03/01/2019 08/27/2018  Decreased Interest 0 0 0  Down, Depressed, Hopeless 0 0 0  PHQ - 2 Score 0 0 0  Altered sleeping - - -  Tired, decreased energy - - -  Change in appetite - - -  Feeling bad or failure about yourself  - - -  Trouble concentrating - - -  Moving slowly or fidgety/restless - - -  Suicidal thoughts - - -  PHQ-9 Score - - -    5. GAD (generalized anxiety disorder) ison xanax BD and is doing well. GAD 7 : Generalized Anxiety Score 08/26/2019 06/23/2017 12/07/2015  Nervous, Anxious, on Edge 2 1 1   Control/stop worrying 0 1 1  Worry too much - different things 0 1 2  Trouble relaxing 0 0 0  Restless 0 1 0  Easily annoyed or irritable 0 0 0  Afraid - awful might happen 0 0 0  Total GAD 7 Score 2 4 4   Anxiety Difficulty Not difficult at all Somewhat difficult Not difficult at all        Outpatient Encounter Medications as of 08/26/2019  Medication Sig  . ALPRAZolam (XANAX) 0.5 MG tablet Take 1 tablet (0.5 mg total) by mouth 2 (two) times daily as needed.  . desonide (DESOWEN) 0.05  % cream Apply topically 2 (two) times daily.  esomeprazole (NEXIUM) 40 MG capsule TAKE 1 CAPSULE (40 MG TOTAL) BY MOUTH DAILY. TAKE 30 MIN BEFORE MEAL  . lisinopril (ZESTRIL) 20 MG tablet Take 1 tablet (20 mg total) by mouth daily. (Needs to be seen before next refill)  . rosuvastatin (CRESTOR) 10 MG tablet Take 1 tablet (10 mg total) by mouth daily.  venlafaxine XR (EFFEXOR-XR) 150 MG 24 hr capsule Take 1 capsule (150 mg total) by mouth daily with breakfast.     Past Surgical History:  Procedure Laterality Date  . HAND SURGERY      Family History  Problem Relation Age of Onset  . Asthma Father   . Crohn's disease Father     New complaints: None today  Social history: Lives by herself  Controlled substance contract: 07/27/19    Review of Systems  Constitutional: Negative for diaphoresis.  Eyes: Negative for pain.  Respiratory: Negative for shortness of breath.   Cardiovascular: Negative for chest pain, palpitations and leg swelling.  Gastrointestinal: Negative for abdominal pain.  Endocrine: Negative for polydipsia.  Skin: Negative for rash.  Neurological: Negative for dizziness, weakness and headaches.  Hematological: Does not bruise/bleed easily.  All other systems reviewed and  are negative.      Objective:   Physical Exam Vitals and nursing note reviewed.  Constitutional:      General: She is not in acute distress.    Appearance: Normal appearance. She is well-developed.  HENT:     Head: Normocephalic.     Nose: Nose normal.  Eyes:     Pupils: Pupils are equal, round, and reactive to light.  Neck:     Vascular: No carotid bruit or JVD.  Cardiovascular:     Rate and Rhythm: Normal rate and regular rhythm.     Heart sounds: Normal heart sounds.  Pulmonary:     Effort: Pulmonary effort is normal. No respiratory distress.     Breath sounds: Normal breath sounds. No wheezing or rales.  Chest:     Chest wall: No tenderness.  Abdominal:     General:  Bowel sounds are normal. There is no distension or abdominal bruit.     Palpations: Abdomen is soft. There is no hepatomegaly, splenomegaly, mass or pulsatile mass.     Tenderness: There is no abdominal tenderness.  Musculoskeletal:        General: Normal range of motion.     Cervical back: Normal range of motion and neck supple.  Lymphadenopathy:     Cervical: No cervical adenopathy.  Skin:    General: Skin is warm and dry.  Neurological:     Mental Status: She is alert and oriented to person, place, and time.     Deep Tendon Reflexes: Reflexes are normal and symmetric.  Psychiatric:        Behavior: Behavior normal.        Thought Content: Thought content normal.        Judgment: Judgment normal.    BP 136/86 (BP Location: Left Arm, Cuff Size: Normal)   Pulse 75   Temp 98.7 F (37.1 C) (Temporal)   Resp 20   Ht 5\' 5"  (1.651 m)   Wt 155 lb (70.3 kg)   SpO2 98%   BMI 25.79 kg/m        Assessment & Plan:  [name] comes in today with chief complaint of Medical Management of Chronic Issues   Diagnosis and orders addressed:  1. Essential hypertension Low sodium diet - lisinopril (ZESTRIL) 20 MG tablet; Take 1 tablet (20 mg total) by mouth daily. (Needs to be seen before next refill)  Dispense: 90 tablet; Refill: 1  2. Mixed hyperlipidemia Low fat diet - rosuvastatin (CRESTOR) 10 MG tablet; Take 1 tablet (10 mg total) by mouth daily.  Dispense: 90 tablet; Refill: 1  3. Gastroesophageal reflux disease, unspecified whether esophagitis present Avoid spicy foods Do not eat 2 hours prior to bedtime  4. Recurrent major depressive disorder, in full remission ([place]) stress management - venlafaxine XR (EFFEXOR XR) 75 MG 24 hr capsule; 1-2po qd  Dispense: 180 capsule; Refill: 1  5. GAD (generalized anxiety disorder) - ALPRAZolam (XANAX) 0.5 MG tablet; Take 1 tablet (0.5 mg total) by mouth 2 (two) times daily as needed.  Dispense: 60 tablet; Refill: 5   Labs  pending Health Maintenance reviewed Diet and exercise encouraged  Follow up plan: 6 months   Mary-Margaret Hassell Done, FNP

## 2019-08-27 LAB — CMP14+EGFR
ALT: 9 IU/L (ref 0–32)
AST: 16 IU/L (ref 0–40)
Albumin/Globulin Ratio: 1.9 (ref 1.2–2.2)
Albumin: 4 g/dL (ref 3.8–4.9)
Alkaline Phosphatase: 59 IU/L (ref 39–117)
BUN/Creatinine Ratio: 20 (ref 12–28)
BUN: 15 mg/dL (ref 8–27)
Bilirubin Total: <0.2 mg/dL (ref 0.0–1.2)
CO2: 22 mmol/L (ref 20–29)
Calcium: 8.4 mg/dL — ABNORMAL LOW (ref 8.7–10.3)
Chloride: 108 mmol/L — ABNORMAL HIGH (ref 96–106)
Creatinine, Ser: 0.74 mg/dL (ref 0.57–1.00)
GFR calc Af Amer: 102 mL/min/{1.73_m2} (ref >59)
GFR calc non Af Amer: 88 mL/min/{1.73_m2} (ref >59)
Globulin, Total: 2.1 g/dL (ref 1.5–4.5)
Glucose: 80 mg/dL (ref 65–99)
Potassium: 4.1 mmol/L (ref 3.5–5.2)
Sodium: 144 mmol/L (ref 134–144)
Total Protein: 6.1 g/dL (ref 6.0–8.5)

## 2019-08-27 LAB — LIPID PANEL
Chol/HDL Ratio: 2.8 ratio (ref 0.0–4.4)
Cholesterol, Total: 152 mg/dL (ref 100–199)
HDL: 55 mg/dL (ref >39)
LDL Chol Calc (NIH): 68 mg/dL (ref 0–99)
Triglycerides: 172 mg/dL — ABNORMAL HIGH (ref 0–149)
VLDL Cholesterol Cal: 29 mg/dL (ref 5–40)

## 2019-08-31 ENCOUNTER — Ambulatory Visit: Payer: Self-pay | Admitting: Nurse Practitioner

## 2019-09-16 ENCOUNTER — Other Ambulatory Visit: Payer: Self-pay | Admitting: Nurse Practitioner

## 2019-09-16 DIAGNOSIS — F411 Generalized anxiety disorder: Secondary | ICD-10-CM

## 2019-10-19 ENCOUNTER — Other Ambulatory Visit: Payer: Self-pay | Admitting: Nurse Practitioner

## 2019-10-19 DIAGNOSIS — F3342 Major depressive disorder, recurrent, in full remission: Secondary | ICD-10-CM

## 2020-01-14 ENCOUNTER — Other Ambulatory Visit: Payer: Self-pay | Admitting: Nurse Practitioner

## 2020-01-14 DIAGNOSIS — E782 Mixed hyperlipidemia: Secondary | ICD-10-CM

## 2020-01-17 ENCOUNTER — Other Ambulatory Visit: Payer: Self-pay | Admitting: Nurse Practitioner

## 2020-01-17 ENCOUNTER — Encounter: Payer: Self-pay | Admitting: *Deleted

## 2020-01-17 DIAGNOSIS — K219 Gastro-esophageal reflux disease without esophagitis: Secondary | ICD-10-CM

## 2020-02-20 ENCOUNTER — Other Ambulatory Visit: Payer: Self-pay | Admitting: Nurse Practitioner

## 2020-02-20 DIAGNOSIS — F3342 Major depressive disorder, recurrent, in full remission: Secondary | ICD-10-CM

## 2020-02-20 DIAGNOSIS — I10 Essential (primary) hypertension: Secondary | ICD-10-CM

## 2020-02-22 ENCOUNTER — Other Ambulatory Visit: Payer: Self-pay

## 2020-02-22 ENCOUNTER — Encounter: Payer: Self-pay | Admitting: Nurse Practitioner

## 2020-02-22 ENCOUNTER — Ambulatory Visit: Payer: BC Managed Care – PPO | Admitting: Nurse Practitioner

## 2020-02-22 VITALS — BP 136/81 | HR 73 | Temp 97.4°F | Resp 20 | Ht 65.0 in | Wt 149.0 lb

## 2020-02-22 DIAGNOSIS — F411 Generalized anxiety disorder: Secondary | ICD-10-CM

## 2020-02-22 DIAGNOSIS — I10 Essential (primary) hypertension: Secondary | ICD-10-CM

## 2020-02-22 DIAGNOSIS — K219 Gastro-esophageal reflux disease without esophagitis: Secondary | ICD-10-CM | POA: Diagnosis not present

## 2020-02-22 DIAGNOSIS — F3342 Major depressive disorder, recurrent, in full remission: Secondary | ICD-10-CM

## 2020-02-22 DIAGNOSIS — E782 Mixed hyperlipidemia: Secondary | ICD-10-CM | POA: Diagnosis not present

## 2020-02-22 MED ORDER — LISINOPRIL 20 MG PO TABS: 20.0000 mg | ORAL_TABLET | Freq: Every day | ORAL | 1 refills | Status: DC

## 2020-02-22 MED ORDER — ROSUVASTATIN CALCIUM 10 MG PO TABS: 10.0000 mg | ORAL_TABLET | Freq: Every day | ORAL | 1 refills | Status: DC

## 2020-02-22 MED ORDER — ALPRAZOLAM 0.5 MG PO TABS: 0.5000 mg | ORAL_TABLET | Freq: Two times a day (BID) | ORAL | 5 refills | Status: DC | PRN

## 2020-02-22 MED ORDER — ESOMEPRAZOLE MAGNESIUM 40 MG PO CPDR: 40.0000 mg | DELAYED_RELEASE_CAPSULE | Freq: Every day | ORAL | 0 refills | Status: DC

## 2020-02-22 MED ORDER — VENLAFAXINE HCL ER 75 MG PO CP24: ORAL_CAPSULE | ORAL | 1 refills | Status: DC

## 2020-02-22 NOTE — Addendum Note (Signed)
Addended by: Bennie Pierini on: 02/22/2020 12:57 PM   Modules accepted: Orders

## 2020-02-22 NOTE — Progress Notes (Signed)
Subjective:    Patient ID: Janet Randolph, female    DOB: Oct 08, 1960, 59 y.o.   MRN: [id number]   Chief Complaint: Medical Management of Chronic Issues    HPI:  1. Essential hypertension No c/o chest pain, sob or headache. Does not check blood pressure at home. BP Readings from Last 3 Encounters:  02/22/20 136/81  08/26/19 136/86  03/01/19 133/79     2. Mixed hyperlipidemia Tries to watch diet. Does no dedicated exercise. Lab Results  Component Value Date   CHOL 152 08/26/2019   HDL 55 08/26/2019   LDLCALC 68 08/26/2019   TRIG 172 (H) 08/26/2019   CHOLHDL 2.8 08/26/2019     3. Gastroesophageal reflux disease, unspecified whether esophagitis present Takes nexium daily and is doing well.   4. GAD (generalized anxiety disorder) Is on xanax BID and she is doing well. GAD 7 : Generalized Anxiety Score 02/22/2020 08/26/2019 06/23/2017 12/07/2015  Nervous, Anxious, on Edge 3 2 1 1   Control/stop worrying 0 0 1 1  Worry too much - different things 0 0 1 2  Trouble relaxing 0 0 0 0  Restless 0 0 1 0  Easily annoyed or irritable 1 0 0 0  Afraid - awful might happen 0 0 0 0  Total GAD 7 Score 4 2 4 4   Anxiety Difficulty - Not difficult at all Somewhat difficult Not difficult at all      5. Recurrent major depressive disorder, in full remission ([place]) Is on effexor daily and is doing well. Depression screen [facility] 2/9 02/22/2020 08/26/2019 03/01/2019  Decreased Interest 0 0 0  Down, Depressed, Hopeless 0 0 0  PHQ - 2 Score 0 0 0  Altered sleeping - - -  Tired, decreased energy - - -  Change in appetite - - -  Feeling bad or failure about yourself  - - -  Trouble concentrating - - -  Moving slowly or fidgety/restless - - -  Suicidal thoughts - - -  PHQ-9 Score - - -       Outpatient Encounter Medications as of 02/22/2020  Medication Sig  . ALPRAZolam (XANAX) 0.5 MG tablet Take 1 tablet (0.5 mg total) by mouth 2 (two) times daily as needed.  . desonide (DESOWEN)  0.05 % cream Apply topically 2 (two) times daily.  Marland Kitchen esomeprazole (NEXIUM) 40 MG capsule TAKE 1 CAPSULE (40 MG TOTAL) BY MOUTH DAILY. TAKE 30 MIN BEFORE MEAL  . lisinopril (ZESTRIL) 20 MG tablet Take 1 tablet (20 mg total) by mouth daily.  . rosuvastatin (CRESTOR) 10 MG tablet TAKE 1 TABLET BY MOUTH EVERY DAY  . venlafaxine XR (EFFEXOR XR) 75 MG 24 hr capsule 1-2po qd    Past Surgical History:  Procedure Laterality Date  . HAND SURGERY      Family History  Problem Relation Age of Onset  . Asthma Father   . Crohn's disease Father     New complaints: None today  Social history: Lives by herself  Controlled substance contract: n/a    Review of Systems  Constitutional: Negative for diaphoresis.  Eyes: Negative for pain.  Respiratory: Negative for shortness of breath.   Cardiovascular: Negative for chest pain, palpitations and leg swelling.  Gastrointestinal: Negative for abdominal pain.  Endocrine: Negative for polydipsia.  Skin: Negative for rash.  Neurological: Negative for dizziness, weakness and headaches.  Hematological: Does not bruise/bleed easily.  All other systems reviewed and are negative.      Objective:   Physical  Exam Vitals and nursing note reviewed.  Constitutional:      General: She is not in acute distress.    Appearance: Normal appearance. She is well-developed.  HENT:     Head: Normocephalic.     Nose: Nose normal.  Eyes:     Pupils: Pupils are equal, round, and reactive to light.  Neck:     Vascular: No carotid bruit or JVD.  Cardiovascular:     Rate and Rhythm: Normal rate and regular rhythm.     Heart sounds: Normal heart sounds.  Pulmonary:     Effort: Pulmonary effort is normal. No respiratory distress.     Breath sounds: Normal breath sounds. No wheezing or rales.  Chest:     Chest wall: No tenderness.  Abdominal:     General: Bowel sounds are normal. There is no distension or abdominal bruit.     Palpations: Abdomen is soft.  There is no hepatomegaly, splenomegaly, mass or pulsatile mass.     Tenderness: There is no abdominal tenderness.  Musculoskeletal:        General: Normal range of motion.     Cervical back: Normal range of motion and neck supple.  Lymphadenopathy:     Cervical: No cervical adenopathy.  Skin:    General: Skin is warm and dry.  Neurological:     Mental Status: She is alert and oriented to person, place, and time.     Deep Tendon Reflexes: Reflexes are normal and symmetric.  Psychiatric:        Behavior: Behavior normal.        Thought Content: Thought content normal.        Judgment: Judgment normal.    BP 136/81   Pulse 73   Temp (!) 97.4 F (36.3 C) (Temporal)   Resp 20   Ht 5\' 5"  (1.651 m)   Wt 149 lb (67.6 kg)   SpO2 95%   BMI 24.79 kg/m         Assessment & Plan:  KYNISHA MEMON comes in today with chief complaint of Medical Management of Chronic Issues   Diagnosis and orders addressed:  1. Essential hypertension Low sodium diet - lisinopril (ZESTRIL) 20 MG tablet; Take 1 tablet (20 mg total) by mouth daily.  Dispense: 90 tablet; Refill: 1  2. Mixed hyperlipidemia Low fat diet - rosuvastatin (CRESTOR) 10 MG tablet; Take 1 tablet (10 mg total) by mouth daily.  Dispense: 90 tablet; Refill: 1  3. Gastroesophageal reflux disease, unspecified whether esophagitis present Avoid spicy foods Do not eat 2 hours prior to bedtime   4. GAD (generalized anxiety disorder) Stress management - ALPRAZolam (XANAX) 0.5 MG tablet; Take 1 tablet (0.5 mg total) by mouth 2 (two) times daily as needed.  Dispense: 60 tablet; Refill: 5  5. Recurrent major depressive disorder, in full remission (HCC) - venlafaxine XR (EFFEXOR XR) 75 MG 24 hr capsule; 1-2po qd  Dispense: 180 capsule; Refill: 1  6. Gastroesophageal reflux disease Avoid spicy foods Do not eat 2 hours prior to bedtime - esomeprazole (NEXIUM) 40 MG capsule; Take 1 capsule (40 mg total) by mouth daily. Take 30 min  before meal  Dispense: 90 capsule; Refill: 0   Labs pending Health Maintenance reviewed Diet and exercise encouraged  Follow up plan: 6 months   Mary-Margaret Stephania Fragmin, FNP

## 2020-02-22 NOTE — Patient Instructions (Signed)
Exercising to Stay Healthy To become healthy and stay healthy, it is recommended that you do moderate-intensity and vigorous-intensity exercise. You can tell that you are exercising at a moderate intensity if your heart starts beating faster and you start breathing faster but can still hold a conversation. You can tell that you are exercising at a vigorous intensity if you are breathing much harder and faster and cannot hold a conversation while exercising. Exercising regularly is important. It has many health benefits, such as:  Improving overall fitness, flexibility, and endurance.  Increasing bone density.  Helping with weight control.  Decreasing body fat.  Increasing muscle strength.  Reducing stress and tension.  Improving overall health. How often should I exercise? Choose an activity that you enjoy, and set realistic goals. Your health care provider can help you make an activity plan that works for you. Exercise regularly as told by your health care provider. This may include:  Doing strength training two times a week, such as: ? Lifting weights. ? Using resistance bands. ? Push-ups. ? Sit-ups. ? Yoga.  Doing a certain intensity of exercise for a given amount of time. Choose from these options: ? A total of 150 minutes of moderate-intensity exercise every week. ? A total of 75 minutes of vigorous-intensity exercise every week. ? A mix of moderate-intensity and vigorous-intensity exercise every week. Children, pregnant women, people who have not exercised regularly, people who are overweight, and older adults may need to talk with a health care provider about what activities are safe to do. If you have a medical condition, be sure to talk with your health care provider before you start a new exercise program. What are some exercise ideas? Moderate-intensity exercise ideas include:  Walking 1 mile (1.6 km) in about 15  minutes.  Biking.  Hiking.  Golfing.  Dancing.  Water aerobics. Vigorous-intensity exercise ideas include:  Walking 4.5 miles (7.2 km) or more in about 1 hour.  Jogging or running 5 miles (8 km) in about 1 hour.  Biking 10 miles (16.1 km) or more in about 1 hour.  Lap swimming.  Roller-skating or in-line skating.  Cross-country skiing.  Vigorous competitive sports, such as football, basketball, and soccer.  Jumping rope.  Aerobic dancing. What are some everyday activities that can help me to get exercise?  Yard work, such as: ? Pushing a lawn mower. ? Raking and bagging leaves.  Washing your car.  Pushing a stroller.  Shoveling snow.  Gardening.  Washing windows or floors. How can I be more active in my day-to-day activities?  Use stairs instead of an elevator.  Take a walk during your lunch break.  If you drive, park your car farther away from your work or school.  If you take public transportation, get off one stop early and walk the rest of the way.  Stand up or walk around during all of your indoor phone calls.  Get up, stretch, and walk around every 30 minutes throughout the day.  Enjoy exercise with a friend. Support to continue exercising will help you keep a regular routine of activity. What guidelines can I follow while exercising?  Before you start a new exercise program, talk with your health care provider.  Do not exercise so much that you hurt yourself, feel dizzy, or get very short of breath.  Wear comfortable clothes and wear shoes with good support.  Drink plenty of water while you exercise to prevent dehydration or heat stroke.  Work out until your breathing   and your heartbeat get faster. Where to find more information  [place] Department of Health and Human Services: www.hhs.gov  Centers for Disease Control and Prevention (CDC): www.cdc.gov Summary  Exercising regularly is important. It will improve your overall fitness,  flexibility, and endurance.  Regular exercise also will improve your overall health. It can help you control your weight, reduce stress, and improve your bone density.  Do not exercise so much that you hurt yourself, feel dizzy, or get very short of breath.  Before you start a new exercise program, talk with your health care provider. This information is not intended to replace advice given to you by your health care provider. Make sure you discuss any questions you have with your health care provider. Document Revised: 08/08/2017 Document Reviewed: 07/17/2017 Elsevier Patient Education  2020 [employer]  

## 2020-02-23 LAB — CMP14+EGFR
ALT: 10 IU/L (ref 0–32)
AST: 15 IU/L (ref 0–40)
Albumin/Globulin Ratio: 1.6 (ref 1.2–2.2)
Albumin: 4.2 g/dL (ref 3.8–4.9)
Alkaline Phosphatase: 64 IU/L (ref 48–121)
BUN/Creatinine Ratio: 28 (ref 12–28)
BUN: 18 mg/dL (ref 8–27)
Bilirubin Total: <0.2 mg/dL (ref 0.0–1.2)
CO2: 24 mmol/L (ref 20–29)
Calcium: 9.1 mg/dL (ref 8.7–10.3)
Chloride: 106 mmol/L (ref 96–106)
Creatinine, Ser: 0.65 mg/dL (ref 0.57–1.00)
GFR calc Af Amer: 112 mL/min/{1.73_m2} (ref >59)
GFR calc non Af Amer: 97 mL/min/{1.73_m2} (ref >59)
Globulin, Total: 2.6 g/dL (ref 1.5–4.5)
Glucose: 84 mg/dL (ref 65–99)
Potassium: 4.1 mmol/L (ref 3.5–5.2)
Sodium: 144 mmol/L (ref 134–144)
Total Protein: 6.8 g/dL (ref 6.0–8.5)

## 2020-02-23 LAB — CBC WITH DIFFERENTIAL/PLATELET
Basophils Absolute: 0 10*3/uL (ref 0.0–0.2)
Basos: 1 %
EOS (ABSOLUTE): 0.1 10*3/uL (ref 0.0–0.4)
Eos: 2 %
Hematocrit: 39.2 % (ref 34.0–46.6)
Hemoglobin: 13.1 g/dL (ref 11.1–15.9)
Immature Grans (Abs): 0 10*3/uL (ref 0.0–0.1)
Immature Granulocytes: 0 %
Lymphocytes Absolute: 1.9 10*3/uL (ref 0.7–3.1)
Lymphs: 32 %
MCH: 33 pg (ref 26.6–33.0)
MCHC: 33.4 g/dL (ref 31.5–35.7)
MCV: 99 fL — ABNORMAL HIGH (ref 79–97)
Monocytes Absolute: 0.4 10*3/uL (ref 0.1–0.9)
Monocytes: 7 %
Neutrophils Absolute: 3.4 10*3/uL (ref 1.4–7.0)
Neutrophils: 58 %
Platelets: 361 10*3/uL (ref 150–450)
RBC: 3.97 x10E6/uL (ref 3.77–5.28)
RDW: 12.5 % (ref 11.7–15.4)
WBC: 5.9 10*3/uL (ref 3.4–10.8)

## 2020-02-23 LAB — LIPID PANEL
Chol/HDL Ratio: 3.1 ratio (ref 0.0–4.4)
Cholesterol, Total: 169 mg/dL (ref 100–199)
HDL: 55 mg/dL (ref >39)
LDL Chol Calc (NIH): 86 mg/dL (ref 0–99)
Triglycerides: 164 mg/dL — ABNORMAL HIGH (ref 0–149)
VLDL Cholesterol Cal: 28 mg/dL (ref 5–40)

## 2020-02-24 ENCOUNTER — Ambulatory Visit: Payer: Self-pay | Admitting: Nurse Practitioner

## 2020-03-22 ENCOUNTER — Other Ambulatory Visit: Payer: Self-pay

## 2020-03-22 ENCOUNTER — Telehealth (INDEPENDENT_AMBULATORY_CARE_PROVIDER_SITE_OTHER): Payer: BC Managed Care – PPO | Admitting: Family Medicine

## 2020-03-22 ENCOUNTER — Encounter: Payer: Self-pay | Admitting: Family Medicine

## 2020-03-22 DIAGNOSIS — J019 Acute sinusitis, unspecified: Secondary | ICD-10-CM

## 2020-03-22 MED ORDER — CEFDINIR 300 MG PO CAPS: 300.0000 mg | ORAL_CAPSULE | Freq: Two times a day (BID) | ORAL | 0 refills | Status: AC

## 2020-03-22 NOTE — Progress Notes (Signed)
Virtual Visit via Video note  I connected with Stephania Fragmin on 03/22/20 at 5:45 PM by video and verified that I am speaking with the correct person using two identifiers. Janet Randolph is currently located at home and nobody is currently with her during visit. The provider, Gwenlyn Fudge, FNP is located in their office at time of visit.  I discussed the limitations, risks, security and privacy concerns of performing an evaluation and management service by video and the availability of in person appointments. I also discussed with the patient that there may be a patient responsible charge related to this service. The patient expressed understanding and agreed to proceed.  Subjective: PCP: Bennie Pierini, FNP  Chief Complaint  Patient presents with  . Sinusitis   Patient complains of cough, headache, ear pain/pressure and facial pain/pressure.  Onset of symptoms was 2 days ago, gradually worsening since that time. She is drinking plenty of fluids. Evaluation to date: none. Treatment to date: Ibuprofen.  She does smoke.    ROS: Per HPI  Current Outpatient Medications:  .  ALPRAZolam (XANAX) 0.5 MG tablet, Take 1 tablet (0.5 mg total) by mouth 2 (two) times daily as needed., Disp: 60 tablet, Rfl: 5 .  desonide (DESOWEN) 0.05 % cream, Apply topically 2 (two) times daily., Disp: 60 g, Rfl: 2 .  esomeprazole (NEXIUM) 40 MG capsule, Take 1 capsule (40 mg total) by mouth daily. Take 30 min before meal, Disp: 90 capsule, Rfl: 0 .  lisinopril (ZESTRIL) 20 MG tablet, Take 1 tablet (20 mg total) by mouth daily., Disp: 90 tablet, Rfl: 1 .  rosuvastatin (CRESTOR) 10 MG tablet, Take 1 tablet (10 mg total) by mouth daily., Disp: 90 tablet, Rfl: 1 .  venlafaxine XR (EFFEXOR XR) 75 MG 24 hr capsule, 1-2po qd, Disp: 180 capsule, Rfl: 1  Allergies  Allergen Reactions  . Latex   . Penicillins    Past Medical History:  Diagnosis Date  . Depression   . Eczema   . Hyperlipidemia   .  Hypertension   . Vitamin D deficiency     Observations/Objective: Physical Exam Constitutional:      General: She is not in acute distress.    Appearance: Normal appearance. She is not ill-appearing or toxic-appearing.  Eyes:     General: No scleral icterus.       Right eye: No discharge.        Left eye: No discharge.     Conjunctiva/sclera: Conjunctivae normal.  Pulmonary:     Effort: Pulmonary effort is normal. No respiratory distress.  Neurological:     Mental Status: She is alert and oriented to person, place, and time.  Psychiatric:        Mood and Affect: Mood normal.        Behavior: Behavior normal.        Thought Content: Thought content normal.        Judgment: Judgment normal.    Assessment and Plan: 1. Acute non-recurrent sinusitis, unspecified location - cefdinir (OMNICEF) 300 MG capsule; Take 1 capsule (300 mg total) by mouth 2 (two) times daily for 10 days.  Dispense: 20 capsule; Refill: 0  Follow Up Instructions:   I discussed the assessment and treatment plan with the patient. The patient was provided an opportunity to ask questions and all were answered. The patient agreed with the plan and demonstrated an understanding of the instructions.   The patient was advised to call back or seek an  in-person evaluation if the symptoms worsen or if the condition fails to improve as anticipated.  The above assessment and management plan was discussed with the patient. The patient verbalized understanding of and has agreed to the management plan. Patient is aware to call the clinic if symptoms persist or worsen. Patient is aware when to return to the clinic for a follow-up visit. Patient educated on when it is appropriate to go to the emergency department.   Time call ended: 5:52 PM  I provided 9 minutes of face-to-face time during this encounter.   Deliah Boston, MSN, APRN, FNP-C [facility] 03/22/20

## 2020-03-30 ENCOUNTER — Other Ambulatory Visit: Payer: Self-pay | Admitting: Nurse Practitioner

## 2020-03-30 DIAGNOSIS — F411 Generalized anxiety disorder: Secondary | ICD-10-CM

## 2020-05-20 ENCOUNTER — Other Ambulatory Visit: Payer: Self-pay | Admitting: Nurse Practitioner

## 2020-05-20 DIAGNOSIS — F3342 Major depressive disorder, recurrent, in full remission: Secondary | ICD-10-CM

## 2020-06-27 DIAGNOSIS — Z1231 Encounter for screening mammogram for malignant neoplasm of breast: Secondary | ICD-10-CM | POA: Diagnosis not present

## 2020-06-28 ENCOUNTER — Other Ambulatory Visit: Payer: Self-pay | Admitting: Nurse Practitioner

## 2020-06-28 DIAGNOSIS — F3342 Major depressive disorder, recurrent, in full remission: Secondary | ICD-10-CM

## 2020-06-28 DIAGNOSIS — K219 Gastro-esophageal reflux disease without esophagitis: Secondary | ICD-10-CM

## 2020-07-05 DIAGNOSIS — R928 Other abnormal and inconclusive findings on diagnostic imaging of breast: Secondary | ICD-10-CM | POA: Diagnosis not present

## 2020-07-17 DIAGNOSIS — N3946 Mixed incontinence: Secondary | ICD-10-CM | POA: Diagnosis not present

## 2020-07-17 DIAGNOSIS — N3020 Other chronic cystitis without hematuria: Secondary | ICD-10-CM | POA: Diagnosis not present

## 2020-07-31 DIAGNOSIS — Z01419 Encounter for gynecological examination (general) (routine) without abnormal findings: Secondary | ICD-10-CM | POA: Diagnosis not present

## 2020-07-31 DIAGNOSIS — Z124 Encounter for screening for malignant neoplasm of cervix: Secondary | ICD-10-CM | POA: Diagnosis not present

## 2020-07-31 DIAGNOSIS — M6289 Other specified disorders of muscle: Secondary | ICD-10-CM | POA: Diagnosis not present

## 2020-07-31 DIAGNOSIS — Z1151 Encounter for screening for human papillomavirus (HPV): Secondary | ICD-10-CM | POA: Diagnosis not present

## 2020-07-31 DIAGNOSIS — Z130 Encounter for screening for diseases of the blood and blood-forming organs and certain disorders involving the immune mechanism: Secondary | ICD-10-CM | POA: Diagnosis not present

## 2020-07-31 DIAGNOSIS — N3020 Other chronic cystitis without hematuria: Secondary | ICD-10-CM | POA: Diagnosis not present

## 2020-07-31 DIAGNOSIS — N3946 Mixed incontinence: Secondary | ICD-10-CM | POA: Diagnosis not present

## 2020-07-31 DIAGNOSIS — M6281 Muscle weakness (generalized): Secondary | ICD-10-CM | POA: Diagnosis not present

## 2020-07-31 DIAGNOSIS — Z6824 Body mass index (BMI) 24.0-24.9, adult: Secondary | ICD-10-CM | POA: Diagnosis not present

## 2020-08-06 DIAGNOSIS — S52502A Unspecified fracture of the lower end of left radius, initial encounter for closed fracture: Secondary | ICD-10-CM | POA: Diagnosis not present

## 2020-08-08 DIAGNOSIS — M25531 Pain in right wrist: Secondary | ICD-10-CM | POA: Diagnosis not present

## 2020-08-08 DIAGNOSIS — M25532 Pain in left wrist: Secondary | ICD-10-CM | POA: Diagnosis not present

## 2020-08-08 DIAGNOSIS — S52561A Barton's fracture of right radius, initial encounter for closed fracture: Secondary | ICD-10-CM | POA: Diagnosis not present

## 2020-08-09 DIAGNOSIS — S52561A Barton's fracture of right radius, initial encounter for closed fracture: Secondary | ICD-10-CM | POA: Diagnosis not present

## 2020-08-09 DIAGNOSIS — Y999 Unspecified external cause status: Secondary | ICD-10-CM | POA: Diagnosis not present

## 2020-08-09 DIAGNOSIS — S52571A Other intraarticular fracture of lower end of right radius, initial encounter for closed fracture: Secondary | ICD-10-CM | POA: Diagnosis not present

## 2020-08-09 DIAGNOSIS — X58XXXA Exposure to other specified factors, initial encounter: Secondary | ICD-10-CM | POA: Diagnosis not present

## 2020-08-25 ENCOUNTER — Ambulatory Visit: Payer: Self-pay | Admitting: Nurse Practitioner

## 2020-08-25 DIAGNOSIS — S52561D Barton's fracture of right radius, subsequent encounter for closed fracture with routine healing: Secondary | ICD-10-CM | POA: Diagnosis not present

## 2020-09-07 DIAGNOSIS — M25631 Stiffness of right wrist, not elsewhere classified: Secondary | ICD-10-CM | POA: Diagnosis not present

## 2020-09-07 DIAGNOSIS — S52561D Barton's fracture of right radius, subsequent encounter for closed fracture with routine healing: Secondary | ICD-10-CM | POA: Diagnosis not present

## 2020-09-12 ENCOUNTER — Ambulatory Visit: Payer: 59 | Admitting: Dermatology

## 2020-09-12 ENCOUNTER — Other Ambulatory Visit: Payer: Self-pay

## 2020-09-12 ENCOUNTER — Encounter: Payer: Self-pay | Admitting: Dermatology

## 2020-09-12 DIAGNOSIS — L821 Other seborrheic keratosis: Secondary | ICD-10-CM | POA: Diagnosis not present

## 2020-09-12 DIAGNOSIS — Z1283 Encounter for screening for malignant neoplasm of skin: Secondary | ICD-10-CM

## 2020-09-12 DIAGNOSIS — L720 Epidermal cyst: Secondary | ICD-10-CM

## 2020-09-12 DIAGNOSIS — I781 Nevus, non-neoplastic: Secondary | ICD-10-CM | POA: Diagnosis not present

## 2020-09-12 MED ORDER — CLOBETASOL PROPIONATE 0.05 % EX OINT
1.0000 | TOPICAL_OINTMENT | Freq: Every day | CUTANEOUS | 2 refills | Status: DC
Start: 2020-09-12 — End: 2023-08-06

## 2020-09-18 ENCOUNTER — Other Ambulatory Visit: Payer: Self-pay | Admitting: Nurse Practitioner

## 2020-09-18 ENCOUNTER — Other Ambulatory Visit: Payer: 59

## 2020-09-18 DIAGNOSIS — K219 Gastro-esophageal reflux disease without esophagitis: Secondary | ICD-10-CM

## 2020-09-18 DIAGNOSIS — F411 Generalized anxiety disorder: Secondary | ICD-10-CM

## 2020-09-18 DIAGNOSIS — I10 Essential (primary) hypertension: Secondary | ICD-10-CM

## 2020-09-18 DIAGNOSIS — Z20822 Contact with and (suspected) exposure to covid-19: Secondary | ICD-10-CM

## 2020-09-19 ENCOUNTER — Encounter: Payer: Self-pay | Admitting: Dermatology

## 2020-09-19 LAB — SARS-COV-2, NAA 2 DAY TAT

## 2020-09-19 LAB — NOVEL CORONAVIRUS, NAA: SARS-CoV-2, NAA: NOT DETECTED

## 2020-09-19 NOTE — Progress Notes (Signed)
   Follow-Up Visit   Subjective  Janet Randolph is a 60 y.o. female who presents for the following: Skin Problem (Left abdomen and nose raised spot, also refill clobetasol ointment for shin when it flares).  General skin examination Location:  Duration:  Quality:  Associated Signs/Symptoms: Modifying Factors:  Severity:  Timing: Context:   Objective  Well appearing patient in no apparent distress; mood and affect are within normal limits. Objective  Chest - Medial [facility]): Complete skin examination.  No atypical moles, melanoma, nonmole skin cancer.  Objective  Mid Back: Noninflamed 1 cm open cyst  Objective  Mid Supratip of Nose: Subtle 2 mm tan papule, single pseudocysts seen with dermoscopy.  Objective  Left Inguinal Area: Nonpalpable, easily compressible 4 mm ectasia.   A full examination was performed including scalp, head, eyes, ears, nose, lips, neck, chest, axillae, abdomen, back, buttocks, bilateral upper extremities, bilateral lower extremities, hands, feet, fingers, toes, fingernails, and toenails. All findings within normal limits unless otherwise noted below.   Assessment & Plan    Screening exam for skin cancer Chest - Medial [facility])  Annual dermatologist examination, encouraged to self examine twice yearly  Epidermal cyst Mid Back  Patient chooses to leave if stable.  Seborrheic keratosis Mid Supratip of Nose  Recheck if grows or bleeds.  Spider angioma Left Inguinal Area  Leave is stable.     I, Janalyn Harder, MD, have reviewed all documentation for this visit.  The documentation on 09/19/20 for the exam, diagnosis, procedures, and orders are all accurate and complete.

## 2020-09-22 ENCOUNTER — Ambulatory Visit (INDEPENDENT_AMBULATORY_CARE_PROVIDER_SITE_OTHER): Payer: 59 | Admitting: Nurse Practitioner

## 2020-09-22 ENCOUNTER — Other Ambulatory Visit: Payer: Self-pay

## 2020-09-22 DIAGNOSIS — J0100 Acute maxillary sinusitis, unspecified: Secondary | ICD-10-CM

## 2020-09-22 MED ORDER — DOXYCYCLINE HYCLATE 100 MG PO TABS: 100.0000 mg | ORAL_TABLET | Freq: Two times a day (BID) | ORAL | 0 refills | Status: DC

## 2020-09-22 NOTE — Progress Notes (Deleted)
   Subjective:    Patient ID: Janet Randolph, female    DOB: [birth year], 60 y.o.   MRN: [id number]   Chief Complaint: medical management of chronic issues     HPI:  1. Primary hypertension No c/o chest pain, sob or headache. Does not check blood pressure at home. BP Readings from Last 3 Encounters:  02/22/20 136/81  08/26/19 136/86  03/01/19 133/79     2. Mixed hyperlipidemia Does try to watch diet but does very little exercise. Lab Results  Component Value Date   CHOL 169 02/22/2020   HDL 55 02/22/2020   LDLCALC 86 02/22/2020   TRIG 164 (H) 02/22/2020   CHOLHDL 3.1 02/22/2020     3. Gastroesophageal reflux disease, unspecified whether esophagitis present Takes nexium as needed for reflux. Works well for her  4. GAD (generalized anxiety disorder) Takes xanax 2 x a day as needed.  5. Recurrent major depressive disorder, in full remission [facility]) Has been on effexor for several years. si dong well on it without any side effects.    Outpatient Encounter Medications as of 09/22/2020  Medication Sig  . ALPRAZolam (XANAX) 0.5 MG tablet Take 1 tablet (0.5 mg total) by mouth 2 (two) times daily as needed.  . clobetasol ointment (TEMOVATE) 0.05 % Apply 1 application topically daily. Large surface area  . desonide (DESOWEN) 0.05 % cream Apply topically 2 (two) times daily.  Marland Kitchen esomeprazole (NEXIUM) 40 MG capsule TAKE 1 CAPSULE (40 MG TOTAL) BY MOUTH DAILY. TAKE 30 MIN BEFORE MEAL  . lisinopril (ZESTRIL) 20 MG tablet TAKE 1 TABLET BY MOUTH EVERY DAY  . rosuvastatin (CRESTOR) 10 MG tablet Take 1 tablet (10 mg total) by mouth daily.  Marland Kitchen venlafaxine XR (EFFEXOR XR) 75 MG 24 hr capsule 1-2po qd   No facility-administered encounter medications on file as of 09/22/2020.    Past Surgical History:  Procedure Laterality Date  . HAND SURGERY      Family History  Problem Relation Age of Onset  . Asthma Father   . Crohn's disease Father   . Melanoma Father     New  complaints: ***  Social history: ***  Controlled substance contract: ***    Review of Systems     Objective:   Physical Exam        Assessment & Plan:

## 2020-09-22 NOTE — Progress Notes (Signed)
Virtual Visit via telephone Note Due to COVID-19 pandemic this visit was conducted virtually. This visit type was conducted due to national recommendations for restrictions regarding the COVID-19 Pandemic (e.g. social distancing, sheltering in place) in an effort to limit this patient's exposure and mitigate transmission in our community. All issues noted in this document were discussed and addressed.  A physical exam was not performed with this format.  I connected with Janet Randolph on 09/22/20 at 12:20 by telephone and verified that I am speaking with the correct person using two identifiers. Janet Randolph is currently located at home and noone is currently with her during visit. The provider, Mary-Margaret Daphine Deutscher, FNP is located in their office at time of visit.  I discussed the limitations, risks, security and privacy concerns of performing an evaluation and management service by telephone and the availability of in person appointments. I also discussed with the patient that there may be a patient responsible charge related to this service. The patient expressed understanding and agreed to proceed.   History and Present Illness: Chief Complaint: URI   HPI Patient changed from face to face visit to telephone visit because she is sick. She has cough , congestion and facial pressure. Slight sore thorat. She came and was tested for covid on Monday and was negative.     Review of Systems  Constitutional: Negative for chills and fever.  HENT: Positive for congestion, postnasal drip, rhinorrhea and sinus pressure. Negative for ear pain.   Respiratory: Positive for cough (slight non productive). Negative for shortness of breath and wheezing.   Musculoskeletal: Negative for myalgias.  Neurological: Positive for headaches (slight). Negative for dizziness.  All other systems reviewed and are negative.      Observations/Objective: Alert and oriented- answers all questions  appropriately No distress Voice hoarse No cough noted   Assessment and Plan: Janet Randolph in today with chief complaint of URI   1. Acute maxillary sinusitis, recurrence not specified 1. Take meds as prescribed 2. Use a cool mist humidifier especially during the winter months and when heat has been humid. 3. Use saline nose sprays frequently 4. Saline irrigations of the nose can be very helpful if done frequently.  * 4X daily for 1 week*  * Use of a nettie pot can be helpful with this. Follow directions with this* 5. Drink plenty of fluids 6. Keep thermostat turn down low 7.For any cough or congestion  Use plain Mucinex- regular strength or max strength is fine   * Children- consult with Pharmacist for dosing 8. For fever or aces or pains- take tylenol or ibuprofen appropriate for age and weight.  * for fevers greater than 101 orally you may alternate ibuprofen and tylenol every  3 hours.   Meds ordered this encounter  Medications  . doxycycline (VIBRA-TABS) 100 MG tablet    Sig: Take 1 tablet (100 mg total) by mouth 2 (two) times daily. 1 po bid    Dispense:  20 tablet    Refill:  0    Order Specific Question:   Supervising Provider    Answer:   Arville Care A [[id number]]       Follow Up Instructions: 1 week for chronic follow up    I discussed the assessment and treatment plan with the patient. The patient was provided an opportunity to ask questions and all were answered. The patient agreed with the plan and demonstrated an understanding of the instructions.   The patient was  advised to call back or seek an in-person evaluation if the symptoms worsen or if the condition fails to improve as anticipated.  The above assessment and management plan was discussed with the patient. The patient verbalized understanding of and has agreed to the management plan. Patient is aware to call the clinic if symptoms persist or worsen. Patient is aware when to return to the  clinic for a follow-up visit. Patient educated on when it is appropriate to go to the emergency department.   Time call ended:  12:23  I provided 13 minutes of non-face-to-face time during this encounter.    Mary-Margaret Daphine Deutscher, FNP

## 2020-09-26 ENCOUNTER — Ambulatory Visit (INDEPENDENT_AMBULATORY_CARE_PROVIDER_SITE_OTHER): Payer: 59 | Admitting: Nurse Practitioner

## 2020-09-26 ENCOUNTER — Encounter: Payer: Self-pay | Admitting: Nurse Practitioner

## 2020-09-26 DIAGNOSIS — F3342 Major depressive disorder, recurrent, in full remission: Secondary | ICD-10-CM

## 2020-09-26 DIAGNOSIS — E782 Mixed hyperlipidemia: Secondary | ICD-10-CM | POA: Diagnosis not present

## 2020-09-26 DIAGNOSIS — I10 Essential (primary) hypertension: Secondary | ICD-10-CM | POA: Diagnosis not present

## 2020-09-26 DIAGNOSIS — F411 Generalized anxiety disorder: Secondary | ICD-10-CM

## 2020-09-26 DIAGNOSIS — K219 Gastro-esophageal reflux disease without esophagitis: Secondary | ICD-10-CM | POA: Diagnosis not present

## 2020-09-26 MED ORDER — ROSUVASTATIN CALCIUM 10 MG PO TABS: 10.0000 mg | ORAL_TABLET | Freq: Every day | ORAL | 1 refills | Status: DC

## 2020-09-26 MED ORDER — ESOMEPRAZOLE MAGNESIUM 40 MG PO CPDR: 40.0000 mg | DELAYED_RELEASE_CAPSULE | Freq: Every day | ORAL | 1 refills | Status: DC

## 2020-09-26 MED ORDER — LISINOPRIL 20 MG PO TABS
20.0000 mg | ORAL_TABLET | Freq: Every day | ORAL | 1 refills | Status: DC
Start: 2020-09-26 — End: 2021-03-26

## 2020-09-26 MED ORDER — ALPRAZOLAM 0.5 MG PO TABS: 0.5000 mg | ORAL_TABLET | Freq: Two times a day (BID) | ORAL | 5 refills | Status: DC | PRN

## 2020-09-26 MED ORDER — VENLAFAXINE HCL ER 75 MG PO CP24: ORAL_CAPSULE | ORAL | 1 refills | Status: DC

## 2020-09-26 NOTE — Progress Notes (Signed)
Virtual Visit via telephone Note Due to COVID-19 pandemic this visit was conducted virtually. This visit type was conducted due to national recommendations for restrictions regarding the COVID-19 Pandemic (e.g. social distancing, sheltering in place) in an effort to limit this patient's exposure and mitigate transmission in our community. All issues noted in this document were discussed and addressed.  A physical exam was not performed with this format.  I connected with [name] on 09/26/20 at 11:05 by telephone and verified that I am speaking with the correct person using two identifiers. Janet Randolph is currently located at home and no one is currently with  her during visit. The provider, Mary-Margaret Hassell Done, FNP is located in their office at time of visit.  I discussed the limitations, risks, security and privacy concerns of performing an evaluation and management service by telephone and the availability of in person appointments. I also discussed with the patient that there may be a patient responsible charge related to this service. The patient expressed understanding and agreed to proceed.   History and Present Illness:   Chief Complaint: Medical Management of Chronic Issues    HPI:  1. Primary hypertension No c/o chest pain, sob or headache. Does not check blood pressure at home. BP Readings from Last 3 Encounters:  02/22/20 136/81  08/26/19 136/86  03/01/19 133/79     2. Mixed hyperlipidemia Does try to watch diet but is doing no exercise. Lab Results  Component Value Date   CHOL 169 02/22/2020   HDL 55 02/22/2020   LDLCALC 86 02/22/2020   TRIG 164 (H) 02/22/2020   CHOLHDL 3.1 02/22/2020     3. Gastroesophageal reflux disease, unspecified whether esophagitis present Is on nexium and is working to keep symptoms under control.  4. Recurrent major depressive disorder, in full remission [facility]) She is on effexor and is doing ok. Some days she has to  take an extra pill. She thinks it is being stuck at home and not able to get out and go to work. Depression screen [facility] 2/9 09/26/2020 02/22/2020 08/26/2019  Decreased Interest 0 0 0  Down, Depressed, Hopeless 1 0 0  PHQ - 2 Score 1 0 0  Altered sleeping - - -  Tired, decreased energy - - -  Change in appetite - - -  Feeling bad or failure about yourself  - - -  Trouble concentrating - - -  Moving slowly or fidgety/restless - - -  Suicidal thoughts - - -  PHQ-9 Score - - -      5. GAD (generalized anxiety disorder) She is on xanax and usually only takes 1 x a day. Some days she has to take 2 tablets.  GAD 7 : Generalized Anxiety Score 09/26/2020 02/22/2020 08/26/2019 06/23/2017  Nervous, Anxious, on Edge 0 3 2 1   Control/stop worrying 0 0 0 1  Worry too much - different things 0 0 0 1  Trouble relaxing 0 0 0 0  Restless 0 0 0 1  Easily annoyed or irritable 1 1 0 0  Afraid - awful might happen 0 0 0 0  Total GAD 7 Score 1 4 2 4   Anxiety Difficulty Not difficult at all - Not difficult at all Somewhat difficult      Outpatient Encounter Medications as of 09/26/2020  Medication Sig  . ALPRAZolam (XANAX) 0.5 MG tablet Take 1 tablet (0.5 mg total) by mouth 2 (two) times daily as needed.  . clobetasol ointment (TEMOVATE) 0.05 %  Apply 1 application topically daily. Large surface area  . desonide (DESOWEN) 0.05 % cream Apply topically 2 (two) times daily.  Marland Kitchen doxycycline (VIBRA-TABS) 100 MG tablet Take 1 tablet (100 mg total) by mouth 2 (two) times daily. 1 po bid  . esomeprazole (NEXIUM) 40 MG capsule TAKE 1 CAPSULE (40 MG TOTAL) BY MOUTH DAILY. TAKE 30 MIN BEFORE MEAL  . lisinopril (ZESTRIL) 20 MG tablet TAKE 1 TABLET BY MOUTH EVERY DAY  . rosuvastatin (CRESTOR) 10 MG tablet Take 1 tablet (10 mg total) by mouth daily.  Marland Kitchen venlafaxine XR (EFFEXOR XR) 75 MG 24 hr capsule 1-2po qd    Past Surgical History:  Procedure Laterality Date  . HAND SURGERY      Family History  Problem  Relation Age of Onset  . Asthma Father   . Crohn's disease Father   . Melanoma Father     New complaints: None today  Social history: Lives by herself an dis  Currently working from home.  Controlled substance contract: n/a    Review of Systems  Constitutional: Negative for diaphoresis and weight loss.  Eyes: Negative for blurred vision, double vision and pain.  Respiratory: Negative for shortness of breath.   Cardiovascular: Negative for chest pain, palpitations, orthopnea and leg swelling.  Gastrointestinal: Negative for abdominal pain.  Skin: Negative for rash.  Neurological: Negative for dizziness, sensory change, loss of consciousness, weakness and headaches.  Endo/Heme/Allergies: Negative for polydipsia. Does not bruise/bleed easily.  Psychiatric/Behavioral: Negative for memory loss. The patient does not have insomnia.   All other systems reviewed and are negative.    Observations/Objective: Alert and oriented- answers all questions appropriately No distress    Assessment and Plan: Janet Randolph comes in today with chief complaint of Medical Management of Chronic Issues   Diagnosis and orders addressed:  1. Primary hypertension Low sodium diet - CBC with Differential/Platelet; Future - CMP14+EGFR; Future - lisinopril (ZESTRIL) 20 MG tablet; Take 1 tablet (20 mg total) by mouth daily.  Dispense: 90 tablet; Refill:   2. Mixed hyperlipidemia Low fat diet - Lipid panel; Future - rosuvastatin (CRESTOR) 10 MG tablet; Take 1 tablet (10 mg total) by mouth daily.  Dispense: 90 tablet; Refill: 1  3.  Gastroesophageal reflux disease Avoid spicy foods Do not eat 2 hours prior to bedtime - esomeprazole (NEXIUM) 40 MG capsule; Take 1 capsule (40 mg total) by mouth daily. Take 30 min before meal  Dispense: 90 capsule; Refill: 1   4. Recurrent major depressive disorder, in full remission (HCC) Stress management - venlafaxine XR (EFFEXOR XR) 75 MG 24 hr capsule;  1-2po qd  Dispense: 180 capsule; Refill: 1  5. GAD (generalized anxiety disorder) - ALPRAZolam (XANAX) 0.5 MG tablet; Take 1 tablet (0.5 mg total) by mouth 2 (two) times daily as needed.  Dispense: 60 tablet; Refill: 5  Labs pending Health Maintenance reviewed Diet and exercise encouraged  Follow up plan: 6 months     I discussed the assessment and treatment plan with the patient. The patient was provided an opportunity to ask questions and all were answered. The patient agreed with the plan and demonstrated an understanding of the instructions.   The patient was advised to call back or seek an in-person evaluation if the symptoms worsen or if the condition fails to improve as anticipated.  The above assessment and management plan was discussed with the patient. The patient verbalized understanding of and has agreed to the management plan. Patient is aware to call  the clinic if symptoms persist or worsen. Patient is aware when to return to the clinic for a follow-up visit. Patient educated on when it is appropriate to go to the emergency department.   Time call ended:  11:21  I provided 16 minutes of non-face-to-face time during this encounter.    Mary-Margaret Hassell Done, FNP

## 2021-02-11 ENCOUNTER — Other Ambulatory Visit: Payer: Self-pay | Admitting: Nurse Practitioner

## 2021-02-11 DIAGNOSIS — F3342 Major depressive disorder, recurrent, in full remission: Secondary | ICD-10-CM

## 2021-03-26 ENCOUNTER — Encounter: Payer: Self-pay | Admitting: Nurse Practitioner

## 2021-03-26 ENCOUNTER — Other Ambulatory Visit: Payer: Self-pay

## 2021-03-26 ENCOUNTER — Ambulatory Visit: Payer: 59 | Admitting: Nurse Practitioner

## 2021-03-26 VITALS — BP 134/82 | HR 72 | Temp 98.6°F | Resp 20 | Ht 65.0 in | Wt 149.0 lb

## 2021-03-26 DIAGNOSIS — F3342 Major depressive disorder, recurrent, in full remission: Secondary | ICD-10-CM

## 2021-03-26 DIAGNOSIS — K219 Gastro-esophageal reflux disease without esophagitis: Secondary | ICD-10-CM

## 2021-03-26 DIAGNOSIS — E782 Mixed hyperlipidemia: Secondary | ICD-10-CM | POA: Diagnosis not present

## 2021-03-26 DIAGNOSIS — F411 Generalized anxiety disorder: Secondary | ICD-10-CM

## 2021-03-26 DIAGNOSIS — I10 Essential (primary) hypertension: Secondary | ICD-10-CM

## 2021-03-26 MED ORDER — LISINOPRIL 20 MG PO TABS
20.0000 mg | ORAL_TABLET | Freq: Every day | ORAL | 1 refills | Status: DC
Start: 2021-03-26 — End: 2021-09-28

## 2021-03-26 MED ORDER — VENLAFAXINE HCL ER 75 MG PO CP24
ORAL_CAPSULE | ORAL | 0 refills | Status: DC
Start: 2021-03-26 — End: 2021-07-26

## 2021-03-26 MED ORDER — ESOMEPRAZOLE MAGNESIUM 40 MG PO CPDR: 40.0000 mg | DELAYED_RELEASE_CAPSULE | Freq: Every day | ORAL | 1 refills | Status: DC

## 2021-03-26 MED ORDER — ALPRAZOLAM 0.5 MG PO TABS: 0.5000 mg | ORAL_TABLET | Freq: Two times a day (BID) | ORAL | 5 refills | Status: DC | PRN

## 2021-03-26 MED ORDER — ROSUVASTATIN CALCIUM 10 MG PO TABS
10.0000 mg | ORAL_TABLET | Freq: Every day | ORAL | 1 refills | Status: DC
Start: 2021-03-26 — End: 2021-09-28

## 2021-03-26 NOTE — Patient Instructions (Signed)
Diarrhea, Adult Diarrhea is when you pass loose and watery poop (stool) often. Diarrhea can make you feel weak and cause you to lose water in your body (get dehydrated). Losing water in your body can cause you to: Feel tired and thirsty. Have a dry mouth. Go pee (urinate) less often. Diarrhea often lasts 2-3 days. However, it can last longer if it is a sign of something more serious. It is important to treat your diarrhea as told by your doctor. Follow these instructions at home: Eating and drinking   Follow these instructions as told by your doctor: Take an ORS (oral rehydration solution). This is a drink that helps you replace fluids and minerals your body lost. It is sold at pharmacies and stores. Drink plenty of fluids, such as: Water. Ice chips. Diluted fruit juice. Low-calorie sports drinks. Milk, if you want. Avoid drinking fluids that have a lot of sugar or caffeine in them. Eat bland, easy-to-digest foods in small amounts as you are able. These foods include: Bananas. Applesauce. Rice. Low-fat (lean) meats. Toast. Crackers. Avoid alcohol. Avoid spicy or fatty foods.  Medicines Take over-the-counter and prescription medicines only as told by your doctor. If you were prescribed an antibiotic medicine, take it as told by your doctor. Do not stop using the antibiotic even if you start to feel better. General instructions  Wash your hands often using soap and water. If soap and water are not available, use a hand sanitizer. Others in your home should wash their hands as well. Hands should be washed: After using the toilet or changing a diaper. Before preparing, cooking, or serving food. While caring for a sick person. While visiting someone in a hospital. Drink enough fluid to keep your pee (urine) pale yellow. Rest at home while you get better. Watch your condition for any changes. Take a warm bath to help with any burning or pain from having diarrhea. Keep all  follow-up visits as told by your doctor. This is important. Contact a doctor if: You have a fever. Your diarrhea gets worse. You have new symptoms. You cannot keep fluids down. You feel light-headed or dizzy. You have a headache. You have muscle cramps. Get help right away if: You have chest pain. You feel very weak or you pass out (faint). You have bloody or black poop or poop that looks like tar. You have very bad pain, cramping, or bloating in your belly (abdomen). You have trouble breathing or you are breathing very quickly. Your heart is beating very quickly. Your skin feels cold and clammy. You feel confused. You have signs of losing too much water in your body, such as: Dark pee, very little pee, or no pee. Cracked lips. Dry mouth. Sunken eyes. Sleepiness. Weakness. Summary Diarrhea is when you pass loose and watery poop (stool) often. Diarrhea can make you feel weak and cause you to lose water in your body (get dehydrated). Take an ORS (oral rehydration solution). This is a drink that is sold at pharmacies and stores. Eat bland, easy-to-digest foods in small amounts as you are able. Contact a doctor if your condition gets worse. Get help right away if you have signs that you have lost too much water in your body. This information is not intended to replace advice given to you by your health care provider. Make sure you discuss any questions you have with your health care provider. Document Revised: 01/30/2018 Document Reviewed: 01/30/2018 Elsevier Patient Education  2022 [employer]  

## 2021-03-26 NOTE — Progress Notes (Signed)
Subjective:    Patient ID: Janet Randolph, female    DOB: 01-02-1961, 60 y.o.   MRN: [id number]   Chief Complaint: Medical Management of Chronic Issues    HPI:  1. Primary hypertension No c/o chest pain, sob or headache. Does not check blood pressure at home. BP Readings from Last 3 Encounters:  02/22/20 136/81  08/26/19 136/86  03/01/19 133/79     2. Mixed hyperlipidemia Does not really watch diet and does little to no exercise. Lab Results  Component Value Date   CHOL 169 02/22/2020   HDL 55 02/22/2020   LDLCALC 86 02/22/2020   TRIG 164 (H) 02/22/2020   CHOLHDL 3.1 02/22/2020     3. Gastroesophageal reflux disease, unspecified whether esophagitis present Is on nexium daily and is doing well.    4. Recurrent major depressive disorder, in full remission ([place]) Is on effexor XR and is doing well. No medication side effects. Depression screen [facility] 2/9 03/26/2021 09/26/2020 02/22/2020  Decreased Interest 0 0 0  Down, Depressed, Hopeless 1 1 0  PHQ - 2 Score 1 1 0  Altered sleeping 0 - -  Tired, decreased energy 1 - -  Change in appetite 0 - -  Feeling bad or failure about yourself  0 - -  Trouble concentrating 1 - -  Moving slowly or fidgety/restless 0 - -  Suicidal thoughts 0 - -  PHQ-9 Score 3 - -  Difficult doing work/chores Not difficult at all - -     5. GAD (generalized anxiety disorder) Is on xanax daily. Has been taking for many years.    Outpatient Encounter Medications as of 03/26/2021  Medication Sig   ALPRAZolam (XANAX) 0.5 MG tablet Take 1 tablet (0.5 mg total) by mouth 2 (two) times daily as needed.   clobetasol ointment (TEMOVATE) 7.61 % Apply 1 application topically daily. Large surface area   desonide (DESOWEN) 0.05 % cream Apply topically 2 (two) times daily.   esomeprazole (NEXIUM) 40 MG capsule Take 1 capsule (40 mg total) by mouth daily. Take 30 min before meal   lisinopril (ZESTRIL) 20 MG tablet Take 1 tablet (20 mg total) by mouth  daily.   rosuvastatin (CRESTOR) 10 MG tablet Take 1 tablet (10 mg total) by mouth daily.   venlafaxine XR (EFFEXOR-XR) 75 MG 24 hr capsule TAKE 1 OR 2 CAPSULES BY MOUTH ONCE DAILY   No facility-administered encounter medications on file as of 03/26/2021.    Past Surgical History:  Procedure Laterality Date   HAND SURGERY      Family History  Problem Relation Age of Onset   Asthma Father    Crohn's disease Father    Melanoma Father     New complaints: Has had diarrhea daily. When she has to go she has to go. She takes kaopectate which helps some.  Social history: Lives by herself  Controlled substance contract: n/a     Review of Systems  Constitutional:  Negative for diaphoresis.  Eyes:  Negative for pain.  Respiratory:  Negative for shortness of breath.   Cardiovascular:  Negative for chest pain, palpitations and leg swelling.  Gastrointestinal:  Negative for abdominal pain.  Endocrine: Negative for polydipsia.  Skin:  Negative for rash.  Neurological:  Negative for dizziness, weakness and headaches.  Hematological:  Does not bruise/bleed easily.  All other systems reviewed and are negative.     Objective:   Physical Exam Vitals and nursing note reviewed.  Constitutional:  General: She is not in acute distress.    Appearance: Normal appearance. She is well-developed.  HENT:     Head: Normocephalic.     Right Ear: Tympanic membrane normal.     Left Ear: Tympanic membrane normal.     Nose: Nose normal.     Mouth/Throat:     Mouth: Mucous membranes are moist.  Eyes:     Pupils: Pupils are equal, round, and reactive to light.  Neck:     Vascular: No carotid bruit or JVD.  Cardiovascular:     Rate and Rhythm: Normal rate and regular rhythm.     Heart sounds: Normal heart sounds.  Pulmonary:     Effort: Pulmonary effort is normal. No respiratory distress.     Breath sounds: Normal breath sounds. No wheezing or rales.  Chest:     Chest wall: No  tenderness.  Abdominal:     General: Bowel sounds are normal. There is no distension or abdominal bruit.     Palpations: Abdomen is soft. There is no hepatomegaly, splenomegaly, mass or pulsatile mass.     Tenderness: There is no abdominal tenderness.  Musculoskeletal:        General: Normal range of motion.     Cervical back: Normal range of motion and neck supple.  Lymphadenopathy:     Cervical: No cervical adenopathy.  Skin:    General: Skin is warm and dry.  Neurological:     Mental Status: She is alert and oriented to person, place, and time.     Deep Tendon Reflexes: Reflexes are normal and symmetric.  Psychiatric:        Behavior: Behavior normal.        Thought Content: Thought content normal.        Judgment: Judgment normal.    BP 134/82   Pulse 72   Temp 98.6 F (37 C) (Temporal)   Resp 20   Ht 5' 5"  (1.651 m)   Wt 149 lb (67.6 kg)   SpO2 97%   BMI 24.79 kg/m         Assessment & Plan:  Janet Randolph comes in today with chief complaint of Medical Management of Chronic Issues (Problems with frequent bowel movements)   Diagnosis and orders addressed:  1. Primary hypertension ;oe sodium diet - lisinopril (ZESTRIL) 20 MG tablet; Take 1 tablet (20 mg total) by mouth daily.  Dispense: 90 tablet; Refill: 1 - CBC with Differential/Platelet - CMP14+EGFR  2. Mixed hyperlipidemia Low fat diet - rosuvastatin (CRESTOR) 10 MG tablet; Take 1 tablet (10 mg total) by mouth daily.  Dispense: 90 tablet; Refill: 1 - Lipid panel  3. Gastroesophageal reflux disease, unspecified whether esophagitis present Avoid spicy foods Do not eat 2 hours prior to bedtime - esomeprazole (NEXIUM) 40 MG capsule; Take 1 capsule (40 mg total) by mouth daily. Take 30 min before meal  Dispense: 90 capsule; Refill: 1  4. Recurrent major depressive disorder, in full remission ([facility]) Stress management - venlafaxine XR (EFFEXOR-XR) 75 MG 24 hr capsule; TAKE 1 OR 2 CAPSULES BY MOUTH ONCE  DAILY  Dispense: 180 capsule; Refill: 0  5. GAD (generalized anxiety disorder) - ALPRAZolam (XANAX) 0.5 MG tablet; Take 1 tablet (0.5 mg total) by mouth 2 (two) times daily as needed.  Dispense: 60 tablet; Refill: 5   Labs pending Health Maintenance reviewed Diet and exercise encouraged  Follow up plan: 6 months   Mary-Margaret Hassell Done, FNP

## 2021-03-27 LAB — CBC WITH DIFFERENTIAL/PLATELET
Basophils Absolute: 0 10*3/uL (ref 0.0–0.2)
Basos: 0 %
EOS (ABSOLUTE): 0.2 10*3/uL (ref 0.0–0.4)
Eos: 2 %
Hematocrit: 37.3 % (ref 34.0–46.6)
Hemoglobin: 12.4 g/dL (ref 11.1–15.9)
Immature Grans (Abs): 0 10*3/uL (ref 0.0–0.1)
Immature Granulocytes: 0 %
Lymphocytes Absolute: 2.1 10*3/uL (ref 0.7–3.1)
Lymphs: 29 %
MCH: 32.4 pg (ref 26.6–33.0)
MCHC: 33.2 g/dL (ref 31.5–35.7)
MCV: 97 fL (ref 79–97)
Monocytes Absolute: 0.5 10*3/uL (ref 0.1–0.9)
Monocytes: 7 %
Neutrophils Absolute: 4.3 10*3/uL (ref 1.4–7.0)
Neutrophils: 62 %
Platelets: 369 10*3/uL (ref 150–450)
RBC: 3.83 x10E6/uL (ref 3.77–5.28)
RDW: 13 % (ref 11.7–15.4)
WBC: 7.1 10*3/uL (ref 3.4–10.8)

## 2021-03-27 LAB — CMP14+EGFR
ALT: 7 IU/L (ref 0–32)
AST: 13 IU/L (ref 0–40)
Albumin/Globulin Ratio: 2.1 (ref 1.2–2.2)
Albumin: 4.2 g/dL (ref 3.8–4.8)
Alkaline Phosphatase: 70 IU/L (ref 44–121)
BUN/Creatinine Ratio: 27 (ref 12–28)
BUN: 17 mg/dL (ref 8–27)
Bilirubin Total: <0.2 mg/dL (ref 0.0–1.2)
CO2: 23 mmol/L (ref 20–29)
Calcium: 8.4 mg/dL — ABNORMAL LOW (ref 8.7–10.3)
Chloride: 107 mmol/L — ABNORMAL HIGH (ref 96–106)
Creatinine, Ser: 0.63 mg/dL (ref 0.57–1.00)
Globulin, Total: 2 g/dL (ref 1.5–4.5)
Glucose: 90 mg/dL (ref 65–99)
Potassium: 4 mmol/L (ref 3.5–5.2)
Sodium: 143 mmol/L (ref 134–144)
Total Protein: 6.2 g/dL (ref 6.0–8.5)
eGFR: 101 mL/min/{1.73_m2} (ref >59)

## 2021-03-27 LAB — LIPID PANEL
Chol/HDL Ratio: 3.3 ratio (ref 0.0–4.4)
Cholesterol, Total: 165 mg/dL (ref 100–199)
HDL: 50 mg/dL (ref >39)
LDL Chol Calc (NIH): 82 mg/dL (ref 0–99)
Triglycerides: 196 mg/dL — ABNORMAL HIGH (ref 0–149)
VLDL Cholesterol Cal: 33 mg/dL (ref 5–40)

## 2021-05-20 ENCOUNTER — Other Ambulatory Visit: Payer: Self-pay | Admitting: Nurse Practitioner

## 2021-05-20 DIAGNOSIS — K219 Gastro-esophageal reflux disease without esophagitis: Secondary | ICD-10-CM

## 2021-07-23 ENCOUNTER — Other Ambulatory Visit: Payer: Self-pay | Admitting: Family Medicine

## 2021-07-23 DIAGNOSIS — R011 Cardiac murmur, unspecified: Secondary | ICD-10-CM

## 2021-07-23 DIAGNOSIS — Z9189 Other specified personal risk factors, not elsewhere classified: Secondary | ICD-10-CM

## 2021-07-26 ENCOUNTER — Other Ambulatory Visit: Payer: Self-pay | Admitting: Nurse Practitioner

## 2021-07-26 DIAGNOSIS — F3342 Major depressive disorder, recurrent, in full remission: Secondary | ICD-10-CM

## 2021-08-06 ENCOUNTER — Other Ambulatory Visit: Payer: Self-pay | Admitting: Family Medicine

## 2021-08-06 DIAGNOSIS — Z122 Encounter for screening for malignant neoplasm of respiratory organs: Secondary | ICD-10-CM

## 2021-08-23 ENCOUNTER — Other Ambulatory Visit: Payer: 59

## 2021-08-27 ENCOUNTER — Ambulatory Visit: Payer: 59

## 2021-08-30 ENCOUNTER — Inpatient Hospital Stay: Admission: RE | Admit: 2021-08-30 | Payer: 59 | Source: Ambulatory Visit

## 2021-09-06 ENCOUNTER — Ambulatory Visit
Admission: RE | Admit: 2021-09-06 | Discharge: 2021-09-06 | Disposition: A | Discharge status: Home / Self Care | Payer: 59 | Source: Ambulatory Visit | Attending: Family Medicine | Admitting: Family Medicine

## 2021-09-06 DIAGNOSIS — Z9189 Other specified personal risk factors, not elsewhere classified: Secondary | ICD-10-CM

## 2021-09-06 DIAGNOSIS — Z122 Encounter for screening for malignant neoplasm of respiratory organs: Secondary | ICD-10-CM

## 2021-09-27 ENCOUNTER — Ambulatory Visit: Payer: Self-pay | Admitting: Nurse Practitioner

## 2021-09-28 ENCOUNTER — Encounter: Payer: Self-pay | Admitting: Nurse Practitioner

## 2021-09-28 ENCOUNTER — Ambulatory Visit: Payer: 59 | Admitting: Nurse Practitioner

## 2021-09-28 ENCOUNTER — Telehealth: Payer: Self-pay | Admitting: Nurse Practitioner

## 2021-09-28 VITALS — BP 130/79 | HR 69 | Temp 97.8°F | Resp 20 | Ht 65.0 in | Wt 143.0 lb

## 2021-09-28 DIAGNOSIS — E782 Mixed hyperlipidemia: Secondary | ICD-10-CM | POA: Diagnosis not present

## 2021-09-28 DIAGNOSIS — K219 Gastro-esophageal reflux disease without esophagitis: Secondary | ICD-10-CM | POA: Diagnosis not present

## 2021-09-28 DIAGNOSIS — Z23 Encounter for immunization: Secondary | ICD-10-CM | POA: Diagnosis not present

## 2021-09-28 DIAGNOSIS — F3342 Major depressive disorder, recurrent, in full remission: Secondary | ICD-10-CM

## 2021-09-28 DIAGNOSIS — I10 Essential (primary) hypertension: Secondary | ICD-10-CM

## 2021-09-28 DIAGNOSIS — F411 Generalized anxiety disorder: Secondary | ICD-10-CM

## 2021-09-28 MED ORDER — ALPRAZOLAM 0.5 MG PO TABS: 0.5000 mg | ORAL_TABLET | Freq: Two times a day (BID) | ORAL | 5 refills | Status: DC | PRN

## 2021-09-28 MED ORDER — ROSUVASTATIN CALCIUM 10 MG PO TABS: 10.0000 mg | ORAL_TABLET | Freq: Every day | ORAL | 1 refills | Status: DC

## 2021-09-28 MED ORDER — VENLAFAXINE HCL ER 75 MG PO CP24: ORAL_CAPSULE | ORAL | 1 refills | Status: DC

## 2021-09-28 MED ORDER — ESOMEPRAZOLE MAGNESIUM 40 MG PO CPDR: 40.0000 mg | DELAYED_RELEASE_CAPSULE | Freq: Every day | ORAL | 1 refills | Status: DC

## 2021-09-28 MED ORDER — LISINOPRIL 20 MG PO TABS: 20.0000 mg | ORAL_TABLET | Freq: Every day | ORAL | 1 refills | Status: DC

## 2021-09-28 NOTE — Telephone Encounter (Signed)
Was ordered by Dr.errill- he will contact er and let her kniw what she needs tio do. If she does not hear anything, let me know.

## 2021-09-28 NOTE — Patient Instructions (Signed)

## 2021-09-28 NOTE — Telephone Encounter (Signed)
Spoke with patient and she said that Dr Margot Ables is her Dr that she sees at work. Unsure if they will prescribe the med or if PCP is supposed to. She will find out and let us know

## 2021-09-28 NOTE — Progress Notes (Signed)
Subjective:    Patient ID: Janet Randolph, female    DOB: October 28, 1960, 61 y.o.   MRN: [id number]  Chief Complaint: medical management of chronic issues     HPI:  Janet Randolph is a 61 y.o. who identifies as a female who was assigned female at birth.   Social history: Lives with: married    Comes in today for follow up of the following chronic medical issues:  1. Primary hypertension No c/o chest pain, sob or headache.does not check blood pressure at home. BP Readings from Last 3 Encounters:  03/26/21 134/82  02/22/20 136/81  08/26/19 136/86     2. Mixed hyperlipidemia Does not really watch diet very closely and does no dedicated exercise. Lab Results  Component Value Date   CHOL 165 03/26/2021   HDL 50 03/26/2021   LDLCALC 82 03/26/2021   TRIG 196 (H) 03/26/2021   CHOLHDL 3.3 03/26/2021   The 10-year ASCVD risk score (Arnett DK, et al., 2019) is: 10.2%   3. Gastroesophageal reflux disease, unspecified whether esophagitis present Is on nexium daily and is doing well.  4. Recurrent major depressive disorder, in full remission ([place]) Is on effexor daily and is doing well. She is having a hard time but does not want to change her meds. Depression screen [facility] 2/9 09/28/2021 03/26/2021 09/26/2020  Decreased Interest 2 0 0  Down, Depressed, Hopeless 2 1 1   PHQ - 2 Score 4 1 1   Altered sleeping 0 0 -  Tired, decreased energy 3 1 -  Change in appetite 2 0 -  Feeling bad or failure about yourself  2 0 -  Trouble concentrating 1 1 -  Moving slowly or fidgety/restless 0 0 -  Suicidal thoughts 0 0 -  PHQ-9 Score 12 3 -  Difficult doing work/chores Not difficult at all Not difficult at all -  Some recent data might be hidden     5. GAD (generalized anxiety disorder) Is on xanax bid. Does not always take it 2x a day GAD 7 : Generalized Anxiety Score 09/28/2021 03/26/2021 09/26/2020 02/22/2020  Nervous, Anxious, on Edge 3 1 0 3  Control/stop worrying 1 1 0 0  Worry  too much - different things 1 1 0 0  Trouble relaxing 0 1 0 0  Restless 0 0 0 0  Easily annoyed or irritable 2 0 1 1  Afraid - awful might happen 1 1 0 0  Total GAD 7 Score 8 5 1 4   Anxiety Difficulty Somewhat difficult Not difficult at all Not difficult at all -       New complaints: None today  Allergies  Allergen Reactions   Latex    Penicillins    Outpatient Encounter Medications as of 09/28/2021  Medication Sig   ALPRAZolam (XANAX) 0.5 MG tablet Take 1 tablet (0.5 mg total) by mouth 2 (two) times daily as needed.   clobetasol ointment (TEMOVATE) [id number] % Apply 1 application topically daily. Large surface area   desonide (DESOWEN) 0.05 % cream Apply topically 2 (two) times daily.   esomeprazole (NEXIUM) 40 MG capsule TAKE 1 CAPSULE (40 MG TOTAL) BY MOUTH DAILY. TAKE 30 MIN BEFORE MEAL   lisinopril (ZESTRIL) 20 MG tablet Take 1 tablet (20 mg total) by mouth daily.   rosuvastatin (CRESTOR) 10 MG tablet Take 1 tablet (10 mg total) by mouth daily.   venlafaxine XR (EFFEXOR-XR) 75 MG 24 hr capsule TABLET 1 OR 2 CAPSULES DAILY AS DIRECTED   No facility-administered  encounter medications on file as of 09/28/2021.    Past Surgical History:  Procedure Laterality Date   HAND SURGERY      Family History  Problem Relation Age of Onset   Asthma Father    Crohn's disease Father    Melanoma Father       Controlled substance contract: n/a     Review of Systems  Constitutional:  Negative for diaphoresis.  Eyes:  Negative for pain.  Respiratory:  Negative for shortness of breath.   Cardiovascular:  Negative for chest pain, palpitations and leg swelling.  Gastrointestinal:  Negative for abdominal pain.  Endocrine: Negative for polydipsia.  Skin:  Negative for rash.  Neurological:  Negative for dizziness, weakness and headaches.  Hematological:  Does not bruise/bleed easily.  All other systems reviewed and are negative.     Objective:   Physical Exam Vitals and nursing  note reviewed.  Constitutional:      General: She is not in acute distress.    Appearance: Normal appearance. She is well-developed.  HENT:     Head: Normocephalic.     Right Ear: Tympanic membrane normal.     Left Ear: Tympanic membrane normal.     Nose: Nose normal.     Mouth/Throat:     Mouth: Mucous membranes are moist.  Eyes:     Pupils: Pupils are equal, round, and reactive to light.  Neck:     Vascular: No carotid bruit or JVD.  Cardiovascular:     Rate and Rhythm: Normal rate and regular rhythm.     Heart sounds: Normal heart sounds.  Pulmonary:     Effort: Pulmonary effort is normal. No respiratory distress.     Breath sounds: Normal breath sounds. No wheezing or rales.  Chest:     Chest wall: No tenderness.  Abdominal:     General: Bowel sounds are normal. There is no distension or abdominal bruit.     Palpations: Abdomen is soft. There is no hepatomegaly, splenomegaly, mass or pulsatile mass.     Tenderness: There is no abdominal tenderness.  Musculoskeletal:        General: Normal range of motion.     Cervical back: Normal range of motion and neck supple.  Lymphadenopathy:     Cervical: No cervical adenopathy.  Skin:    General: Skin is warm and dry.  Neurological:     Mental Status: She is alert and oriented to person, place, and time.     Deep Tendon Reflexes: Reflexes are normal and symmetric.  Psychiatric:        Behavior: Behavior normal.        Thought Content: Thought content normal.        Judgment: Judgment normal.   BP 130/79    Pulse 69    Temp 97.8 F (36.6 C) (Temporal)    Resp 20    Ht 5\' 5"  (1.651 m)    Wt 143 lb (64.9 kg)    SpO2 98%    BMI 23.80 kg/m         Assessment & Plan:  [name] comes in today with chief complaint of Medical Management of Chronic Issues   Diagnosis and orders addressed:  1. Primary hypertension Low sodium diet - lisinopril (ZESTRIL) 20 MG tablet; Take 1 tablet (20 mg total) by mouth daily.   Dispense: 90 tablet; Refill: 1  2. Mixed hyperlipidemia Low fat diet - rosuvastatin (CRESTOR) 10 MG tablet; Take 1 tablet (10 mg total)  by mouth daily.  Dispense: 90 tablet; Refill: 1  3. Gastroesophageal reflux disease, unspecified whether esophagitis present Avoid spicy foods Do not eat 2 hours prior to bedtime - esomeprazole (NEXIUM) 40 MG capsule; Take 1 capsule (40 mg total) by mouth daily. Take 30 min before meal  Dispense: 90 capsule; Refill: 1  4. Recurrent major depressive disorder, in full remission ([place]) Stress management Refuses to change meds - venlafaxine XR (EFFEXOR-XR) 75 MG 24 hr capsule; TABLET 1 OR 2 CAPSULES DAILY AS DIRECTED  Dispense: 180 capsule; Refill: 1  5. GAD (generalized anxiety disorder) - ALPRAZolam (XANAX) 0.5 MG tablet; Take 1 tablet (0.5 mg total) by mouth 2 (two) times daily as needed.  Dispense: 60 tablet; Refill: 5   Labs pending Health Maintenance reviewed Diet and exercise encouraged  Follow up plan: 6 months   Mary-Margaret Hassell Done, FNP

## 2021-09-28 NOTE — Addendum Note (Signed)
Addended by: Cleda Daub on: 09/28/2021 03:22 PM   Modules accepted: Orders

## 2021-09-28 NOTE — Telephone Encounter (Signed)
Please look at dexa.

## 2021-09-28 NOTE — Addendum Note (Signed)
Addended by: Chevis Pretty on: 09/28/2021 02:59 PM   Modules accepted: Orders

## 2021-09-29 LAB — CMP14+EGFR
ALT: 9 IU/L (ref 0–32)
AST: 13 IU/L (ref 0–40)
Albumin/Globulin Ratio: 1.7 (ref 1.2–2.2)
Albumin: 4 g/dL (ref 3.8–4.8)
Alkaline Phosphatase: 72 IU/L (ref 44–121)
BUN/Creatinine Ratio: 27 (ref 12–28)
BUN: 20 mg/dL (ref 8–27)
Bilirubin Total: <0.2 mg/dL (ref 0.0–1.2)
CO2: 22 mmol/L (ref 20–29)
Calcium: 8.6 mg/dL — ABNORMAL LOW (ref 8.7–10.3)
Chloride: 108 mmol/L — ABNORMAL HIGH (ref 96–106)
Creatinine, Ser: 0.74 mg/dL (ref 0.57–1.00)
Globulin, Total: 2.4 g/dL (ref 1.5–4.5)
Glucose: 84 mg/dL (ref 70–99)
Potassium: 3.9 mmol/L (ref 3.5–5.2)
Sodium: 143 mmol/L (ref 134–144)
Total Protein: 6.4 g/dL (ref 6.0–8.5)
eGFR: 91 mL/min/1.73

## 2021-09-29 LAB — CBC WITH DIFFERENTIAL/PLATELET
Basophils Absolute: 0 x10E3/uL (ref 0.0–0.2)
Basos: 1 %
EOS (ABSOLUTE): 0.1 x10E3/uL (ref 0.0–0.4)
Eos: 2 %
Hematocrit: 36.5 % (ref 34.0–46.6)
Hemoglobin: 12.3 g/dL (ref 11.1–15.9)
Immature Grans (Abs): 0 x10E3/uL (ref 0.0–0.1)
Immature Granulocytes: 0 %
Lymphocytes Absolute: 1.8 x10E3/uL (ref 0.7–3.1)
Lymphs: 28 %
MCH: 32.4 pg (ref 26.6–33.0)
MCHC: 33.7 g/dL (ref 31.5–35.7)
MCV: 96 fL (ref 79–97)
Monocytes Absolute: 0.6 x10E3/uL (ref 0.1–0.9)
Monocytes: 8 %
Neutrophils Absolute: 4.1 x10E3/uL (ref 1.4–7.0)
Neutrophils: 61 %
Platelets: 369 x10E3/uL (ref 150–450)
RBC: 3.8 x10E6/uL (ref 3.77–5.28)
RDW: 12.4 % (ref 11.7–15.4)
WBC: 6.6 x10E3/uL (ref 3.4–10.8)

## 2021-09-29 LAB — LIPID PANEL
Chol/HDL Ratio: 2.9 ratio (ref 0.0–4.4)
Cholesterol, Total: 160 mg/dL (ref 100–199)
HDL: 56 mg/dL
LDL Chol Calc (NIH): 87 mg/dL (ref 0–99)
Triglycerides: 91 mg/dL (ref 0–149)
VLDL Cholesterol Cal: 17 mg/dL (ref 5–40)

## 2021-10-09 ENCOUNTER — Ambulatory Visit (HOSPITAL_COMMUNITY)
Admission: RE | Admit: 2021-10-09 | Discharge: 2021-10-09 | Disposition: A | Discharge status: Home / Self Care | Payer: 59 | Source: Ambulatory Visit | Attending: Family Medicine | Admitting: Family Medicine

## 2021-10-09 ENCOUNTER — Other Ambulatory Visit: Payer: Self-pay

## 2021-10-09 DIAGNOSIS — R011 Cardiac murmur, unspecified: Secondary | ICD-10-CM | POA: Insufficient documentation

## 2021-10-09 LAB — ECHOCARDIOGRAM COMPLETE
AR max vel: 1.71 cm2
AV Area VTI: 1.78 cm2
AV Area mean vel: 1.67 cm2
AV Mean grad: 5.5 mmHg
AV Peak grad: 11.6 mmHg
Ao pk vel: 1.7 m/s
Area-P 1/2: 4.06 cm2
S' Lateral: 3.1 cm

## 2021-10-09 NOTE — Progress Notes (Signed)
*  PRELIMINARY RESULTS* Echocardiogram 2D Echocardiogram has been performed.  Stacey Drain 10/09/2021, 3:53 PM

## 2021-10-12 ENCOUNTER — Other Ambulatory Visit: Payer: Self-pay | Admitting: Nurse Practitioner

## 2021-10-12 ENCOUNTER — Other Ambulatory Visit: Payer: Self-pay | Admitting: Family Medicine

## 2021-10-12 DIAGNOSIS — F411 Generalized anxiety disorder: Secondary | ICD-10-CM

## 2021-10-12 DIAGNOSIS — R9389 Abnormal findings on diagnostic imaging of other specified body structures: Secondary | ICD-10-CM

## 2021-10-12 NOTE — Progress Notes (Signed)
Pt w/ new murmur noted at her PE. TTE noted in chart and TEE recommended.

## 2021-11-23 ENCOUNTER — Other Ambulatory Visit: Payer: Self-pay | Admitting: Nurse Practitioner

## 2021-11-23 DIAGNOSIS — F3342 Major depressive disorder, recurrent, in full remission: Secondary | ICD-10-CM

## 2021-12-11 ENCOUNTER — Telehealth: Payer: Self-pay

## 2021-12-11 NOTE — Telephone Encounter (Signed)
Patient was calling ready to schd her ECHO TEE. Please advise ?

## 2021-12-24 ENCOUNTER — Other Ambulatory Visit: Payer: Self-pay | Admitting: Nurse Practitioner

## 2021-12-24 ENCOUNTER — Telehealth: Payer: Self-pay | Admitting: Nurse Practitioner

## 2021-12-24 DIAGNOSIS — F411 Generalized anxiety disorder: Secondary | ICD-10-CM

## 2021-12-26 ENCOUNTER — Encounter (HOSPITAL_COMMUNITY): Payer: Self-pay | Admitting: Cardiology

## 2022-01-02 ENCOUNTER — Ambulatory Visit (HOSPITAL_BASED_OUTPATIENT_CLINIC_OR_DEPARTMENT_OTHER): Payer: 59 | Admitting: Anesthesiology

## 2022-01-02 ENCOUNTER — Encounter (HOSPITAL_COMMUNITY)
Admission: RE | Disposition: A | Discharge status: Home / Self Care | Payer: Self-pay | Source: Home / Self Care | Attending: Cardiology

## 2022-01-02 ENCOUNTER — Encounter (HOSPITAL_COMMUNITY): Payer: Self-pay | Admitting: Cardiology

## 2022-01-02 ENCOUNTER — Ambulatory Visit (HOSPITAL_COMMUNITY): Payer: 59 | Admitting: Anesthesiology

## 2022-01-02 ENCOUNTER — Ambulatory Visit (HOSPITAL_COMMUNITY)
Admission: RE | Admit: 2022-01-02 | Discharge: 2022-01-02 | Disposition: A | Discharge status: Home / Self Care | Payer: 59 | Attending: Cardiology | Admitting: Cardiology

## 2022-01-02 ENCOUNTER — Ambulatory Visit (HOSPITAL_BASED_OUTPATIENT_CLINIC_OR_DEPARTMENT_OTHER)
Admission: RE | Admit: 2022-01-02 | Discharge: 2022-01-02 | Disposition: A | Discharge status: Home / Self Care | Payer: 59 | Source: Ambulatory Visit | Attending: Family Medicine | Admitting: Family Medicine

## 2022-01-02 DIAGNOSIS — R011 Cardiac murmur, unspecified: Secondary | ICD-10-CM

## 2022-01-02 DIAGNOSIS — E782 Mixed hyperlipidemia: Secondary | ICD-10-CM | POA: Diagnosis not present

## 2022-01-02 DIAGNOSIS — I10 Essential (primary) hypertension: Secondary | ICD-10-CM | POA: Diagnosis not present

## 2022-01-02 DIAGNOSIS — Z79899 Other long term (current) drug therapy: Secondary | ICD-10-CM | POA: Insufficient documentation

## 2022-01-02 DIAGNOSIS — K219 Gastro-esophageal reflux disease without esophagitis: Secondary | ICD-10-CM | POA: Diagnosis not present

## 2022-01-02 DIAGNOSIS — F418 Other specified anxiety disorders: Secondary | ICD-10-CM | POA: Diagnosis not present

## 2022-01-02 DIAGNOSIS — F411 Generalized anxiety disorder: Secondary | ICD-10-CM | POA: Insufficient documentation

## 2022-01-02 DIAGNOSIS — F3342 Major depressive disorder, recurrent, in full remission: Secondary | ICD-10-CM | POA: Insufficient documentation

## 2022-01-02 DIAGNOSIS — R9389 Abnormal findings on diagnostic imaging of other specified body structures: Secondary | ICD-10-CM | POA: Diagnosis not present

## 2022-01-02 HISTORY — PX: TEE WITHOUT CARDIOVERSION: SHX5443

## 2022-01-02 SURGERY — ECHOCARDIOGRAM, TRANSESOPHAGEAL
Anesthesia: Monitor Anesthesia Care

## 2022-01-02 MED ORDER — PROPOFOL 10 MG/ML IV BOLUS
INTRAVENOUS | Status: DC | PRN
  Administered 2022-01-02 (×2): 20 mg via INTRAVENOUS

## 2022-01-02 MED ORDER — SODIUM CHLORIDE 0.9 % IV SOLN: INTRAVENOUS | Status: DC | PRN

## 2022-01-02 MED ORDER — PROPOFOL 500 MG/50ML IV EMUL
INTRAVENOUS | Status: DC | PRN
  Administered 2022-01-02: 150 ug/kg/min via INTRAVENOUS

## 2022-01-02 NOTE — Anesthesia Postprocedure Evaluation (Signed)
Anesthesia Post Note ? ?Patient: [name] ? ?Procedure(s) Performed: TRANSESOPHAGEAL ECHOCARDIOGRAM (TEE) ? ?  ? ?Patient location during evaluation: Endoscopy ?Anesthesia Type: MAC ?Level of consciousness: awake and alert ?Pain management: pain level controlled ?Vital Signs Assessment: post-procedure vital signs reviewed and stable ?Respiratory status: spontaneous breathing, nonlabored ventilation and respiratory function stable ?Cardiovascular status: stable and blood pressure returned to baseline ?Postop Assessment: no apparent nausea or vomiting ?Anesthetic complications: no ? ? ?No notable events documented. ? ?Last Vitals:  ?Vitals:  ? 01/02/22 1348 01/02/22 1358  ?BP: (!) 153/69 (!) 146/78  ?Pulse: 68 70  ?Resp: 16 17  ?Temp:    ?SpO2: 95% 93%  ?  ?Last Pain:  ?Vitals:  ? 01/02/22 1358  ?TempSrc:   ?PainSc: 0-No pain  ? ? ?  ?  ?  ?  ?  ?  ? ?Juliya Magill,W. EDMOND ? ? ? ? ?

## 2022-01-02 NOTE — Interval H&P Note (Signed)
History and Physical Interval Note: ? ?01/02/2022 ?12:37 PM ? ?Janet Randolph  has presented today for surgery, with the diagnosis of HEART MURMUR.  The various methods of treatment have been discussed with the patient and family. After consideration of risks, benefits and other options for treatment, the patient has consented to  Procedure(s): ?TRANSESOPHAGEAL ECHOCARDIOGRAM (TEE) (N/A) as a surgical intervention.  The patient's history has been reviewed, patient examined, no change in status, stable for surgery.  I have reviewed the patient's chart and labs.  Questions were answered to the patient's satisfaction.   ? ? ?Olga Millers ? ? ?

## 2022-01-02 NOTE — H&P (Signed)
Office Visit ?09/28/2021 ?[place] ?Chevis Pretty, FNP ?Family Medicine Primary hypertension +4 more ?[facility] Medical Management of Chronic Issues; Referred by Chevis Pretty, FNP ?Reason for Visit  ? ?Additional Documentation ? ?Vitals:  BP 130/79 ?Pulse 69 ?Temp 97.8 ?F (36.6 ?C) (Temporal) ?Resp 20 ?Ht 5\' 5"  (1.651 m) ?Wt 64.9 kg ?SpO2 98% ?BMI 23.80 kg/m? ?BSA 1.73 m? ? ?More Vitals  ?Flowsheets:  MEWS Score, ?Anthropometrics, ?NEWS, ?[place], ?PHQ-9 Depression Scale, ?GAD-7:  Generalized Anxiety Disorder 7-Item Scale, ?BMI Metric Follow Up, ?Vital Signs, ?Method of Visit ?Marland Kitchen..(4 more)  ?Encounter Info:  Billing Info, ?History, ?Allergies, ?Detailed Report ?  ? ?All Notes ? ? ? Addendum Note by Rolena Infante, LPN at [id number] PM ? ?Author: Rolena Infante, LPN Author Type: Licensed Practical Nurse Filed: 09/28/2021  3:22 PM  ?Note Status: Signed Cosign: Cosign Not Required Encounter Date: 09/28/2021  ?Editor: Rolena Infante, LPN (Licensed Practical Nurse)      ?       ?  ?Addended by: Rolena Infante on: 09/28/2021 03:22 PM ? ?   Modules accepted: Orders ?  ? ?  ? ? ? Addendum Note by Chevis Pretty, FNP at 09/28/2021 2:30 PM ? ?Author: Chevis Pretty, FNP Author Type: Family Nurse Practitioner Filed: 09/28/2021  2:59 PM  ?Note Status: Signed Cosign: Cosign Not Required Encounter Date: 09/28/2021  ?Editor: Chevis Pretty, FNP (Family Nurse Practitioner)      ?       ?  ?Addended by: Chevis Pretty on: 09/28/2021 02:59 PM ? ?   Modules accepted: Orders ?  ? ?  ? ? ? Progress Notes by Chevis Pretty, FNP at 09/28/2021 2:30 PM ? ?Author: Chevis Pretty, FNP Author Type: Family Nurse Practitioner Filed: 09/28/2021  2:58 PM  ?Note Status: Signed Cosign: Cosign Not Required Encounter Date: 09/28/2021  ?Editor: Chevis Pretty, FNP (Family Nurse Practitioner)      ?       ?Expand AllCollapse All   ? ?  ?Subjective:  ?  ?  ?Subjective  ?  Patient ID: Janet Randolph, female    DOB: 02/04/1961, 61 y.o.   MRN: [id number] ?  ?Chief Complaint: medical management of chronic issues  ?  ?  ?HPI: ?  ?Janet Randolph is a 61 y.o. who identifies as a female who was assigned female at birth.  ?  ?Social history: ?Lives with: married ?  ?  ?  ?Comes in today for follow up of the following chronic medical issues: ?  ?1. Primary hypertension ?No c/o chest pain, sob or headache.does not check blood pressure at home. ?   ?BP Readings from Last 3 Encounters:  ?03/26/21 134/82  ?02/22/20 136/81  ?08/26/19 136/86  ?  ?  ?  ?2. Mixed hyperlipidemia ?Does not really watch diet very closely and does no dedicated exercise. ?Recent Labs  ?     ?Lab Results  ?Component Value Date  ?  CHOL 165 03/26/2021  ?  HDL 50 03/26/2021  ?  [place] 82 03/26/2021  ?  TRIG 196 (H) 03/26/2021  ?  CHOLHDL 3.3 03/26/2021  ?  ?  ?The 10-year ASCVD risk score (Arnett DK, et al., 2019) is: 10.2% ?  ?  ?3. Gastroesophageal reflux disease, unspecified whether esophagitis present ?Is on nexium daily and is doing well. ?  ?4. Recurrent major depressive disorder, in full remission ([place]) ?Is on effexor daily and is doing well. She is having a hard time but does  not want to change her meds. ?Depression screen [facility] 2/9 09/28/2021 03/26/2021 09/26/2020  ?Decreased Interest 2 0 0  ?Down, Depressed, Hopeless 2 1 1   ?PHQ - 2 Score 4 1 1   ?Altered sleeping 0 0 -  ?Tired, decreased energy 3 1 -  ?Change in appetite 2 0 -  ?Feeling bad or failure about yourself  2 0 -  ?Trouble concentrating 1 1 -  ?Moving slowly or fidgety/restless 0 0 -  ?Suicidal thoughts 0 0 -  ?PHQ-9 Score 12 3 -  ?Difficult doing work/chores Not difficult at all Not difficult at all -  ?Some recent data might be hidden  ?  ?  ?  ?5. GAD (generalized anxiety disorder) ?Is on xanax bid. Does not always take it 2x a day ?GAD 7 : Generalized Anxiety Score 09/28/2021 03/26/2021 09/26/2020 02/22/2020  ?Nervous, Anxious, on Edge 3 1 0 3   ?Control/stop worrying 1 1 0 0  ?Worry too much - different things 1 1 0 0  ?Trouble relaxing 0 1 0 0  ?Restless 0 0 0 0  ?Easily annoyed or irritable 2 0 1 1  ?Afraid - awful might happen 1 1 0 0  ?Total GAD 7 Score 8 5 1 4   ?Anxiety Difficulty Somewhat difficult Not difficult at all Not difficult at all -  ?  ?  ?  ?  ?  ?New complaints: ?None today ?  ?    ?Allergies  ?Allergen Reactions  ? Latex    ? Penicillins    ?  ?    ?Outpatient Encounter Medications as of 09/28/2021  ?Medication Sig  ? ALPRAZolam (XANAX) 0.5 MG tablet Take 1 tablet (0.5 mg total) by mouth 2 (two) times daily as needed.  ? clobetasol ointment (TEMOVATE) AB-123456789 % Apply 1 application topically daily. Large surface area  ? desonide (DESOWEN) 0.05 % cream Apply topically 2 (two) times daily.  ? esomeprazole (NEXIUM) 40 MG capsule TAKE 1 CAPSULE (40 MG TOTAL) BY MOUTH DAILY. TAKE 30 MIN BEFORE MEAL  ? lisinopril (ZESTRIL) 20 MG tablet Take 1 tablet (20 mg total) by mouth daily.  ? rosuvastatin (CRESTOR) 10 MG tablet Take 1 tablet (10 mg total) by mouth daily.  ? venlafaxine XR (EFFEXOR-XR) 75 MG 24 hr capsule TABLET 1 OR 2 CAPSULES DAILY AS DIRECTED  ?  ?No facility-administered encounter medications on file as of 09/28/2021.  ?  ?  ?     ?Past Surgical History:  ?Procedure Laterality Date  ? HAND SURGERY      ?  ?  ?     ?Family History  ?Problem Relation Age of Onset  ? Asthma Father    ? Crohn's disease Father    ? Melanoma Father    ?  ?  ?  ?  ?Controlled substance contract: n/a ?  ?  ?  ?  ?Review of Systems  ?Constitutional:  Negative for diaphoresis.  ?Eyes:  Negative for pain.  ?Respiratory:  Negative for shortness of breath.   ?Cardiovascular:  Negative for chest pain, palpitations and leg swelling.  ?Gastrointestinal:  Negative for abdominal pain.  ?Endocrine: Negative for polydipsia.  ?Skin:  Negative for rash.  ?Neurological:  Negative for dizziness, weakness and headaches.  ?Hematological:  Does not bruise/bleed easily.  ?All other  systems reviewed and are negative. ?  ?  ?  ?Objective:  ?  ?Objective  ? Physical Exam ?Vitals and nursing note reviewed.  ?Constitutional:   ?   General:  She is not in acute distress. ?   Appearance: Normal appearance. She is well-developed.  ?HENT:  ?   Head: Normocephalic.  ?   Right Ear: Tympanic membrane normal.  ?   Left Ear: Tympanic membrane normal.  ?   Nose: Nose normal.  ?   Mouth/Throat:  ?   Mouth: Mucous membranes are moist.  ?Eyes:  ?   Pupils: Pupils are equal, round, and reactive to light.  ?Neck:  ?   Vascular: No carotid bruit or JVD.  ?Cardiovascular:  ?   Rate and Rhythm: Normal rate and regular rhythm.  ?   Heart sounds: Normal heart sounds.  ?Pulmonary:  ?   Effort: Pulmonary effort is normal. No respiratory distress.  ?   Breath sounds: Normal breath sounds. No wheezing or rales.  ?Chest:  ?   Chest wall: No tenderness.  ?Abdominal:  ?   General: Bowel sounds are normal. There is no distension or abdominal bruit.  ?   Palpations: Abdomen is soft. There is no hepatomegaly, splenomegaly, mass or pulsatile mass.  ?   Tenderness: There is no abdominal tenderness.  ?Musculoskeletal:     ?   General: Normal range of motion.  ?   Cervical back: Normal range of motion and neck supple.  ?Lymphadenopathy:  ?   Cervical: No cervical adenopathy.  ?Skin: ?   General: Skin is warm and dry.  ?Neurological:  ?   Mental Status: She is alert and oriented to person, place, and time.  ?   Deep Tendon Reflexes: Reflexes are normal and symmetric.  ?Psychiatric:     ?   Behavior: Behavior normal.     ?   Thought Content: Thought content normal.     ?   Judgment: Judgment normal.  ?  ?BP 130/79   Pulse 69   Temp 97.8 ?F (36.6 ?C) (Temporal)   Resp 20   Ht 5\' 5"  (1.651 m)   Wt 143 lb (64.9 kg)   SpO2 98%   BMI 23.80 kg/m?  ?  ?  ?  ?  ?  ?  ?Assessment & Plan:  ?Janet Randolph comes in today with chief complaint of Medical Management of Chronic Issues ?  ?  ?Diagnosis and orders addressed: ?  ?1. Primary  hypertension ?Low sodium diet ?- lisinopril (ZESTRIL) 20 MG tablet; Take 1 tablet (20 mg total) by mouth daily.  Dispense: 90 tablet; Refill: 1 ?  ?2. Mixed hyperlipidemia ?Low fat diet ?- rosuvastatin (CRESTOR) 10 MG tabl

## 2022-01-02 NOTE — Progress Notes (Signed)
?  Echocardiogram ?Echocardiogram Transesophageal has been performed. ? Janet Randolph ?01/02/2022, 1:40 PM ?

## 2022-01-02 NOTE — Anesthesia Preprocedure Evaluation (Addendum)
Anesthesia Evaluation  ?Patient identified by MRN, date of birth, ID band ?Patient awake ? ? ? ?Reviewed: ?Allergy & Precautions, H&P , NPO status , Patient's Chart, lab work & pertinent test results ? ?Airway ?Mallampati: III ? ?TM Distance: >3 FB ?Neck ROM: Full ? ? ? Dental ?no notable dental hx. ?(+) Teeth Intact, Dental Advisory Given ?  ?Pulmonary ?Current SmokerPatient did not abstain from smoking.,  ?  ?Pulmonary exam normal ?breath sounds clear to auscultation ? ? ? ? ? ? Cardiovascular ?hypertension, Pt. on medications ? ?Rhythm:Regular Rate:Normal ? ? ?  ?Neuro/Psych ?Anxiety Depression negative neurological ROS ?   ? GI/Hepatic ?Neg liver ROS, GERD  Medicated,  ?Endo/Other  ?negative endocrine ROS ? Renal/GU ?negative Renal ROS  ?negative genitourinary ?  ?Musculoskeletal ? ? Abdominal ?  ?Peds ? Hematology ?negative hematology ROS ?(+)   ?Anesthesia Other Findings ? ? Reproductive/Obstetrics ?negative OB ROS ? ?  ? ? ? ? ? ? ? ? ? ? ? ? ? ?  ?  ? ? ? ? ? ? ? ?Anesthesia Physical ?Anesthesia Plan ? ?ASA: 2 ? ?Anesthesia Plan: MAC  ? ?Post-op Pain Management: Minimal or no pain anticipated  ? ?Induction: Intravenous ? ?PONV Risk Score and Plan: 1 and Propofol infusion ? ?Airway Management Planned: Natural Airway and Nasal Cannula ? ?Additional Equipment:  ? ?Intra-op Plan:  ? ?Post-operative Plan:  ? ?Informed Consent: I have reviewed the patients History and Physical, chart, labs and discussed the procedure including the risks, benefits and alternatives for the proposed anesthesia with the patient or authorized representative who has indicated his/her understanding and acceptance.  ? ? ? ?Dental advisory given ? ?Plan Discussed with: CRNA ? ?Anesthesia Plan Comments:   ? ? ? ? ? ? ?Anesthesia Quick Evaluation ? ?

## 2022-01-02 NOTE — Progress Notes (Signed)
? ? ?  Transesophageal Echocardiogram Note ? ?Stephania Fragmin ?[id number] ?1961/07/27 ? ?Procedure: Transesophageal Echocardiogram ?Indications: RV mass ? ?Procedure Details ?Consent: Obtained ?Time Out: Verified patient identification, verified procedure, site/side was marked, verified correct patient position, special equipment/implants available, Radiology Safety Procedures followed,  medications/allergies/relevent history reviewed, required imaging and test results available.  Performed ? ?Medications: ? ?Pt sedated by anesthesia with diprovan 192 mg IV. ? ?Normal LV function; no RV mass. ? ? ?Complications: No apparent complications ?Patient did tolerate procedure well. ? ?Olga Millers, MD  ? ?

## 2022-01-02 NOTE — Discharge Instructions (Signed)

## 2022-01-02 NOTE — Transfer of Care (Signed)
Immediate Anesthesia Transfer of Care Note ? ?Patient: Janet Randolph ? ?Procedure(s) Performed: TRANSESOPHAGEAL ECHOCARDIOGRAM (TEE) ? ?Patient Location: Endoscopy Unit ? ?Anesthesia Type:MAC ? ?Level of Consciousness: awake, alert  and oriented ? ?Airway & Oxygen Therapy: Patient Spontanous Breathing and Patient connected to nasal cannula oxygen ? ?Post-op Assessment: Report given to RN and Post -op Vital signs reviewed and stable ? ?Post vital signs: Reviewed and stable ? ?Last Vitals:  ?Vitals Value Taken Time  ?BP 109/39 01/02/22 1338  ?Temp    ?Pulse 72 01/02/22 1338  ?Resp 16 01/02/22 1338  ?SpO2 95 % 01/02/22 1338  ? ? ?Last Pain:  ?Vitals:  ? 01/02/22 1338  ?TempSrc:   ?PainSc: 0-No pain  ?   ? ?  ? ?Complications: No notable events documented. ?

## 2022-01-03 ENCOUNTER — Encounter (HOSPITAL_COMMUNITY): Payer: Self-pay | Admitting: Cardiology

## 2022-01-07 NOTE — H&P (Signed)
?Lewayne Bunting, MD ?Physician ?Cardiology ?Interval H&P Note    ?Signed ?Date of Service:  01/02/2022 12:37 PM ?  ?Signed    ?  ?   ?   ?   ?   ?   ?   ?   ?   ?   ?   ?   ?   ?   ?   ?   ?   ?   ?   ?   ?   ?   ?History and Physical Interval Note: ?  ?01/02/2022 ?12:37 PM ?  ?Janet Randolph  has presented today for surgery, with the diagnosis of HEART MURMUR.  The various methods of treatment have been discussed with the patient and family. After consideration of risks, benefits and other options for treatment, the patient has consented to  Procedure(s): ?TRANSESOPHAGEAL ECHOCARDIOGRAM (TEE) (N/A) as a surgical intervention.  The patient's history has been reviewed, patient examined, no change in status, stable for surgery.  I have reviewed the patient's chart and labs.  Questions were answered to the patient's satisfaction.   ?  ?  ?Olga Millers ?  ?   ?  ?  ?  ? ?Note Details ? ?Rivka Spring, Madolyn Frieze, MD File Time 01/02/2022 12:37 PM  ?Author Type Physician Status Signed  ?Last Editor Lewayne Bunting, MD Service Cardiology  ?Hospital Acct # [id number] Admit Date 01/02/2022  ? ?  ?Lewayne Bunting, MD ?Physician ?Cardiology ?H&P    ?Signed ?Date of Service:  01/02/2022 12:35 PM ? ? ? ?  ?Signed    ?  ? ?   ?   ?   ?   ?   ?   ?   ?   ?   ?   ? ?Office Visit ?09/28/2021 ?[facility] ?Bennie Pierini, FNP ?Family Medicine Primary hypertension +4 more ?[facility] Medical Management of Chronic Issues; Referred by Bennie Pierini, FNP ?Reason for Visit  ?  ?Additional Documentation ?  ?Vitals:  BP 130/79 ?Pulse 69 ?Temp 97.8 ?F (36.6 ?C) (Temporal) ?Resp 20 ?Ht 5\' 5"  (1.651 m) ?Wt 64.9 kg ?SpO2 98% ?BMI 23.80 kg/m? ?BSA 1.73 m? ?  ?More Vitals  ?Flowsheets:  MEWS Score, ?Anthropometrics, ?NEWS, ?Falls, ?PHQ-9 Depression Scale, ?GAD-7:  Generalized Anxiety Disorder 7-Item Scale, ?BMI Metric Follow Up, ?Vital Signs, ?Method of Visit ? ..(4 more)  ?Encounter Info:  Billing  Info, ?History, ?Allergies, ?Detailed Report ?   ?  ?All Notes ?  ?  ? Addendum Note by Marland Kitchen, LPN at [facility] 2:30 PM ?  ?        ?Author: 1/[id number]/[id number], LPN Author Type: Licensed Practical Nurse Filed: 09/28/2021  3:22 PM   ?Note Status: Signed Cosign: Cosign Not Required Encounter Date: 09/28/2021   ?Editor: 09/30/2021, LPN (Licensed Practical Nurse)           ?              ?  ?Addended by: Cleda Daub on: 09/28/2021 03:22 PM ? ?   Modules accepted: Orders ?   ?  ?   ?  ?  ? Addendum Note by 09/30/2021, FNP at 09/28/2021 2:30 PM ?  ?        ?Author: 09/30/2021, FNP Author Type: Family Nurse Practitioner Filed: 09/28/2021  2:59 PM   ?Note Status: Signed Cosign: Cosign Not Required Encounter Date: 09/28/2021   ?Editor: 09/30/2021, FNP (Family  Nurse Practitioner)           ?              ?  ?Addended by: Bennie PieriniMARTIN, MARY-MARGARET on: 09/28/2021 02:59 PM ? ?   Modules accepted: Orders ?   ?  ?   ?  ?  ? Progress Notes by Bennie PieriniMartin, Mary-Margaret, FNP at 09/28/2021 2:30 PM ?  ?        ?Author: Bennie PieriniMartin, Mary-Margaret, FNP Author Type: Family Nurse Practitioner Filed: 09/28/2021  2:58 PM   ?Note Status: Signed Cosign: Cosign Not Required Encounter Date: 09/28/2021   ?Editor: Bennie PieriniMartin, Mary-Margaret, FNP (Family Nurse Practitioner)           ?              ?Expand AllCollapse All   ? ?  ?Subjective:  ?  ?  ?Subjective  ? Patient ID: Janet Randolph, female    DOB: [birth year], 61 y.o.   MRN: [id number] ?  ?Chief Complaint: medical management of chronic issues  ?  ?  ?HPI: ?  ?Janet Fragminonya H Eifler is a 61 y.o. who identifies as a female who was assigned female at birth.  ?  ?Social history: ?Lives with: married ?  ?  ?  ?Comes in today for follow up of the following chronic medical issues: ?  ?1. Primary hypertension ?No c/o chest pain, sob or headache.does not check blood pressure at home. ?     ?BP Readings from Last 3 Encounters:  ?03/26/21 134/82  ?02/22/20 136/81  ?08/26/19 136/86   ?  ?  ?  ?2. Mixed hyperlipidemia ?Does not really watch diet very closely and does no dedicated exercise. ?Recent Labs  ?         ?Lab Results  ?Component Value Date  ?  CHOL 165 03/26/2021  ?  HDL 50 03/26/2021  ?  LDLCALC 82 03/26/2021  ?  TRIG 196 (H) 03/26/2021  ?  CHOLHDL 3.3 03/26/2021  ?  ?  ?The 10-year ASCVD risk score (Arnett DK, et al., 2019) is: 10.2% ?  ?  ?3. Gastroesophageal reflux disease, unspecified whether esophagitis present ?Is on nexium daily and is doing well. ?  ?4. Recurrent major depressive disorder, in full remission (HCC) ?Is on effexor daily and is doing well. She is having a hard time but does not want to change her meds. ?[facility] 09/28/2021 03/26/2021 09/26/2020  ?Decreased Interest 2 0 0  ?Down, Depressed, Hopeless 2 1 1   ?PHQ - 2 Score 4 1 1   ?Altered sleeping 0 0 -  ?Tired, decreased energy 3 1 -  ?Change in appetite 2 0 -  ?Feeling bad or failure about yourself  2 0 -  ?Trouble concentrating 1 1 -  ?Moving slowly or fidgety/restless 0 0 -  ?Suicidal thoughts 0 0 -  ?PHQ-9 Score 12 3 -  ?Difficult doing work/chores Not difficult at all Not difficult at all -  ?Some recent data might be hidden  ?  ?  ?  ?5. GAD (generalized anxiety disorder) ?Is on xanax bid. Does not always take it 2x a day ?GAD 7 : Generalized Anxiety Score 09/28/2021 03/26/2021 09/26/2020 02/22/2020  ?Nervous, Anxious, on Edge 3 1 0 3  ?Control/stop worrying 1 1 0 0  ?Worry too much - different things 1 1 0 0  ?Trouble relaxing 0 1 0 0  ?Restless 0 0 0 0  ?Easily annoyed or irritable 2 0 1 1  ?Afraid -  awful might happen 1 1 0 0  ?Total GAD 7 Score 8 5 1 4   ?Anxiety Difficulty Somewhat difficult Not difficult at all Not difficult at all -  ?  ?  ?  ?  ?  ?New complaints: ?None today ?  ?       ?Allergies  ?Allergen Reactions  ? Latex    ? Penicillins    ?  ?       ?Outpatient Encounter Medications as of 09/28/2021  ?Medication Sig  ? ALPRAZolam (XANAX) 0.5 MG tablet Take 1 tablet (0.5 mg total) by  mouth 2 (two) times daily as needed.  ? clobetasol ointment (TEMOVATE) 0.05 % Apply 1 application topically daily. Large surface area  ? desonide (DESOWEN) 0.05 % cream Apply topically 2 (two) times daily.  ? esomeprazole (NEXIUM) 40 MG capsule TAKE 1 CAPSULE (40 MG TOTAL) BY MOUTH DAILY. TAKE 30 MIN BEFORE MEAL  ? lisinopril (ZESTRIL) 20 MG tablet Take 1 tablet (20 mg total) by mouth daily.  ? rosuvastatin (CRESTOR) 10 MG tablet Take 1 tablet (10 mg total) by mouth daily.  ? venlafaxine XR (EFFEXOR-XR) 75 MG 24 hr capsule TABLET 1 OR 2 CAPSULES DAILY AS DIRECTED  ?  ?No facility-administered encounter medications on file as of 09/28/2021.  ?  ?  ?         ?Past Surgical History:  ?Procedure Laterality Date  ? HAND SURGERY      ?  ?  ?         ?Family History  ?Problem Relation Age of Onset  ? Asthma Father    ? Crohn's disease Father    ? Melanoma Father    ?  ?  ?  ?  ?Controlled substance contract: n/a ?  ?  ?  ?  ?Review of Systems  ?Constitutional:  Negative for diaphoresis.  ?Eyes:  Negative for pain.  ?Respiratory:  Negative for shortness of breath.   ?Cardiovascular:  Negative for chest pain, palpitations and leg swelling.  ?Gastrointestinal:  Negative for abdominal pain.  ?Endocrine: Negative for polydipsia.  ?Skin:  Negative for rash.  ?Neurological:  Negative for dizziness, weakness and headaches.  ?Hematological:  Does not bruise/bleed easily.  ?All other systems reviewed and are negative. ?  ?  ?  ?Objective:  ?  ?Objective  ? Physical Exam ?Vitals and nursing note reviewed.  ?Constitutional:   ?   General: She is not in acute distress. ?   Appearance: Normal appearance. She is well-developed.  ?HENT:  ?   Head: Normocephalic.  ?   Right Ear: Tympanic membrane normal.  ?   Left Ear: Tympanic membrane normal.  ?   Nose: Nose normal.  ?   Mouth/Throat:  ?   Mouth: Mucous membranes are moist.  ?Eyes:  ?   Pupils: Pupils are equal, round, and reactive to light.  ?Neck:  ?   Vascular: No carotid bruit or JVD.   ?Cardiovascular:  ?   Rate and Rhythm: Normal rate and regular rhythm.  ?   Heart sounds: Normal heart sounds.  ?Pulmonary:  ?   Effort: Pulmonary effort is normal. No respiratory distress.  ?   Breath sounds: Norm

## 2022-01-09 NOTE — Progress Notes (Signed)
? ?Office Visit Note ? ?Patient: Janet Randolph             ?Date of Birth: 11/15/1960           ?MRN: [id number]             ?PCP: Chevis Pretty, FNP ?Referring: Waldemar Dickens, MD ?Visit Date: 01/23/2022 ?Occupation: @GUAROCC @ ? ?Subjective:  ?Treatment of osteoporosis ? ?History of Present Illness: Janet Randolph is a 61 y.o. female seen in consultation per request of Dr. Marily Memos.  Cording the patient she had menopause at age 10.  In 2020 she fell on her left arm and fractured her left wrist.  She states she had a plate placement which healed well.  In 2020 when she tripped and fell on her right wrist.  She had surgery for wrist fracture.  She states she has limited range of motion in her right wrist.  She is some stiffness in her hands.  She denies any joint swelling.  She states she had a physical in 2022 and a DEXA was ordered in December 2022 which showed a T score of -3.2.  She cannot take oral bisphosphonates because she has gastroesophageal reflux.  She was referred to me for further evaluation.  Patient gives history of smoking 1 pack/day for the last 25 years.  As she also has this longstanding history of gastroesophageal reflux and has taken Nexium.  She has a thyroid nodule but she has not taken Synthroid.  She has not taken any antiseizure medications in the past.  She is on antidepressants for anxiety and depression.  She does not exercise on a regular basis.  She does not take calcium and vitamin D on a regular basis.  There is no family history of osteoporosis or femoral fracture. ? ?Activities of Daily Living:  ?Patient reports morning stiffness for 0 minutes.   ?Patient Denies nocturnal pain.  ?Difficulty dressing/grooming: Denies ?Difficulty climbing stairs: Denies ?Difficulty getting out of chair: Denies ?Difficulty using hands for taps, buttons, cutlery, and/or writing: Denies ? ?Review of Systems  ?Constitutional:  Positive for fatigue.  ?HENT:  Positive for mouth dryness.  Negative for mouth sores and nose dryness.   ?Eyes:  Positive for dryness. Negative for pain and itching.  ?Respiratory:  Positive for shortness of breath. Negative for difficulty breathing.   ?Cardiovascular:  Negative for chest pain and palpitations.  ?Gastrointestinal:  Positive for diarrhea. Negative for blood in stool and constipation.  ?Endocrine: Positive for excessive thirst. Negative for increased urination.  ?Genitourinary:  Negative for difficulty urinating.  ?Musculoskeletal:  Negative for joint pain, joint pain, joint swelling, myalgias, morning stiffness, muscle tenderness and myalgias.  ?Skin:  Negative for color change, rash and redness.  ?Allergic/Immunologic: Negative for susceptible to infections.  ?Neurological:  Positive for headaches. Negative for dizziness, numbness, memory loss and weakness.  ?Hematological:  Positive for bruising/bleeding tendency.  ?Psychiatric/Behavioral:  Negative for confusion.   ? ?PMFS History:  ?Patient Active Problem List  ? Diagnosis Date Noted  ? GERD (gastroesophageal reflux disease) 04/03/2016  ? Eczema of both hands 07/12/2015  ? GAD (generalized anxiety disorder) 06/04/2013  ? Hyperlipidemia 03/05/2013  ? Hypertension 03/05/2013  ? Depression 02/09/2013  ?  ?Past Medical History:  ?Diagnosis Date  ? Depression   ? Eczema   ? Hyperlipidemia   ? Hypertension   ? Vitamin D deficiency   ?  ?Family History  ?Problem Relation Age of Onset  ? Melanoma Mother   ? Asthma  Father   ? Crohn's disease Father   ? Melanoma Father   ? Asthma Brother   ? Heart Problems Brother   ? Stroke Brother   ? Healthy Son   ? Healthy Daughter   ? ?Past Surgical History:  ?Procedure Laterality Date  ? HAND SURGERY Bilateral   ? TEE WITHOUT CARDIOVERSION N/A 01/02/2022  ? Procedure: TRANSESOPHAGEAL ECHOCARDIOGRAM (TEE);  Surgeon: Lelon Perla, MD;  Location: [facility] ENDOSCOPY;  Service: Cardiovascular;  Laterality: N/A;  ? ?Social History  ? ?Social History Narrative  ? Not on file   ? ?Immunization History  ?Administered Date(s) Administered  ? PFIZER(Purple Top)SARS-COV-2 Vaccination 11/25/2019, 12/20/2019, 08/29/2020  ? Tdap 07/23/2010, 07/20/2021  ? Zoster Recombinat (Shingrix) 09/28/2021  ?  ? ?Objective: ?Vital Signs: BP (!) 153/91 (BP Location: Right Arm, Patient Position: Sitting, Cuff Size: Normal)   Pulse 74   Ht 5' 3.5" (1.613 m)   Wt 141 lb (64 kg)   BMI 24.59 kg/m?   ? ?Physical Exam ?Vitals and nursing note reviewed.  ?Constitutional:   ?   Appearance: She is well-developed.  ?HENT:  ?   Head: Normocephalic and atraumatic.  ?Eyes:  ?   Conjunctiva/sclera: Conjunctivae normal.  ?Cardiovascular:  ?   Rate and Rhythm: Normal rate and regular rhythm.  ?   Heart sounds: Normal heart sounds.  ?Pulmonary:  ?   Effort: Pulmonary effort is normal.  ?   Breath sounds: Normal breath sounds.  ?Abdominal:  ?   General: Bowel sounds are normal.  ?   Palpations: Abdomen is soft.  ?Musculoskeletal:  ?   Cervical back: Normal range of motion.  ?Lymphadenopathy:  ?   Cervical: No cervical adenopathy.  ?Skin: ?   General: Skin is warm and dry.  ?   Capillary Refill: Capillary refill takes less than 2 seconds.  ?Neurological:  ?   Mental Status: She is alert and oriented to person, place, and time.  ?Psychiatric:     ?   Behavior: Behavior normal.  ?  ? ?Musculoskeletal Exam: C-spine was in good range of motion.  She has postural kyphosis.  Shoulder joints, and elbow joints with good range of motion.  She had good range of motion of her left wrist joint.  Right wrist joint had some limited extension and flexion.  Bilateral PIP and DIP thickening was noted.  Hip joints and knee joints with good range of motion.  She had no tenderness over ankles or MTPs. ? ?CDAI Exam: ?CDAI Score: -- ?Patient Global: --; Provider Global: -- ?Swollen: --; Tender: -- ?Joint Exam 01/23/2022  ? ?No joint exam has been documented for this visit  ? ?There is currently no information documented on the homunculus. Go to  the Rheumatology activity and complete the homunculus joint exam. ? ?Investigation: ?No additional findings. ? ?Imaging: ?ECHO TEE ? ?Result Date: 01/02/2022 ?   TRANSESOPHOGEAL ECHO REPORT   Patient Name:   Janet Randolph Date of Exam: 01/02/2022 Medical Rec #:  [id number]         Height:       65.0 in Accession #:    [id number]        Weight:       143.1 lb Date of Birth:  [birth year]         BSA:          1.716 m? Patient Age:    65 years          BP:  154/91 mmHg Patient Gender: F                 HR:           80 bpm. Exam Location:  Outpatient Procedure: Transesophageal Echo and Color Doppler Indications:     Abnormal radiologic density [id number]  History:         Patient has prior history of Echocardiogram examinations, most                  recent 10/09/2021. Risk Factors:Hypertension and Dyslipidemia.  Sonographer:     Darlina Sicilian RDCS Referring Phys:  [place] Diagnosing Phys: Kirk Ruths MD PROCEDURE: After discussion of the risks and benefits of a TEE, an informed consent was obtained from the patient. TEE procedure time was 8 minutes. The transesophogeal probe was passed without difficulty through the esophogus of the patient. Imaged were  obtained with the patient in a left lateral decubitus position. Sedation performed by different physician. The patient was monitored while under deep sedation. Anesthestetic sedation was provided intravenously by Anesthesiology: 192.52mg  of Propofol. Image quality was good. The patient's vital signs; including heart rate, blood pressure, and oxygen saturation; remained stable throughout the procedure. The patient developed no complications during the procedure. IMPRESSIONS  1. No RV mass indentified.  2. Left ventricular ejection fraction, by estimation, is 60 to 65%. The left ventricle has normal function. The left ventricle has no regional wall motion abnormalities.  3. Right ventricular systolic function is normal. The right ventricular size  is normal.  4. No left atrial/left atrial appendage thrombus was detected.  5. The mitral valve is normal in structure. Trivial mitral valve regurgitation.  6. The aortic valve is tricuspid. Aortic valve regurgitation

## 2022-01-23 ENCOUNTER — Telehealth: Payer: Self-pay | Admitting: Pharmacist

## 2022-01-23 ENCOUNTER — Ambulatory Visit: Payer: 59 | Admitting: Rheumatology

## 2022-01-23 ENCOUNTER — Encounter: Payer: Self-pay | Admitting: Rheumatology

## 2022-01-23 VITALS — BP 153/91 | HR 74 | Ht 63.5 in | Wt 141.0 lb

## 2022-01-23 DIAGNOSIS — F411 Generalized anxiety disorder: Secondary | ICD-10-CM

## 2022-01-23 DIAGNOSIS — M19041 Primary osteoarthritis, right hand: Secondary | ICD-10-CM | POA: Diagnosis not present

## 2022-01-23 DIAGNOSIS — Z8781 Personal history of (healed) traumatic fracture: Secondary | ICD-10-CM

## 2022-01-23 DIAGNOSIS — E041 Nontoxic single thyroid nodule: Secondary | ICD-10-CM

## 2022-01-23 DIAGNOSIS — M818 Other osteoporosis without current pathological fracture: Secondary | ICD-10-CM

## 2022-01-23 DIAGNOSIS — F3342 Major depressive disorder, recurrent, in full remission: Secondary | ICD-10-CM

## 2022-01-23 DIAGNOSIS — L309 Dermatitis, unspecified: Secondary | ICD-10-CM

## 2022-01-23 DIAGNOSIS — F32A Depression, unspecified: Secondary | ICD-10-CM

## 2022-01-23 DIAGNOSIS — F17200 Nicotine dependence, unspecified, uncomplicated: Secondary | ICD-10-CM

## 2022-01-23 DIAGNOSIS — Z79899 Other long term (current) drug therapy: Secondary | ICD-10-CM | POA: Diagnosis not present

## 2022-01-23 DIAGNOSIS — Z8709 Personal history of other diseases of the respiratory system: Secondary | ICD-10-CM

## 2022-01-23 DIAGNOSIS — I10 Essential (primary) hypertension: Secondary | ICD-10-CM

## 2022-01-23 DIAGNOSIS — F419 Anxiety disorder, unspecified: Secondary | ICD-10-CM

## 2022-01-23 DIAGNOSIS — E782 Mixed hyperlipidemia: Secondary | ICD-10-CM

## 2022-01-23 DIAGNOSIS — R011 Cardiac murmur, unspecified: Secondary | ICD-10-CM

## 2022-01-23 DIAGNOSIS — Z8719 Personal history of other diseases of the digestive system: Secondary | ICD-10-CM

## 2022-01-23 DIAGNOSIS — M19042 Primary osteoarthritis, left hand: Secondary | ICD-10-CM

## 2022-01-23 NOTE — Telephone Encounter (Signed)
Please start Forteo/teriparatide BIV (insurance may prefer generic so ok to run auth for generic if that's the case) ? ?Dose: 66mcg once daily (1 pen is good for 28 days only) ? ?Dx: osteoporosis without current pathological fracture (M81.8), history of wrist fracture (Z87.81) ? ?Therapies patient unable to try: ?Bisphosphonate (alendronate, risedronate, ibrandronate) due to active GERD ?She has severe osteoporosis so anabolic agent like Forteo is ideal ? ?Once approved, patient is eligible for copay card. She will let me know if she wants to complete hte first dose in the clinic with me. Rx for pen needles will have to be sent separately to local pharmacy. ? ?Knox Saliva, PharmD, MPH, BCPS, CPP ?Clinical Pharmacist (Rheumatology and Pulmonology) ? ? ?

## 2022-01-23 NOTE — Patient Instructions (Addendum)
Please ensure you are taking calcium 600mg  twice daily and Vitamin D 1000 units daily. ? ?Teriparatide injection ?What is this medication? ?TERIPARATIDE (terr ih PAR a tyd) increases bone mass and strength. It helps to make healthy bone and to slow bone loss in people with osteoporosis. This medicine helps prevent bone fractures. ?This medicine may be used for other purposes; ask your health care provider or pharmacist if you have questions. ?COMMON BRAND NAME(S): FORTEO ?What should I tell my care team before I take this medication? ?They need to know if you have any of these conditions: ?bone disease other than osteoporosis ?high levels of calcium in the blood ?history of cancer in the bone ?kidney stone ?Paget's disease ?parathyroid disease ?receiving radiation therapy ?an unusual or allergic reaction to teriparatide, other medicines, foods, dyes, or preservatives ?pregnant or trying to get pregnant ?breast-feeding ?How should I use this medication? ?This medicine is for injection under the skin. You will be taught how to prepare and give this medicine. Use exactly as directed. Take your medicine at regular intervals. Do not take your medicine more often than directed. ?This drug comes with INSTRUCTIONS FOR USE. Ask your pharmacist for directions on how to use this drug. Read the information carefully. Talk to your pharmacist or health care provider if you have questions. ?It is important that you put your used needles and pens in a special sharps container. Do not put them in a trash can. If you do not have a sharps container, call your pharmacist or health care provider to get one. ?A special MedGuide will be given to you by the pharmacist with each prescription and refill. Be sure to read this information carefully each time. ?Talk to your pediatrician regarding the use of this medicine in children. Special care may be needed. ?Overdosage: If you think you have taken too much of this medicine contact a poison  control center or emergency room at once. ?NOTE: This medicine is only for you. Do not share this medicine with others. ?What if I miss a dose? ?If you miss a dose, take it as soon as you can. If it is almost time for your next dose, take only that dose. Do not take double or extra doses. ?What may interact with this medication? ?digoxin ?This list may not describe all possible interactions. Give your health care provider a list of all the medicines, herbs, non-prescription drugs, or dietary supplements you use. Also tell them if you smoke, drink alcohol, or use illegal drugs. Some items may interact with your medicine. ?What should I watch for while using this medication? ?Visit your doctor or health care professional for regular checks on your progress. Your doctor may order blood tests and other tests to see how you are doing. ?You should make sure you get enough calcium and vitamin D while you are taking this medicine, unless your doctor tells you not to. Discuss the foods you eat and the vitamins you take with your health care professional. ?You may get drowsy or dizzy. Do not drive, use machinery, or do anything that needs mental alertness until you know how this medicine affects you. Do not stand or sit up quickly, especially if you are an older patient. This reduces the risk of dizzy or fainting spells. ?Talk to your doctor about your risk of cancer. You may be more at risk for certain types of cancers if you take this medicine. ?What side effects may I notice from receiving this medication? ?Side effects that  you should report to your doctor or health care professional as soon as possible: ?allergic reactions like skin rash, itching or hives, swelling of the face, lips, or tongue ?blood in the urine; pain in the lower back or side; pain when urinating ?signs and symptoms of low blood pressure like dizziness; feeling faint or lightheaded, falls; unusually weak or tired ?signs and symptoms of increased  calcium like nausea; vomiting; constipation; low energy; or muscle weakness ?Side effects that usually do not require medical attention (report these to your doctor or health care professional if they continue or are bothersome): ?headache ?joint pain ?nausea ?pain, redness, irritation or swelling at the injection site ?stomach upset ?This list may not describe all possible side effects. Call your doctor for medical advice about side effects. You may report side effects to FDA at 1-800-FDA-1088. ?Where should I keep my medication? ?Keep out of the reach of children. ?Store the pens in a refrigerator between 2 and 8 degrees C (36 and 46 degrees F). Do not freeze. Use the pen quickly after taking out of the refrigerator and recap and return to refrigerator right after using. Protect from light. Throw away any unused medicine 28 days after the first injection from the pen. Throw away any unopened, unused medicine after the expiration date on the label. ?NOTE: This sheet is a summary. It may not cover all possible information. If you have questions about this medicine, talk to your doctor, pharmacist, or health care provider. ?? 2023 Elsevier/Gold Standard (2021-07-27 00:00:00) ? ?

## 2022-01-23 NOTE — Telephone Encounter (Addendum)
Sent a Prior Authorization request to [facility] for FORTEO via CoverMyMeds. Will update once we receive a response. Awaiting clinical questions to populate. ? ?Key: [id number] ? ?Chesley Mires, PharmD, MPH, BCPS, CPP ?Clinical Pharmacist (Rheumatology and Pulmonology) ? ?

## 2022-01-23 NOTE — Progress Notes (Signed)
Pharmacy Note ? ?Subjective:  ?Patient presents today to [facility] for follow up office visit.   Patient was seen by the pharmacist for counseling on Forteo.  ? ?Objective: ?CMP  ?   ?Component Value Date/Time  ? NA 143 09/28/2021 1510  ? K 3.9 09/28/2021 1510  ? CL 108 (H) 09/28/2021 1510  ? CO2 22 09/28/2021 1510  ? GLUCOSE 84 09/28/2021 1510  ? BUN 20 09/28/2021 1510  ? CREATININE 0.74 09/28/2021 1510  ? CALCIUM 8.6 (L) 09/28/2021 1510  ? PROT 6.4 09/28/2021 1510  ? ALBUMIN 4.0 09/28/2021 1510  ? AST 13 09/28/2021 1510  ? ALT 9 09/28/2021 1510  ? ALKPHOS 72 09/28/2021 1510  ? BILITOT <0.2 09/28/2021 1510  ? GFRNONAA 97 02/22/2020 1257  ? GFRAA 112 02/22/2020 1257  ? ? ?Vitamin D ?No results found for: VD25OH ? ?DEXA 09/06/21:LFN BMD 0.491 T-score -3.2 ? ?Assessment/Plan:  ?  ?Counseled patient on purpose, proper use, storage, and adverse effects of Forteo. Discussed the Forteo is PTH peptide agonist which results in stimulation of osteoblast and bone mass. Reviewed that Forteo treatment course is for 2 years. Discussed that pen is stable for 28 days after first use and must be stored in fridge after each dose. Counseled patient that [place] is a medication that must be injected once daily and that prescription for pen needles will be sent with Forteo prescription.  Advised patient to continue taking calcium 1200 mg daily and vitamin D supplement.  Reviewed the most common adverse effects of Forteo including orthostatic hypotension, GI upset, injection site reaction, and joint pain/arthralgia.  Discussed injection site reaction management and injection site locations. Discussed alternating injection site.  Discussed management of dizziness (which can occur for up to 4 hours after injection) including adequate hydration, slowly rising from bed/chairs to prevent fals, and taking Forteo at night if needed.  We walked through [employer] administration with demo pen and pen needle.  Discussed appropriate  disposal of sharps.  ? ?Forteo prescription pending baseline labs and pharmacy benefits investigation. ?  ?All questions encouraged and answered. ? ?Chesley Mires, PharmD, MPH, BCPS, CPP ?Clinical Pharmacist (Rheumatology and Pulmonology)   ?

## 2022-01-24 ENCOUNTER — Other Ambulatory Visit: Payer: Self-pay | Admitting: *Deleted

## 2022-01-24 DIAGNOSIS — E559 Vitamin D deficiency, unspecified: Secondary | ICD-10-CM

## 2022-01-24 MED ORDER — VITAMIN D (ERGOCALCIFEROL) 1.25 MG (50000 UNIT) PO CAPS
50000.0000 [IU] | ORAL_CAPSULE | ORAL | 0 refills | Status: DC
Start: 2022-01-24 — End: 2022-12-19

## 2022-01-24 NOTE — Telephone Encounter (Signed)
Clinical questions completed. Submitted a Prior Authorization request to [facility] for [place] via CoverMyMeds. Will update once we receive a response.   Key: Sharlette Dense, PharmD, MPH, BCPS, CPP Clinical Pharmacist (Rheumatology and Pulmonology)

## 2022-01-24 NOTE — Progress Notes (Signed)
Vitamin D is low.  Please advise patient to start vitamin D 50,000 units once a week for 3 months.  We will check vitamin D level in 3 months.  After she finishes the course of vitamin D, she should start on vitamin D 2000 units daily.

## 2022-01-24 NOTE — Telephone Encounter (Signed)
-----   Message from Bo Merino, MD sent at 01/24/2022  8:10 AM EDT ----- Vitamin D is low.  Please advise patient to start vitamin D 50,000 units once a week for 3 months.  We will check vitamin D level in 3 months.  After she finishes the course of vitamin D, she should start on vitamin D 2000 units daily.

## 2022-01-28 ENCOUNTER — Other Ambulatory Visit (HOSPITAL_COMMUNITY): Payer: Self-pay

## 2022-01-28 LAB — VITAMIN D 25 HYDROXY (VIT D DEFICIENCY, FRACTURES): Vit D, 25-Hydroxy: 22 ng/mL — ABNORMAL LOW (ref 30–100)

## 2022-01-28 LAB — COMPLETE METABOLIC PANEL WITH GFR
AG Ratio: 1.4 (calc) (ref 1.0–2.5)
ALT: 8 U/L (ref 6–29)
AST: 14 U/L (ref 10–35)
Albumin: 3.7 g/dL (ref 3.6–5.1)
Alkaline phosphatase (APISO): 60 U/L (ref 37–153)
BUN: 16 mg/dL (ref 7–25)
CO2: 26 mmol/L (ref 20–32)
Calcium: 8.7 mg/dL (ref 8.6–10.4)
Chloride: 109 mmol/L (ref 98–110)
Creat: 0.69 mg/dL (ref 0.50–1.05)
Globulin: 2.6 g/dL (calc) (ref 1.9–3.7)
Glucose, Bld: 78 mg/dL (ref 65–99)
Potassium: 4.5 mmol/L (ref 3.5–5.3)
Sodium: 143 mmol/L (ref 135–146)
Total Bilirubin: 0.2 mg/dL (ref 0.2–1.2)
Total Protein: 6.3 g/dL (ref 6.1–8.1)
eGFR: 98 mL/min/{1.73_m2} (ref >=60)

## 2022-01-28 LAB — TISSUE TRANSGLUTAMINASE, IGA: (tTG) Ab, IgA: <1 U/mL

## 2022-01-28 LAB — PROTEIN ELECTROPHORESIS, SERUM, WITH REFLEX
Albumin ELP: 3.7 g/dL — ABNORMAL LOW (ref 3.8–4.8)
Alpha 1: 0.4 g/dL — ABNORMAL HIGH (ref 0.2–0.3)
Alpha 2: 0.9 g/dL (ref 0.5–0.9)
Beta 2: 0.4 g/dL (ref 0.2–0.5)
Beta Globulin: 0.4 g/dL (ref 0.4–0.6)
Gamma Globulin: 0.6 g/dL — ABNORMAL LOW (ref 0.8–1.7)
Total Protein: 6.4 g/dL (ref 6.1–8.1)

## 2022-01-28 LAB — GLIADIN ANTIBODIES, SERUM
Gliadin IgA: 1.9 U/mL
Gliadin IgG: <1 U/mL

## 2022-01-28 LAB — TSH: TSH: 1.41 mIU/L (ref 0.40–4.50)

## 2022-01-28 LAB — PHOSPHORUS: Phosphorus: 3.4 mg/dL (ref 2.5–4.5)

## 2022-01-28 LAB — PARATHYROID HORMONE, INTACT (NO CA): PTH: 57 pg/mL (ref 16–77)

## 2022-01-28 NOTE — Telephone Encounter (Signed)
Janet Randolph, We started the patient on vitamin D 50,000 units once a week.  Her vitamin D should continue to increase.  If the recommendations are to give Forteo with vitamin D over 20 then we should be able to start her on Forteo.  Please discuss with the patient about the necessity of taking vitamin D on a regular basis.  She should also start taking vitamin D 2000 units daily along with the prescription of vitamin D 50,000 units once a week so that there is no gap after she finishes 3 months course of vitamin D.  Thank you

## 2022-01-28 NOTE — Telephone Encounter (Signed)
Received notification from [facility] regarding a prior authorization for FORTEO. Authorization has been APPROVED from 01/26/22 to 01/27/23.   Unable to run test claim as patient must fill [facility]: [phone]  Authorization #  [id number]   Enrolled patient into Forteo copay card ID: [id number] BIN: [id number] PCN: [id number] Group: [id number]  Copay card may be used for up to 24 fills of FORTEO. For Insured/Covered Patients - Submit the co-pay authorized by the Patient's primary insurance as a secondary claim to [place] using BIN [id number] and using the Coordination of Benefits fields with Coverage Code type 08. This will reduce the eligible Patient's out-of-pocket costs to pay as little as $4, subject to monthly and annual savings caps for the program. Pharmacists with questions, please call the PBM at [phone]  Chesley Mires, PharmD, MPH, BCPS, CPP Clinical Pharmacist (Rheumatology and Pulmonology)

## 2022-01-29 MED ORDER — FORTEO 600 MCG/2.4ML ~~LOC~~ SOPN: 20.0000 ug | PEN_INJECTOR | Freq: Every day | SUBCUTANEOUS | 5 refills | Status: DC

## 2022-01-29 MED ORDER — INSULIN PEN NEEDLE 31G X 5 MM MISC: 2 refills | Status: DC

## 2022-01-29 NOTE — Telephone Encounter (Signed)
Called patient to review Forteo approval. Rx for Forteo (brand name) [facility].  Provided [facility] with [facility]. Provided her with copay card information. Advised her to call later today or tomorrow to schedule shipment. Reviewed importance of placing medication in refrigerator and ice packs in freezer.  [facility]  She would like to come to clinic to complete first dose together. She has been advised that she will need to transport Forteo with ice packs to clinic and transport back to home with ice packs to place in fridge. She verbalized understanding  She will reach back out to our clinic after her dental visit on 02/12/22 (she does not anticipate any major dental work needing to be completed that could delay initiation of Forteo) and after she has received medication.  Chesley Mires, PharmD, MPH, BCPS, CPP Clinical Pharmacist (Rheumatology and Pulmonology)

## 2022-02-12 ENCOUNTER — Ambulatory Visit: Payer: 59 | Admitting: Rheumatology

## 2022-02-14 NOTE — Telephone Encounter (Signed)
Attempted to contact patient regarding if she had received Forteo yet. However, patient did not answer and a HIPAA compliant voicemail was left requesting patient return the call or send a MyChart message.  Valeda Malm, Pharm.D. PGY-1 Pharmacy Resident 02/14/2022 11:34 AM

## 2022-02-15 ENCOUNTER — Telehealth: Payer: Self-pay | Admitting: Rheumatology

## 2022-02-15 ENCOUNTER — Telehealth: Payer: Self-pay | Admitting: Nurse Practitioner

## 2022-02-15 NOTE — Telephone Encounter (Signed)
Pt aware we can teach her / sch for today

## 2022-02-15 NOTE — Telephone Encounter (Signed)
Patient called stating she was returning Rachael's call.   

## 2022-02-15 NOTE — Telephone Encounter (Signed)
Returned call to patient regarding Forteo. Patient went to PCP's office today and administered medication with nurse. She tolerated without issue. No dizziness. States she feels tired but is not sure it is due to medication.  Chesley Mires, PharmD, MPH, BCPS, CPP Clinical Pharmacist (Rheumatology and Pulmonology)

## 2022-02-18 NOTE — Telephone Encounter (Signed)
Spoke with patient on Friday, 02/15/22. She started Forteo at PCP office that morning.  Chesley Mires, PharmD, MPH, BCPS, CPP Clinical Pharmacist (Rheumatology and Pulmonology)

## 2022-02-28 NOTE — Progress Notes (Signed)
Office Visit Note  Patient: Janet Randolph             Date of Birth: 03/15/1961           MRN: [id number]             PCP: Bennie Pierini, FNP Referring: Bennie Pierini, * Visit Date: 03/14/2022 Occupation: @GUAROCC @  Subjective:  Medication management  History of Present Illness: Janet Randolph is a 61 y.o. female with history of osteoporosis and osteoarthritis.  She started on Forteo injections on Jan 29, 2022.  She has been experiencing increased fatigue recently.  She has not experienced any side effects from Forteo injections.  She has been taking vitamin D supplement as her vitamin D was low.  She is currently on vitamin D 50,000 units once a week.  She is also taking calcium 600 mg p.o. daily.  She has been active but has not been exercising on a regular basis.  Activities of Daily Living:  Patient reports morning stiffness for a few minutes.   Patient Denies nocturnal pain.  Difficulty dressing/grooming: Denies Difficulty climbing stairs: Denies Difficulty getting out of chair: Denies Difficulty using hands for taps, buttons, cutlery, and/or writing: Denies  Review of Systems  Constitutional:  Positive for fatigue.  HENT:  Negative for mouth sores, mouth dryness and nose dryness.   Eyes:  Positive for dryness. Negative for pain and itching.  Respiratory:  Positive for shortness of breath. Negative for difficulty breathing.   Cardiovascular:  Negative for chest pain and palpitations.  Gastrointestinal:  Positive for diarrhea. Negative for blood in stool and constipation.  Endocrine: Negative for increased urination.  Genitourinary:  Negative for difficulty urinating.  Musculoskeletal:  Negative for joint pain, joint pain, joint swelling, myalgias, morning stiffness, muscle tenderness and myalgias.  Skin:  Negative for color change, rash, redness and sensitivity to sunlight.  Allergic/Immunologic: Positive for susceptible to infections.  Neurological:   Positive for dizziness and weakness. Negative for numbness, headaches and memory loss.  Hematological:  Positive for bruising/bleeding tendency.  Psychiatric/Behavioral:  Negative for confusion.     PMFS History:  Patient Active Problem List   Diagnosis Date Noted   GERD (gastroesophageal reflux disease) 04/03/2016   Eczema of both hands 07/12/2015   GAD (generalized anxiety disorder) 06/04/2013   Hyperlipidemia 03/05/2013   Hypertension 03/05/2013   Depression 02/09/2013    Past Medical History:  Diagnosis Date   Depression    Eczema    Hyperlipidemia    Hypertension    Vitamin D deficiency     Family History  Problem Relation Age of Onset   Melanoma Mother    Asthma Father    Crohn's disease Father    Melanoma Father    Asthma Brother    Heart Problems Brother    Stroke Brother    Healthy Son    Healthy Daughter    Past Surgical History:  Procedure Laterality Date   HAND SURGERY Bilateral    TEE WITHOUT CARDIOVERSION N/A 01/02/2022   Procedure: TRANSESOPHAGEAL ECHOCARDIOGRAM (TEE);  Surgeon: 01/04/2022, MD;  Location: [facility];  Service: Cardiovascular;  Laterality: N/A;   Social History   Social History Narrative   Not on file   Immunization History  Administered Date(s) Administered   PFIZER(Purple Top)SARS-COV-2 Vaccination 11/25/2019, 12/20/2019, 08/29/2020   Tdap 07/23/2010, 07/20/2021   Zoster Recombinat (Shingrix) 09/28/2021     Objective: Vital Signs: BP 125/79 (BP Location: Left Arm, Patient Position: Sitting, Cuff Size: Normal)  Pulse 66   Ht 5\' 5"  (1.651 m)   Wt 142 lb (64.4 kg)   BMI 23.63 kg/m    Physical Exam Vitals and nursing note reviewed.  Constitutional:      Appearance: She is well-developed.  HENT:     Head: Normocephalic and atraumatic.  Eyes:     Conjunctiva/sclera: Conjunctivae normal.  Cardiovascular:     Rate and Rhythm: Normal rate and regular rhythm.     Heart sounds: Normal heart sounds.  Pulmonary:      Effort: Pulmonary effort is normal.     Breath sounds: Normal breath sounds.  Abdominal:     General: Bowel sounds are normal.     Palpations: Abdomen is soft.  Musculoskeletal:     Cervical back: Normal range of motion.  Lymphadenopathy:     Cervical: No cervical adenopathy.  Skin:    General: Skin is warm and dry.     Capillary Refill: Capillary refill takes less than 2 seconds.  Neurological:     Mental Status: She is alert and oriented to person, place, and time.  Psychiatric:        Behavior: Behavior normal.      Musculoskeletal Exam: C-spine and second lumbar spine were in good range of motion.  Mild thoracic kyphosis was noted.  Shoulder joints, elbow joints, left wrist were in good range of motion.  Some limitation was noted in the right wrist joint range of motion.  She had bilateral PIP and DIP thickening with no synovitis.  Hip joints and knee joints in good range of motion.  She had no tenderness over ankles or MTPs.  CDAI Exam: CDAI Score: -- Patient Global: --; Provider Global: -- Swollen: --; Tender: -- Joint Exam 03/14/2022   No joint exam has been documented for this visit   There is currently no information documented on the homunculus. Go to the Rheumatology activity and complete the homunculus joint exam.  Investigation: No additional findings.  Imaging: No results found.  Recent Labs: Lab Results  Component Value Date   WBC 6.6 09/28/2021   HGB 12.3 09/28/2021   PLT 369 09/28/2021   NA 143 01/23/2022   K 4.5 01/23/2022   CL 109 01/23/2022   CO2 26 01/23/2022   GLUCOSE 78 01/23/2022   BUN 16 01/23/2022   CREATININE 0.69 01/23/2022   BILITOT 0.2 01/23/2022   ALKPHOS 72 09/28/2021   AST 14 01/23/2022   ALT 8 01/23/2022   PROT 6.3 01/23/2022   PROT 6.4 01/23/2022   ALBUMIN 4.0 09/28/2021   CALCIUM 8.7 01/23/2022   GFRAA 112 02/22/2020   01/23/2022 SPEP normal, anti-gliadin Ab negative, anti-TTG negative, TSH normal, PTH normal, Phosphorus  normal, Vitamin D 22   Speciality Comments: No specialty comments available.  Procedures:  No procedures performed Allergies: Latex and Penicillins   Assessment / Plan:     Visit Diagnoses: Other osteoporosis without current pathological fracture- DEXA 09/06/21:LFN BMD 0.491 T-score -3.2.  History of bilateral wrist joint fracture, history of reflux and she is a smoker.  After detailed counseling she was a started on Forteo injections Jan 29, 2022.  She has been tolerating injections well without any side effects.  She denies any difficulty self injecting.  She has been taking calcium and vitamin D.  She has not been exercising on a regular basis.  Need for regular exercise and resistive training was discussed.  Vitamin D deficiency-her vitamin D was 22 on Jan 23, 2022.  She is currently  on vitamin D 50,000 units once a week.  After finishing the course of vitamin D we will get repeat vitamin D level.  We will adjust the dose accordingly.  History of wrist fracture-she had left wrist fracture in 2020 and right wrist fracture in 2021 after a fall.  She has some limitation of range of motion in her right wrist joint.  She had surgery on bilateral wrist joints.  Primary osteoarthritis of both hands-she has bilateral PIP and DIP thickening.  Joint protection muscle strengthening was advised.  Other medical problems listed as follows:  Primary hypertension  Mixed hyperlipidemia  Murmur  History of emphysema  Smoker-smoking cessation was discussed.  History of gastroesophageal reflux (GERD)  History of colitis  Anxiety and depression  Eczema of both hands  Thyroid nodule  Orders: No orders of the defined types were placed in this encounter.  No orders of the defined types were placed in this encounter.    Follow-Up Instructions: Return in about 3 months (around 06/14/2022) for Osteoporosis, Osteoarthritis.   Pollyann Savoy, MD  Note - This record has been created using  Animal nutritionist.  Chart creation errors have been sought, but may not always  have been located. Such creation errors do not reflect on  the standard of medical care.

## 2022-03-14 ENCOUNTER — Ambulatory Visit: Payer: 59 | Admitting: Rheumatology

## 2022-03-14 ENCOUNTER — Encounter: Payer: Self-pay | Admitting: Rheumatology

## 2022-03-14 VITALS — BP 125/79 | HR 66 | Ht 65.0 in | Wt 142.0 lb

## 2022-03-14 DIAGNOSIS — M19041 Primary osteoarthritis, right hand: Secondary | ICD-10-CM | POA: Diagnosis not present

## 2022-03-14 DIAGNOSIS — E041 Nontoxic single thyroid nodule: Secondary | ICD-10-CM

## 2022-03-14 DIAGNOSIS — Z8781 Personal history of (healed) traumatic fracture: Secondary | ICD-10-CM

## 2022-03-14 DIAGNOSIS — M818 Other osteoporosis without current pathological fracture: Secondary | ICD-10-CM | POA: Diagnosis not present

## 2022-03-14 DIAGNOSIS — F32A Depression, unspecified: Secondary | ICD-10-CM

## 2022-03-14 DIAGNOSIS — F17200 Nicotine dependence, unspecified, uncomplicated: Secondary | ICD-10-CM

## 2022-03-14 DIAGNOSIS — R011 Cardiac murmur, unspecified: Secondary | ICD-10-CM

## 2022-03-14 DIAGNOSIS — E782 Mixed hyperlipidemia: Secondary | ICD-10-CM

## 2022-03-14 DIAGNOSIS — Z8709 Personal history of other diseases of the respiratory system: Secondary | ICD-10-CM

## 2022-03-14 DIAGNOSIS — L309 Dermatitis, unspecified: Secondary | ICD-10-CM

## 2022-03-14 DIAGNOSIS — I10 Essential (primary) hypertension: Secondary | ICD-10-CM

## 2022-03-14 DIAGNOSIS — J439 Emphysema, unspecified: Secondary | ICD-10-CM

## 2022-03-14 DIAGNOSIS — E559 Vitamin D deficiency, unspecified: Secondary | ICD-10-CM | POA: Diagnosis not present

## 2022-03-14 DIAGNOSIS — F419 Anxiety disorder, unspecified: Secondary | ICD-10-CM

## 2022-03-14 DIAGNOSIS — M19042 Primary osteoarthritis, left hand: Secondary | ICD-10-CM

## 2022-03-14 DIAGNOSIS — Z8719 Personal history of other diseases of the digestive system: Secondary | ICD-10-CM

## 2022-03-14 NOTE — Patient Instructions (Signed)
Once you have finished your prescription for vitamin D please come in the office for your blood work which will include vitamin D, CMP with GFR.  After you finish the course of vitamin D you will be taking over-the-counter vitamin D 2000 units daily.  Your total calcium intake between the diet and supplements should be 1200 mg a day.

## 2022-03-29 ENCOUNTER — Ambulatory Visit: Payer: 59 | Admitting: Nurse Practitioner

## 2022-03-29 ENCOUNTER — Encounter: Payer: Self-pay | Admitting: Nurse Practitioner

## 2022-03-29 VITALS — BP 131/74 | HR 70 | Temp 97.8°F | Resp 20 | Ht 65.0 in | Wt 140.0 lb

## 2022-03-29 DIAGNOSIS — K219 Gastro-esophageal reflux disease without esophagitis: Secondary | ICD-10-CM | POA: Diagnosis not present

## 2022-03-29 DIAGNOSIS — E782 Mixed hyperlipidemia: Secondary | ICD-10-CM | POA: Diagnosis not present

## 2022-03-29 DIAGNOSIS — F3342 Major depressive disorder, recurrent, in full remission: Secondary | ICD-10-CM | POA: Diagnosis not present

## 2022-03-29 DIAGNOSIS — I10 Essential (primary) hypertension: Secondary | ICD-10-CM

## 2022-03-29 DIAGNOSIS — F411 Generalized anxiety disorder: Secondary | ICD-10-CM

## 2022-03-29 MED ORDER — ALPRAZOLAM 0.5 MG PO TABS: 0.5000 mg | ORAL_TABLET | Freq: Two times a day (BID) | ORAL | 5 refills | Status: DC | PRN

## 2022-03-29 MED ORDER — ROSUVASTATIN CALCIUM 10 MG PO TABS: 10.0000 mg | ORAL_TABLET | Freq: Every day | ORAL | 1 refills | Status: DC

## 2022-03-29 MED ORDER — ESOMEPRAZOLE MAGNESIUM 40 MG PO CPDR: 40.0000 mg | DELAYED_RELEASE_CAPSULE | Freq: Every day | ORAL | 1 refills | Status: DC

## 2022-03-29 MED ORDER — VENLAFAXINE HCL ER 75 MG PO CP24: 150.0000 mg | ORAL_CAPSULE | Freq: Every day | ORAL | 1 refills | Status: DC

## 2022-03-29 MED ORDER — LISINOPRIL 20 MG PO TABS: 20.0000 mg | ORAL_TABLET | Freq: Every day | ORAL | 1 refills | Status: DC

## 2022-03-29 NOTE — Patient Instructions (Signed)

## 2022-03-29 NOTE — Progress Notes (Signed)
Subjective:    Patient ID: Janet Randolph, female    DOB: 1960-11-10, 61 y.o.   MRN: [id number]   Chief Complaint: medical management of chronic issues     HPI:  Janet Randolph is a 61 y.o. who identifies as a female who was assigned female at birth.   Social history: Lives with: husband Work history: works at [employer] in today for follow up of the following chronic medical issues:  1. Primary hypertension No c/o chest pain, sob or headache. Does not check blood pressure at home. BP Readings from Last 3 Encounters:  03/14/22 125/79  01/23/22 (!) 153/91  01/02/22 (!) 146/78     2. Mixed hyperlipidemia Does not watch diet and does not do much exercise. Lab Results  Component Value Date   CHOL 160 09/28/2021   HDL 56 09/28/2021   LDLCALC 87 09/28/2021   TRIG 91 09/28/2021   CHOLHDL 2.9 09/28/2021     3. Gastroesophageal reflux disease, unspecified whether esophagitis present Nexium daily helps keep symptoms under control.  4. Recurrent major depressive disorder, in full remission [facility]) Has been on effexor for several years. Has been doing well withou medication side effects.    03/29/2022    2:59 PM 09/28/2021    2:41 PM 03/26/2021    4:11 PM  Depression screen PHQ 2/9  Decreased Interest 2 2 0  Down, Depressed, Hopeless 1 2 1   PHQ - 2 Score 3 4 1   Altered sleeping 1 0 0  Tired, decreased energy 3 3 1   Change in appetite 3 2 0  Feeling bad or failure about yourself  1 2 0  Trouble concentrating 1 1 1   Moving slowly or fidgety/restless 0 0 0  Suicidal thoughts 0 0 0  PHQ-9 Score 12 12 3   Difficult doing work/chores Not difficult at all Not difficult at all Not difficult at all    5. GAD (generalized anxiety disorder) Effexor helps    03/29/2022    3:00 PM 09/28/2021    2:41 PM 03/26/2021    4:12 PM 09/26/2020   11:12 AM  GAD 7 : Generalized Anxiety Score  Nervous, Anxious, on Edge 3 3 1  0  Control/stop worrying 1 1 1  0  Worry too much -  different things 1 1 1  0  Trouble relaxing 1 0 1 0  Restless 1 0 0 0  Easily annoyed or irritable 1 2 0 1  Afraid - awful might happen 1 1 1  0  Total GAD 7 Score 9 8 5 1   Anxiety Difficulty Not difficult at all Somewhat difficult Not difficult at all Not difficult at all      New complaints: She had a trans echocardiogram in April which was normal, with ejection fractio of 65%  Allergies  Allergen Reactions   Latex Rash   Penicillins Rash   Outpatient Encounter Medications as of 03/29/2022  Medication Sig   ALPRAZolam (XANAX) 0.5 MG tablet Take 1 tablet (0.5 mg total) by mouth 2 (two) times daily as needed. (Patient taking differently: Take 0.5 mg by mouth 2 (two) times daily as needed for anxiety.)   Aspirin-Acetaminophen-Caffeine (GOODY HEADACHE PO) Take 3 Packages by mouth daily as needed (headache).   CALCIUM PO Take by mouth daily.   clobetasol ointment (TEMOVATE) 3.66 % Apply 1 application topically daily. Large surface area (Patient taking differently: Apply 1 application  topically daily as needed (dry skin). Large surface area)   Cyanocobalamin (VITAMIN B-12 PO)  Take by mouth daily.   desonide (DESOWEN) 0.05 % cream Apply topically 2 (two) times daily. (Patient taking differently: Apply 1 application  topically 2 (two) times daily as needed (rash).)   esomeprazole (NEXIUM) 40 MG capsule Take 1 capsule (40 mg total) by mouth daily. Take 30 min before meal   FORTEO 600 MCG/2.4ML SOPN Inject 20 mcg into the skin daily. DISCARD PEN AFTER 28 DAYS of use   Insulin Pen Needle 31G X 5 MM MISC Use to inject Forteo once daily. DO NOT REUSE NEEDLES.   lisinopril (ZESTRIL) 20 MG tablet Take 1 tablet (20 mg total) by mouth daily.   loperamide (IMODIUM) 2 MG capsule Take 4 mg by mouth daily as needed for diarrhea or loose stools.   Mesalamine (ASACOL) 400 MG CPDR DR capsule Take 400 mg by mouth 4 (four) times daily.   rosuvastatin (CRESTOR) 10 MG tablet Take 1 tablet (10 mg total) by mouth  daily.   venlafaxine XR (EFFEXOR-XR) 75 MG 24 hr capsule TABLET 1 OR 2 CAPSULES DAILY AS DIRECTED (Patient taking differently: 150 mg daily with breakfast.)   Vitamin D, Ergocalciferol, (DRISDOL) 1.25 MG (50000 UNIT) CAPS capsule Take 1 capsule (50,000 Units total) by mouth every 7 (seven) days.   No facility-administered encounter medications on file as of 03/29/2022.    Past Surgical History:  Procedure Laterality Date   HAND SURGERY Bilateral    TEE WITHOUT CARDIOVERSION N/A 01/02/2022   Procedure: TRANSESOPHAGEAL ECHOCARDIOGRAM (TEE);  Surgeon: Lelon Perla, MD;  Location: [facility] ENDOSCOPY;  Service: Cardiovascular;  Laterality: N/A;    Family History  Problem Relation Age of Onset   Melanoma Mother    Asthma Father    Crohn's disease Father    Melanoma Father    Asthma Brother    Heart Problems Brother    Stroke Brother    Healthy Son    Healthy Daughter       Controlled substance contract: n/a      Review of Systems  Constitutional:  Negative for diaphoresis.  Eyes:  Negative for pain.  Respiratory:  Negative for shortness of breath.   Cardiovascular:  Negative for chest pain, palpitations and leg swelling.  Gastrointestinal:  Negative for abdominal pain.  Endocrine: Negative for polydipsia.  Skin:  Negative for rash.  Neurological:  Negative for dizziness, weakness and headaches.  Hematological:  Does not bruise/bleed easily.  All other systems reviewed and are negative.      Objective:   Physical Exam Vitals and nursing note reviewed.  Constitutional:      General: She is not in acute distress.    Appearance: Normal appearance. She is well-developed.  HENT:     Head: Normocephalic.     Right Ear: Tympanic membrane normal.     Left Ear: Tympanic membrane normal.     Nose: Nose normal.     Mouth/Throat:     Mouth: Mucous membranes are moist.  Eyes:     Pupils: Pupils are equal, round, and reactive to light.  Neck:     Vascular: No carotid bruit  or JVD.  Cardiovascular:     Rate and Rhythm: Normal rate and regular rhythm.     Heart sounds: Normal heart sounds.  Pulmonary:     Effort: Pulmonary effort is normal. No respiratory distress.     Breath sounds: Normal breath sounds. No wheezing or rales.  Chest:     Chest wall: No tenderness.  Abdominal:     General: Bowel  sounds are normal. There is no distension or abdominal bruit.     Palpations: Abdomen is soft. There is no hepatomegaly, splenomegaly, mass or pulsatile mass.     Tenderness: There is no abdominal tenderness.  Musculoskeletal:        General: Normal range of motion.     Cervical back: Normal range of motion and neck supple.  Lymphadenopathy:     Cervical: No cervical adenopathy.  Skin:    General: Skin is warm and dry.  Neurological:     Mental Status: She is alert and oriented to person, place, and time.     Deep Tendon Reflexes: Reflexes are normal and symmetric.  Psychiatric:        Behavior: Behavior normal.        Thought Content: Thought content normal.        Judgment: Judgment normal.     BP 131/74   Pulse 70   Temp 97.8 F (36.6 C) (Temporal)   Resp 20   Ht 5' 5"  (1.651 m)   Wt 140 lb (63.5 kg)   SpO2 96%   BMI 23.30 kg/m        Assessment & Plan:   Janet Randolph comes in today with chief complaint of Medical Management of Chronic Issues   Diagnosis and orders addressed:  1. Primary hypertension Low sodium diet - lisinopril (ZESTRIL) 20 MG tablet; Take 1 tablet (20 mg total) by mouth daily.  Dispense: 90 tablet; Refill: 1 - CBC with Differential/Platelet - CMP14+EGFR  2. Mixed hyperlipidemia Low fat diet - rosuvastatin (CRESTOR) 10 MG tablet; Take 1 tablet (10 mg total) by mouth daily.  Dispense: 90 tablet; Refill: 1 - Lipid panel  3. Gastroesophageal reflux disease, unspecified whether esophagitis present Avoid spicy foods Do not eat 2 hours prior to bedtime - esomeprazole (NEXIUM) 40 MG capsule; Take 1 capsule (40 mg  total) by mouth daily. Take 30 min before meal  Dispense: 90 capsule; Refill: 1  4. Recurrent major depressive disorder, in full remission ([place]) Stress management - venlafaxine XR (EFFEXOR-XR) 75 MG 24 hr capsule; Take 2 capsules (150 mg total) by mouth daily with breakfast.  Dispense: 90 capsule; Refill: 1  5. GAD (generalized anxiety disorder) - ALPRAZolam (XANAX) 0.5 MG tablet; Take 1 tablet (0.5 mg total) by mouth 2 (two) times daily as needed.  Dispense: 60 tablet; Refill: 5   Labs pending Health Maintenance reviewed Diet and exercise encouraged  Follow up plan: 6 months   Mary-Margaret Hassell Done, FNP

## 2022-03-30 LAB — CMP14+EGFR
ALT: 14 IU/L (ref 0–32)
AST: 18 IU/L (ref 0–40)
Albumin/Globulin Ratio: 2 (ref 1.2–2.2)
Albumin: 4.1 g/dL (ref 3.9–4.9)
Alkaline Phosphatase: 59 IU/L (ref 44–121)
BUN/Creatinine Ratio: 26 (ref 12–28)
BUN: 19 mg/dL (ref 8–27)
Bilirubin Total: <0.2 mg/dL (ref 0.0–1.2)
CO2: 22 mmol/L (ref 20–29)
Calcium: 9 mg/dL (ref 8.7–10.3)
Chloride: 108 mmol/L — ABNORMAL HIGH (ref 96–106)
Creatinine, Ser: 0.74 mg/dL (ref 0.57–1.00)
Globulin, Total: 2.1 g/dL (ref 1.5–4.5)
Glucose: 110 mg/dL — ABNORMAL HIGH (ref 70–99)
Potassium: 4.3 mmol/L (ref 3.5–5.2)
Sodium: 145 mmol/L — ABNORMAL HIGH (ref 134–144)
Total Protein: 6.2 g/dL (ref 6.0–8.5)
eGFR: 91 mL/min/{1.73_m2} (ref >59)

## 2022-03-30 LAB — CBC WITH DIFFERENTIAL/PLATELET
Basophils Absolute: 0 10*3/uL (ref 0.0–0.2)
Basos: 1 %
EOS (ABSOLUTE): 0.2 10*3/uL (ref 0.0–0.4)
Eos: 2 %
Hematocrit: 35.5 % (ref 34.0–46.6)
Hemoglobin: 11.8 g/dL (ref 11.1–15.9)
Immature Grans (Abs): 0.1 10*3/uL (ref 0.0–0.1)
Immature Granulocytes: 1 %
Lymphocytes Absolute: 2 10*3/uL (ref 0.7–3.1)
Lymphs: 31 %
MCH: 32 pg (ref 26.6–33.0)
MCHC: 33.2 g/dL (ref 31.5–35.7)
MCV: 96 fL (ref 79–97)
Monocytes Absolute: 0.5 10*3/uL (ref 0.1–0.9)
Monocytes: 7 %
Neutrophils Absolute: 3.9 10*3/uL (ref 1.4–7.0)
Neutrophils: 58 %
Platelets: 383 10*3/uL (ref 150–450)
RBC: 3.69 x10E6/uL — ABNORMAL LOW (ref 3.77–5.28)
RDW: 12.9 % (ref 11.7–15.4)
WBC: 6.6 10*3/uL (ref 3.4–10.8)

## 2022-03-30 LAB — LIPID PANEL
Chol/HDL Ratio: 3.1 ratio (ref 0.0–4.4)
Cholesterol, Total: 157 mg/dL (ref 100–199)
HDL: 51 mg/dL (ref >39)
LDL Chol Calc (NIH): 87 mg/dL (ref 0–99)
Triglycerides: 105 mg/dL (ref 0–149)
VLDL Cholesterol Cal: 19 mg/dL (ref 5–40)

## 2022-04-08 ENCOUNTER — Other Ambulatory Visit: Payer: Self-pay | Admitting: Nurse Practitioner

## 2022-04-08 DIAGNOSIS — F411 Generalized anxiety disorder: Secondary | ICD-10-CM

## 2022-06-03 NOTE — Progress Notes (Signed)
Office Visit Note  Patient: Janet Randolph             Date of Birth: 1961/09/05           MRN: [id number]             PCP: Chevis Pretty, FNP Referring: Chevis Pretty, * Visit Date: 06/17/2022 Occupation: @GUAROCC @  Subjective:  Medication monitoring   History of Present Illness: Janet Randolph is a 61 y.o. female with history of osteoporosis and osteoarthritis.  She is currently on foteo daily injections.  Forteo was initiated on 01/29/2022.  She has been tolerating Forteo without any side effects or injection site reactions.  She completed the course of vitamin D 50,000 units once weekly x3 months and has been taking a maintenance dose of vitamin D 1000 units daily and calcium 600 mg daily.  She denies any recent falls or fractures.  She has been using 2 pound weights for resistive strengthening exercise.  She denies any increased joint pain or joint swelling.  She denies any recurrent infections.  She denies any new medical conditions.    Activities of Daily Living:  Patient reports morning stiffness for 0 minutes.   Patient Denies nocturnal pain.  Difficulty dressing/grooming: Denies Difficulty climbing stairs: Denies Difficulty getting out of chair: Denies Difficulty using hands for taps, buttons, cutlery, and/or writing: Denies  Review of Systems  Constitutional:  Positive for fatigue.  HENT:  Positive for mouth dryness. Negative for mouth sores.   Eyes:  Positive for dryness.  Respiratory:  Positive for shortness of breath.   Cardiovascular:  Negative for chest pain and palpitations.  Gastrointestinal:  Positive for diarrhea. Negative for blood in stool and constipation.  Endocrine: Negative for increased urination.  Genitourinary:  Negative for involuntary urination.  Musculoskeletal:  Positive for joint pain and joint pain. Negative for gait problem, joint swelling, myalgias, muscle weakness, morning stiffness, muscle tenderness and myalgias.  Skin:   Positive for sensitivity to sunlight. Negative for color change, rash and hair loss.  Allergic/Immunologic: Negative for susceptible to infections.  Neurological:  Positive for headaches. Negative for dizziness.  Hematological:  Negative for swollen glands.  Psychiatric/Behavioral:  Positive for depressed mood. Negative for sleep disturbance. The patient is nervous/anxious.     PMFS History:  Patient Active Problem List   Diagnosis Date Noted   GERD (gastroesophageal reflux disease) 04/03/2016   Eczema of both hands 07/12/2015   GAD (generalized anxiety disorder) 06/04/2013   Hyperlipidemia 03/05/2013   Hypertension 03/05/2013   Depression 02/09/2013    Past Medical History:  Diagnosis Date   Depression    Eczema    Hyperlipidemia    Hypertension    Vitamin D deficiency     Family History  Problem Relation Age of Onset   Melanoma Mother    Asthma Father    Crohn's disease Father    Melanoma Father    Asthma Brother    Heart Problems Brother    Stroke Brother    Healthy Son    Healthy Daughter    Past Surgical History:  Procedure Laterality Date   HAND SURGERY Bilateral    MOLE REMOVAL  06/12/2022   TEE WITHOUT CARDIOVERSION N/A 01/02/2022   Procedure: TRANSESOPHAGEAL ECHOCARDIOGRAM (TEE);  Surgeon: Lelon Perla, MD;  Location: [facility] ENDOSCOPY;  Service: Cardiovascular;  Laterality: N/A;   Social History   Social History Narrative   Not on file   Immunization History  Administered Date(s) Administered   PFIZER(Purple  Top)SARS-COV-2 Vaccination 11/25/2019, 12/20/2019, 08/29/2020   Tdap 07/23/2010, 07/20/2021   Zoster Recombinat (Shingrix) 09/28/2021     Objective: Vital Signs: BP 126/78 (BP Location: Left Arm, Patient Position: Sitting, Cuff Size: Normal)   Pulse 73   Resp 18   Ht 5\' 5"  (1.651 m)   Wt 139 lb 12.8 oz (63.4 kg)   BMI 23.26 kg/m    Physical Exam Vitals and nursing note reviewed.  Constitutional:      Appearance: She is well-developed.   HENT:     Head: Normocephalic and atraumatic.  Eyes:     Conjunctiva/sclera: Conjunctivae normal.  Cardiovascular:     Rate and Rhythm: Normal rate and regular rhythm.     Heart sounds: Normal heart sounds.  Pulmonary:     Effort: Pulmonary effort is normal.     Breath sounds: Normal breath sounds.  Abdominal:     General: Bowel sounds are normal.     Palpations: Abdomen is soft.  Musculoskeletal:     Cervical back: Normal range of motion.  Skin:    General: Skin is warm and dry.     Capillary Refill: Capillary refill takes less than 2 seconds.  Neurological:     Mental Status: She is alert and oriented to person, place, and time.  Psychiatric:        Behavior: Behavior normal.      Musculoskeletal Exam: C-spine, thoracic spine, and lumbar spine good ROM.  No midline spinal tenderness.  No SI joint tenderness.  Shoulder joints, elbow joints, wrist joints, MCPs, PIPs, DIPs have good range of motion with no synovitis.  Complete fist formation bilaterally.  Mild PIP and DIP prominence consistent with osteoarthritic changes of both hands.  Hip joints have good range of motion with no groin pain.  Knee joints have good range of motion with no warmth or effusion.  Ankle joints have good range of motion with no tenderness or joint swelling.  No tenderness or synovitis over MTP joints.  CDAI Exam: CDAI Score: -- Patient Global: --; Provider Global: -- Swollen: --; Tender: -- Joint Exam 06/17/2022   No joint exam has been documented for this visit   There is currently no information documented on the homunculus. Go to the Rheumatology activity and complete the homunculus joint exam.  Investigation: No additional findings.  Imaging: No results found.  Recent Labs: Lab Results  Component Value Date   WBC 6.6 03/29/2022   HGB 11.8 03/29/2022   PLT 383 03/29/2022   NA 145 (H) 03/29/2022   K 4.3 03/29/2022   CL 108 (H) 03/29/2022   CO2 22 03/29/2022   GLUCOSE 110 (H)  03/29/2022   BUN 19 03/29/2022   CREATININE 0.74 03/29/2022   BILITOT <0.2 03/29/2022   ALKPHOS 59 03/29/2022   AST 18 03/29/2022   ALT 14 03/29/2022   PROT 6.2 03/29/2022   ALBUMIN 4.1 03/29/2022   CALCIUM 9.0 03/29/2022   GFRAA 112 02/22/2020    Speciality Comments: Forteo Jan 29, 2022  Procedures:  No procedures performed Allergies: Latex and Penicillins   Assessment / Plan:     Visit Diagnoses: Other osteoporosis without current pathological fracture -  DEXA 09/06/21:LFN BMD 0.491 T-score -3.2.  History of bilateral wrist joint fracture, history of reflux and she is a smoker.  Patient was initiated on Forteo on 01/29/2022.  She has been tolerating Forteo without any side effects or injection site reactions.  She is currently taking vitamin D 1000 units daily and calcium 600  mg daily.  She recently completed the course of vitamin D 50,000 units once weekly for 3 months.  Vitamin D level will be rechecked today along with CBC and CMP.  She has not had any recent falls or fractures.  Discussed the importance of resistive and weightbearing exercises. Patient was encouraged to remain on Forteo as prescribed.  She was advised to notify us if she develops any new or worsening symptoms.  She will follow-up in the office in 6 months or sooner if needed.   - Plan: VITAMIN D 25 Hydroxy (Vit-D Deficiency, Fractures)  Medication monitoring encounter -CBC, CMP, vitamin D will be checked today.  Plan: VITAMIN D 25 Hydroxy (Vit-D Deficiency, Fractures), COMPLETE METABOLIC PANEL WITH GFR, CBC with Differential/Platelet  Vitamin D deficiency -She completed the course of vitamin D 50,000 units once weekly x3 months and has been taking a maintenance dose of vitamin D 1000 units daily.  Vitamin D level will be checked today.  Plan: VITAMIN D 25 Hydroxy (Vit-D Deficiency, Fractures)  History of wrist fracture -Left wrist fracture in 2020 and right wrist fracture in 2021 after a fall.  Slightly limited range  of motion of the right wrist joint but no tenderness or synovitis noted.  Primary osteoarthritis of both hands: Mild PIP and DIP thickening consistent with osteoarthritis of both hands.  No tenderness or inflammation noted.  Complete fist formation bilaterally.  Discussed the importance of joint protection and muscle strengthening.  Discussed the importance of performing hand exercises daily.  Other medical conditions are listed as follows:  Primary hypertension: Blood pressure was 126/78 today in the office.  Mixed hyperlipidemia  History of emphysema (HCC)  Murmur  History of gastroesophageal reflux (GERD)  History of colitis: Advised to follow-up with gastroenterology.  Anxiety and depression  Eczema of both hands  Thyroid nodule  Smoker   Orders: Orders Placed This Encounter  Procedures   VITAMIN D 25 Hydroxy (Vit-D Deficiency, Fractures)   COMPLETE METABOLIC PANEL WITH GFR   CBC with Differential/Platelet   No orders of the defined types were placed in this encounter.    Follow-Up Instructions: Return in about 6 months (around 12/17/2022) for Osteoporosis, Osteoarthritis.   Gearldine Bienenstock, PA-C  Note - This record has been created using Dragon software.  Chart creation errors have been sought, but may not always  have been located. Such creation errors do not reflect on  the standard of medical care.

## 2022-06-12 HISTORY — PX: MOLE REMOVAL: SHX2046

## 2022-06-17 ENCOUNTER — Ambulatory Visit: Payer: 59 | Attending: Physician Assistant | Admitting: Physician Assistant

## 2022-06-17 ENCOUNTER — Encounter: Payer: Self-pay | Admitting: Physician Assistant

## 2022-06-17 VITALS — BP 126/78 | HR 73 | Resp 18 | Ht 65.0 in | Wt 139.8 lb

## 2022-06-17 DIAGNOSIS — Z5181 Encounter for therapeutic drug level monitoring: Secondary | ICD-10-CM

## 2022-06-17 DIAGNOSIS — F419 Anxiety disorder, unspecified: Secondary | ICD-10-CM

## 2022-06-17 DIAGNOSIS — M19041 Primary osteoarthritis, right hand: Secondary | ICD-10-CM | POA: Diagnosis not present

## 2022-06-17 DIAGNOSIS — J439 Emphysema, unspecified: Secondary | ICD-10-CM

## 2022-06-17 DIAGNOSIS — Z8709 Personal history of other diseases of the respiratory system: Secondary | ICD-10-CM

## 2022-06-17 DIAGNOSIS — E782 Mixed hyperlipidemia: Secondary | ICD-10-CM

## 2022-06-17 DIAGNOSIS — E559 Vitamin D deficiency, unspecified: Secondary | ICD-10-CM | POA: Diagnosis not present

## 2022-06-17 DIAGNOSIS — Z8781 Personal history of (healed) traumatic fracture: Secondary | ICD-10-CM | POA: Diagnosis not present

## 2022-06-17 DIAGNOSIS — I10 Essential (primary) hypertension: Secondary | ICD-10-CM

## 2022-06-17 DIAGNOSIS — F17200 Nicotine dependence, unspecified, uncomplicated: Secondary | ICD-10-CM

## 2022-06-17 DIAGNOSIS — R011 Cardiac murmur, unspecified: Secondary | ICD-10-CM

## 2022-06-17 DIAGNOSIS — M818 Other osteoporosis without current pathological fracture: Secondary | ICD-10-CM | POA: Diagnosis not present

## 2022-06-17 DIAGNOSIS — F32A Depression, unspecified: Secondary | ICD-10-CM

## 2022-06-17 DIAGNOSIS — E041 Nontoxic single thyroid nodule: Secondary | ICD-10-CM

## 2022-06-17 DIAGNOSIS — L309 Dermatitis, unspecified: Secondary | ICD-10-CM

## 2022-06-17 DIAGNOSIS — M19042 Primary osteoarthritis, left hand: Secondary | ICD-10-CM

## 2022-06-17 DIAGNOSIS — Z8719 Personal history of other diseases of the digestive system: Secondary | ICD-10-CM

## 2022-06-18 LAB — CBC WITH DIFFERENTIAL/PLATELET
Absolute Monocytes: 446 cells/uL (ref 200–950)
Basophils Absolute: 50 cells/uL (ref 0–200)
Basophils Relative: 0.9 %
Eosinophils Absolute: 286 cells/uL (ref 15–500)
Eosinophils Relative: 5.2 %
HCT: 33.8 % — ABNORMAL LOW (ref 35.0–45.0)
Hemoglobin: 11.5 g/dL — ABNORMAL LOW (ref 11.7–15.5)
Lymphs Abs: 2118 cells/uL (ref 850–3900)
MCH: 33.2 pg — ABNORMAL HIGH (ref 27.0–33.0)
MCHC: 34 g/dL (ref 32.0–36.0)
MCV: 97.7 fL (ref 80.0–100.0)
MPV: 9.4 fL (ref 7.5–12.5)
Monocytes Relative: 8.1 %
Neutro Abs: 2602 cells/uL (ref 1500–7800)
Neutrophils Relative %: 47.3 %
Platelets: 337 10*3/uL (ref 140–400)
RBC: 3.46 10*6/uL — ABNORMAL LOW (ref 3.80–5.10)
RDW: 12.5 % (ref 11.0–15.0)
Total Lymphocyte: 38.5 %
WBC: 5.5 10*3/uL (ref 3.8–10.8)

## 2022-06-18 LAB — COMPLETE METABOLIC PANEL WITH GFR
AG Ratio: 1.5 (calc) (ref 1.0–2.5)
ALT: 8 U/L (ref 6–29)
AST: 14 U/L (ref 10–35)
Albumin: 3.8 g/dL (ref 3.6–5.1)
Alkaline phosphatase (APISO): 53 U/L (ref 37–153)
BUN: 19 mg/dL (ref 7–25)
CO2: 24 mmol/L (ref 20–32)
Calcium: 8.4 mg/dL — ABNORMAL LOW (ref 8.6–10.4)
Chloride: 110 mmol/L (ref 98–110)
Creat: 0.7 mg/dL (ref 0.50–1.05)
Globulin: 2.5 g/dL (calc) (ref 1.9–3.7)
Glucose, Bld: 75 mg/dL (ref 65–99)
Potassium: 3.9 mmol/L (ref 3.5–5.3)
Sodium: 142 mmol/L (ref 135–146)
Total Bilirubin: 0.2 mg/dL (ref 0.2–1.2)
Total Protein: 6.3 g/dL (ref 6.1–8.1)
eGFR: 97 mL/min/{1.73_m2} (ref >=60)

## 2022-06-18 LAB — VITAMIN D 25 HYDROXY (VIT D DEFICIENCY, FRACTURES): Vit D, 25-Hydroxy: 40 ng/mL (ref 30–100)

## 2022-06-18 NOTE — Progress Notes (Signed)
Vitamin D has returned to WNL.  Please advise patient to continue on maintenance dose of vitamin D daily.   RBC count, hgb, and hct are low.  Please advise patient to follow up with PCP for anemia workup.   Calcium is low-8.4.Increase calcium supplementation to 600 mg twice daily.

## 2022-08-19 ENCOUNTER — Telehealth: Payer: Self-pay | Admitting: Rheumatology

## 2022-08-19 DIAGNOSIS — Z8781 Personal history of (healed) traumatic fracture: Secondary | ICD-10-CM

## 2022-08-19 DIAGNOSIS — M818 Other osteoporosis without current pathological fracture: Secondary | ICD-10-CM

## 2022-08-19 NOTE — Telephone Encounter (Signed)
[name], a pharmacist from [facility] left a voicemail regarding a prescription they received for Janet Randolph. [name] would like to know if the patient requires brand name or can receive the generic. [phone] ext [phone]

## 2022-08-19 NOTE — Telephone Encounter (Signed)
Please clarify

## 2022-08-19 NOTE — Telephone Encounter (Signed)
Does patient require brand name forteo or can generic be dispensed? Thanks!

## 2022-08-23 ENCOUNTER — Other Ambulatory Visit: Payer: Self-pay | Admitting: Nurse Practitioner

## 2022-08-23 DIAGNOSIS — F3342 Major depressive disorder, recurrent, in full remission: Secondary | ICD-10-CM

## 2022-09-06 ENCOUNTER — Other Ambulatory Visit (HOSPITAL_COMMUNITY): Payer: Self-pay

## 2022-09-06 MED ORDER — TERIPARATIDE 600 MCG/2.4ML ~~LOC~~ SOPN: 20.0000 ug | PEN_INJECTOR | Freq: Every day | SUBCUTANEOUS | 5 refills | Status: DC

## 2022-09-06 NOTE — Addendum Note (Signed)
Addended by: Murrell Redden on: 09/06/2022 01:37 PM   Modules accepted: Orders

## 2022-09-06 NOTE — Telephone Encounter (Signed)
[facility] only dispenses one generic and that copay   Spoke with Kandee Keen, Rph, to provide verbal for generic teriparatide. They dispense [employer] manufacturer only.  The copay card up to a maximum of $250 per month if eligible Copay card:  BIN# [id number] PCN# [id number] GRP# [id number] ID# [id number]  Last filled on 06/21/2022.  ATC patient to determine why she has been off of Forteo for ~1 month. Unable to reach. Left VM and sent MyChart message with copay card.  Chesley Mires, PharmD, MPH, BCPS, CPP Clinical Pharmacist (Rheumatology and Pulmonology)

## 2022-09-09 DIAGNOSIS — M4800 Spinal stenosis, site unspecified: Secondary | ICD-10-CM

## 2022-09-09 HISTORY — DX: Spinal stenosis, site unspecified: M48.00

## 2022-09-11 IMAGING — CT CT CHEST LUNG CANCER SCREENING LOW DOSE W/O CM
2 of 5 series · 15 of 40 positions shown, 18 images · non-contrast
Comparison: 07/22/2014 chest CT.

CLINICAL DATA: 60-year-old asymptomatic female current smoker with
30 pack-year smoking history.

EXAM:
CT CHEST WITHOUT CONTRAST LOW-DOSE FOR LUNG CANCER SCREENING
TECHNIQUE: Multidetector CT imaging of the chest was performed following the
standard protocol without IV contrast.

[Series 4: lung 1.00 br44 cor · coronal · 0.63mm/px · 3 of 286 slices shown]
[im 58/286  lung]
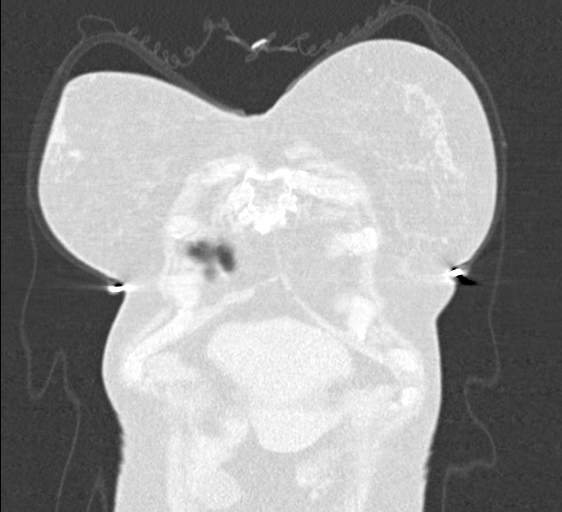
[im 115/286  lung]
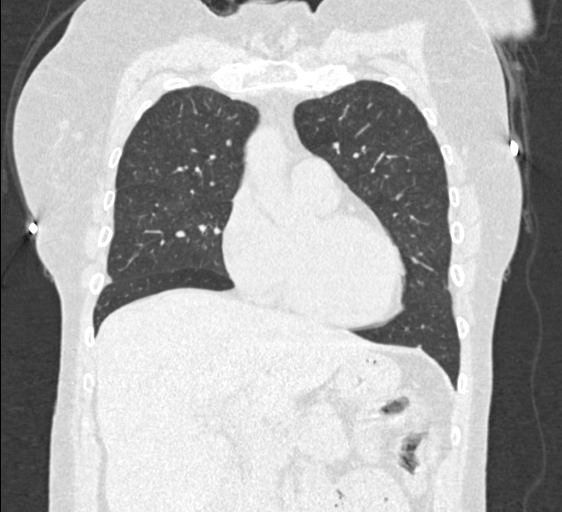
[im 172/286  lung]
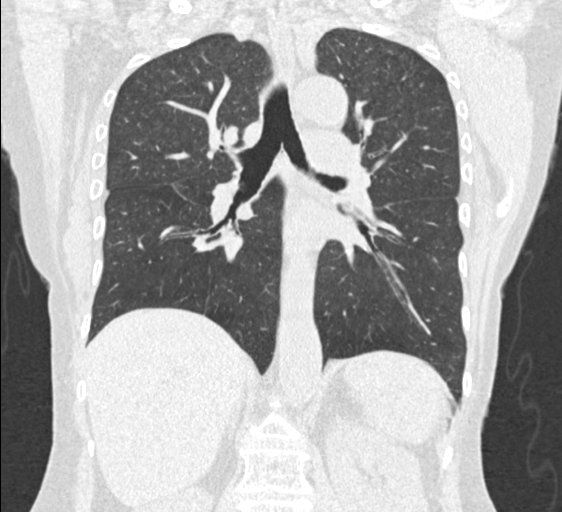

[Series 9: lung 1.00 br60 axial · axial · 0.69mm/px · z∈[-1140,-849]mm · 12 of 321 slices shown, 15 images]
[im 15/321  mediastinal]
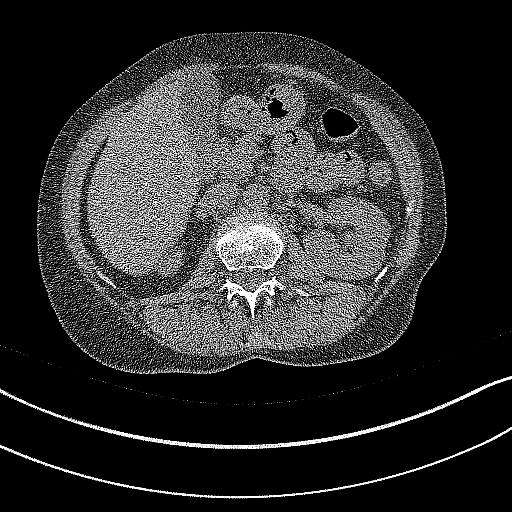
[im 15/321  lung]
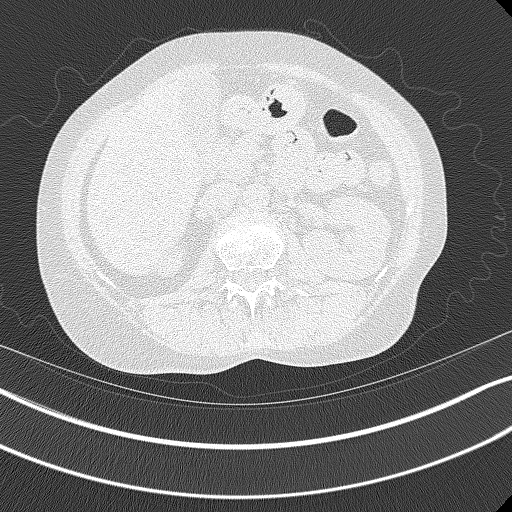
[im 44/321  lung]
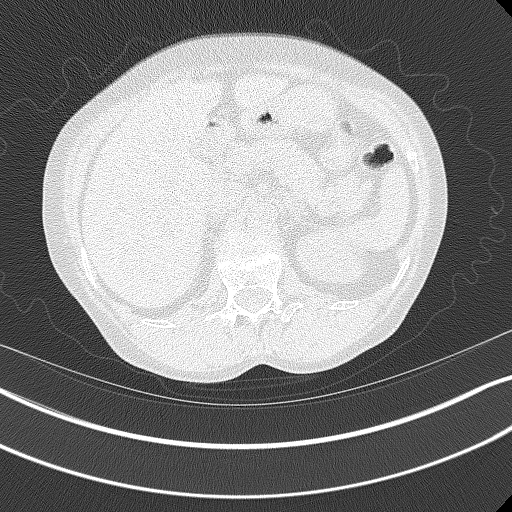
[im 73/321  lung]
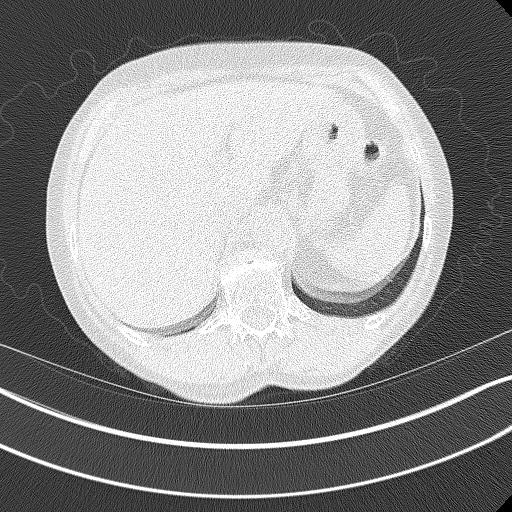
[im 102/321  lung]
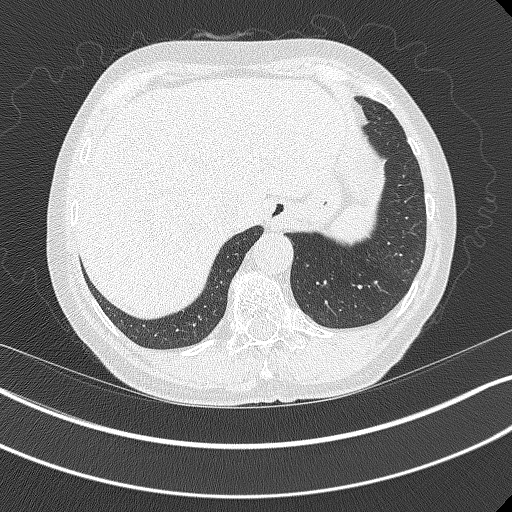
[im 117/321  mediastinal]
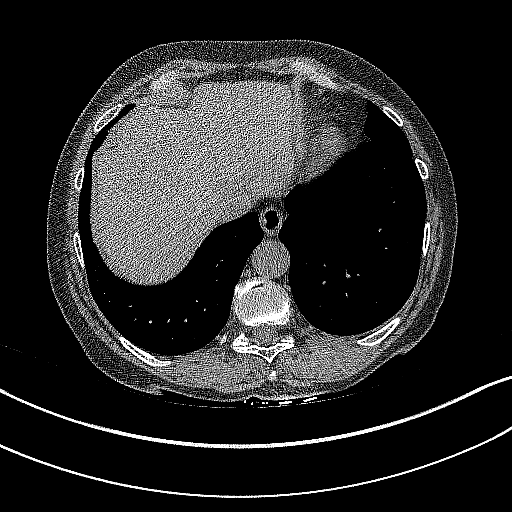
[im 117/321  lung]
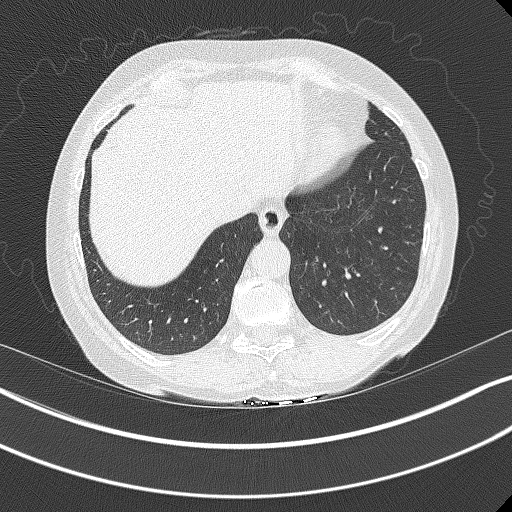
[im 146/321  lung]
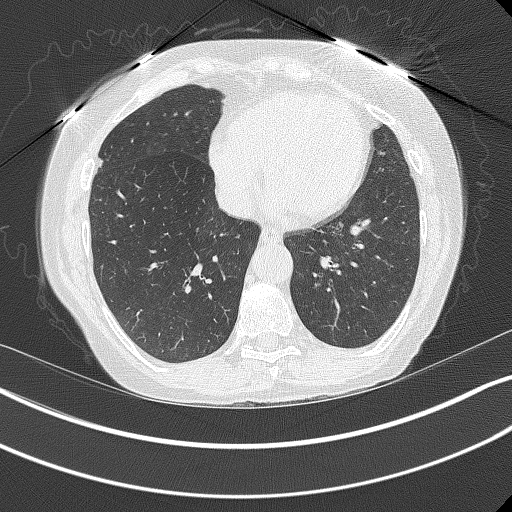
[im 175/321  lung]
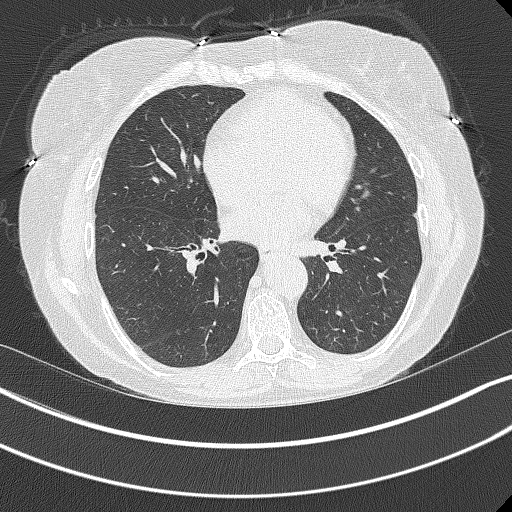
[im 204/321  lung]
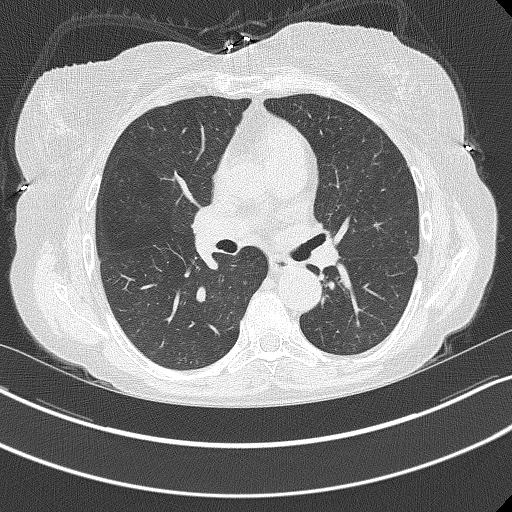
[im 219/321  mediastinal]
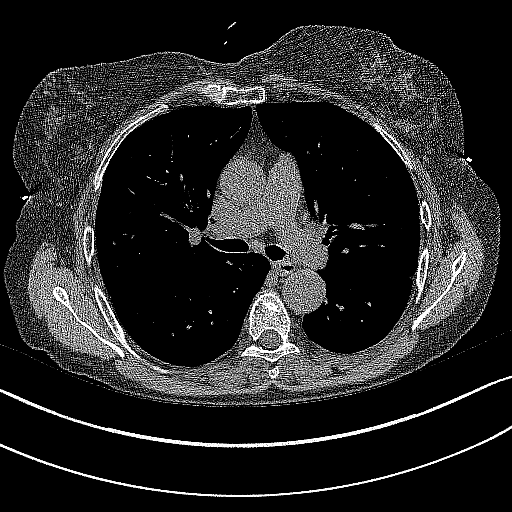
[im 219/321  lung]
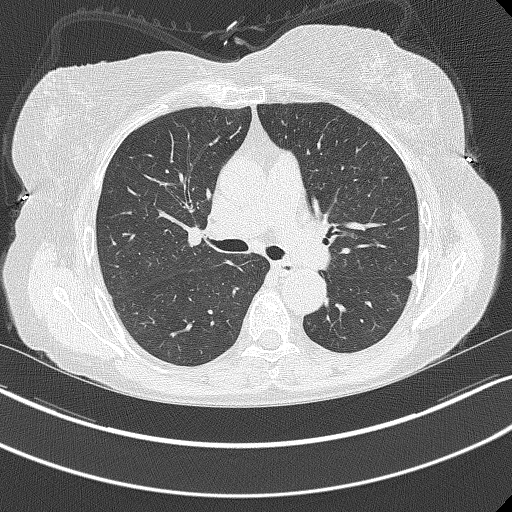
[im 248/321  lung]
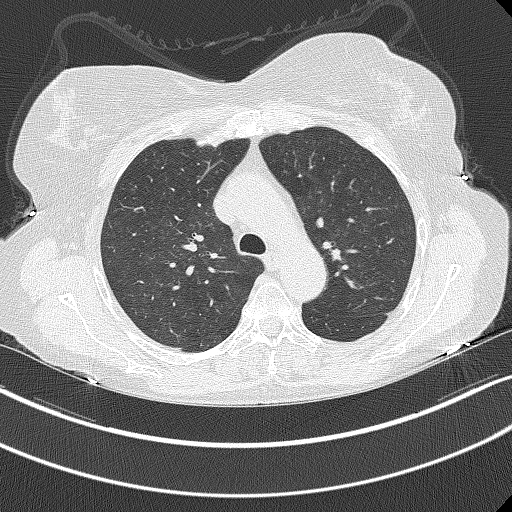
[im 277/321  lung]
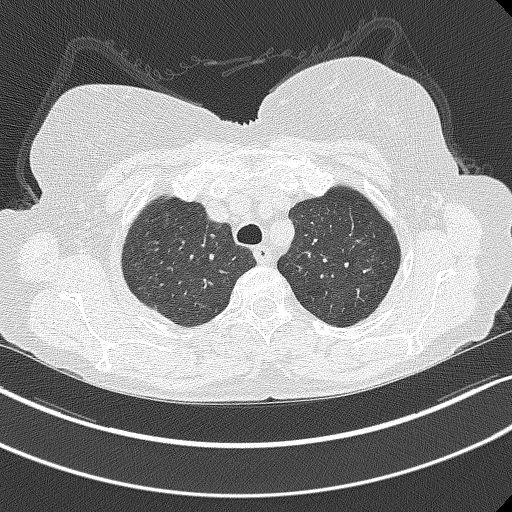
[im 306/321  lung]
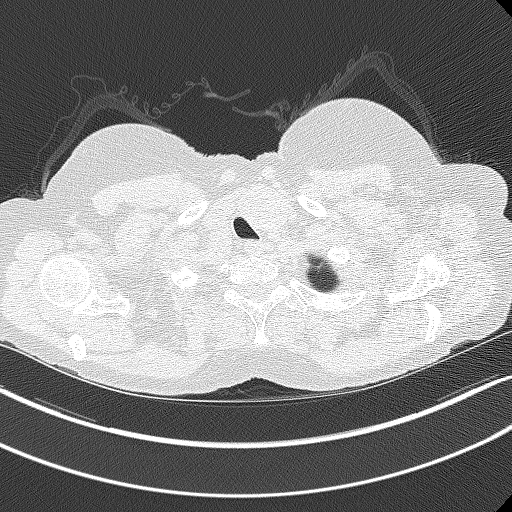

[15 of 40 positions shown; findings below may reference images not displayed]

FINDINGS: Cardiovascular: Normal heart size. No significant pericardial
effusion/thickening. Mildly atherosclerotic nonaneurysmal thoracic
aorta. Normal caliber pulmonary arteries.

Mediastinum/Nodes: Heterogeneous hypodense 3.0 cm left thyroid
nodule, unchanged. This has been evaluated on previous imaging.
(ref: [HOSPITAL]. [DATE]): 143-50). Unremarkable
esophagus. No pathologically enlarged axillary, mediastinal or hilar
lymph nodes, noting limited sensitivity for the detection of hilar
adenopathy on this noncontrast study.

Lungs/Pleura: No pneumothorax. No pleural effusion. Mild
centrilobular emphysema with diffuse bronchial wall thickening. No
acute consolidative airspace disease or lung masses. No significant
pulmonary nodules.

Upper abdomen: Stable left adrenal 1.7 cm nodule with density -14
HU, compatible with an adenoma.

Musculoskeletal: No aggressive appearing focal osseous lesions.
Moderate thoracic spondylosis.
IMPRESSION: 1. Lung-RADS 1, negative. Continue annual screening with low-dose
chest CT without contrast in 12 months.
2. Stable left adrenal adenoma.
3. Aortic Atherosclerosis (L2ROQ-TCS.S) and Emphysema (L2ROQ-FK5.U).

## 2022-09-30 ENCOUNTER — Encounter: Payer: Self-pay | Admitting: Nurse Practitioner

## 2022-09-30 ENCOUNTER — Ambulatory Visit: Payer: 59 | Admitting: Nurse Practitioner

## 2022-09-30 VITALS — BP 128/72 | HR 76 | Temp 97.0°F | Resp 20 | Ht 65.0 in | Wt 143.0 lb

## 2022-09-30 DIAGNOSIS — F411 Generalized anxiety disorder: Secondary | ICD-10-CM

## 2022-09-30 DIAGNOSIS — F3342 Major depressive disorder, recurrent, in full remission: Secondary | ICD-10-CM

## 2022-09-30 DIAGNOSIS — I10 Essential (primary) hypertension: Secondary | ICD-10-CM | POA: Diagnosis not present

## 2022-09-30 DIAGNOSIS — Z23 Encounter for immunization: Secondary | ICD-10-CM | POA: Diagnosis not present

## 2022-09-30 DIAGNOSIS — E782 Mixed hyperlipidemia: Secondary | ICD-10-CM

## 2022-09-30 DIAGNOSIS — K219 Gastro-esophageal reflux disease without esophagitis: Secondary | ICD-10-CM

## 2022-09-30 MED ORDER — ESOMEPRAZOLE MAGNESIUM 40 MG PO CPDR: 40.0000 mg | DELAYED_RELEASE_CAPSULE | Freq: Every day | ORAL | 1 refills | Status: DC

## 2022-09-30 MED ORDER — ROSUVASTATIN CALCIUM 10 MG PO TABS: 10.0000 mg | ORAL_TABLET | Freq: Every day | ORAL | 1 refills | Status: DC

## 2022-09-30 MED ORDER — VENLAFAXINE HCL ER 75 MG PO CP24: 150.0000 mg | ORAL_CAPSULE | Freq: Every day | ORAL | 1 refills | Status: DC

## 2022-09-30 MED ORDER — LISINOPRIL 20 MG PO TABS: 20.0000 mg | ORAL_TABLET | Freq: Every day | ORAL | 1 refills | Status: DC

## 2022-09-30 MED ORDER — ALPRAZOLAM 0.5 MG PO TABS: 0.5000 mg | ORAL_TABLET | Freq: Two times a day (BID) | ORAL | 5 refills | Status: DC | PRN

## 2022-09-30 NOTE — Addendum Note (Signed)
Addended by: Rolena Infante on: 09/30/2022 02:37 PM   Modules accepted: Orders

## 2022-09-30 NOTE — Addendum Note (Signed)
Addended by: Chevis Pretty on: 09/30/2022 02:31 PM   Modules accepted: Level of Service

## 2022-09-30 NOTE — Patient Instructions (Signed)

## 2022-09-30 NOTE — Progress Notes (Signed)
Subjective:    Patient ID: Janet Randolph, female    DOB: 1961/08/06, 62 y.o.   MRN: [id number]   Chief Complaint: medical management of chronic issues     HPI:  Janet Randolph is a 62 y.o. who identifies as a female who was assigned female at birth.   Social history: Lives with: husband Work history: works at [employer] in today for follow up of the following chronic medical issues:  1. Primary hypertension No c/o chets pain, sob or headache. Does not check blood pressure at home. BP Readings from Last 3 Encounters:  06/17/22 126/78  03/29/22 131/74  03/14/22 125/79     2. Mixed hyperlipidemia Doe snot watch diet very closely and does no dedicated exercise. Lab Results  Component Value Date   CHOL 157 03/29/2022   HDL 51 03/29/2022   LDLCALC 87 03/29/2022   TRIG 105 03/29/2022   CHOLHDL 3.1 03/29/2022     3. Gastroesophageal reflux disease, unspecified whether esophagitis present Is on nexium daily and is dong well  4. Recurrent major depressive disorder, in full remission [facility]) Has been on effexor for several years and has been doing well.  5. GAD (generalized anxiety disorder) Is on xanax BID    09/30/2022    2:12 PM 03/29/2022    3:00 PM 09/28/2021    2:41 PM 03/26/2021    4:12 PM  GAD 7 : Generalized Anxiety Score  Nervous, Anxious, on Edge 3 3 3 1   Control/stop worrying 2 1 1 1   Worry too much - different things 1 1 1 1   Trouble relaxing 2 1 0 1  Restless 0 1 0 0  Easily annoyed or irritable 2 1 2  0  Afraid - awful might happen 1 1 1 1   Total GAD 7 Score 11 9 8 5   Anxiety Difficulty Somewhat difficult Not difficult at all Somewhat difficult Not difficult at all      New complaints: None today  Allergies  Allergen Reactions   Latex Rash   Penicillins Rash   Outpatient Encounter Medications as of 09/30/2022  Medication Sig   ALPRAZolam (XANAX) 0.5 MG tablet Take 1 tablet (0.5 mg total) by mouth 2 (two) times daily as needed.    Aspirin-Acetaminophen-Caffeine (GOODY HEADACHE PO) Take 3 Packages by mouth daily as needed (headache).   CALCIUM PO Take by mouth daily.   clobetasol ointment (TEMOVATE) 1.30 % Apply 1 application topically daily. Large surface area (Patient taking differently: Apply 1 application  topically daily as needed (dry skin). Large surface area)   Cyanocobalamin (VITAMIN B-12 PO) Take by mouth daily. (Patient not taking: Reported on 06/17/2022)   desonide (DESOWEN) 0.05 % cream Apply topically 2 (two) times daily. (Patient taking differently: Apply 1 application  topically 2 (two) times daily as needed (rash).)   esomeprazole (NEXIUM) 40 MG capsule Take 1 capsule (40 mg total) by mouth daily. Take 30 min before meal   Insulin Pen Needle 31G X 5 MM MISC Use to inject Forteo once daily. DO NOT REUSE NEEDLES.   lisinopril (ZESTRIL) 20 MG tablet Take 1 tablet (20 mg total) by mouth daily.   loperamide (IMODIUM) 2 MG capsule Take 4 mg by mouth daily as needed for diarrhea or loose stools.   Mesalamine (ASACOL) 400 MG CPDR DR capsule Take 400 mg by mouth 4 (four) times daily.   rosuvastatin (CRESTOR) 10 MG tablet Take 1 tablet (10 mg total) by mouth daily.   Teriparatide, Recombinant, (  FORTEO) 600 MCG/2.4ML SOPN Inject 20 mcg into the skin daily. DISCARD PEN AFTER 28 DAYS of use   venlafaxine XR (EFFEXOR-XR) 75 MG 24 hr capsule TAKE 2 CAPSULES (150 MG TOTAL) BY MOUTH DAILY WITH BREAKFAST.   VITAMIN D PO Take 1,000 Units by mouth daily.   Vitamin D, Ergocalciferol, (DRISDOL) 1.25 MG (50000 UNIT) CAPS capsule Take 1 capsule (50,000 Units total) by mouth every 7 (seven) days. (Patient not taking: Reported on 06/17/2022)   No facility-administered encounter medications on file as of 09/30/2022.    Past Surgical History:  Procedure Laterality Date   HAND SURGERY Bilateral    MOLE REMOVAL  06/12/2022   TEE WITHOUT CARDIOVERSION N/A 01/02/2022   Procedure: TRANSESOPHAGEAL ECHOCARDIOGRAM (TEE);  Surgeon:  Lelon Perla, MD;  Location: [facility] ENDOSCOPY;  Service: Cardiovascular;  Laterality: N/A;    Family History  Problem Relation Age of Onset   Melanoma Mother    Asthma Father    Crohn's disease Father    Melanoma Father    Asthma Brother    Heart Problems Brother    Stroke Brother    Healthy Son    Healthy Daughter       Controlled substance contract: n/a     Review of Systems  Constitutional:  Negative for diaphoresis.  Eyes:  Negative for pain.  Respiratory:  Negative for shortness of breath.   Cardiovascular:  Negative for chest pain, palpitations and leg swelling.  Gastrointestinal:  Negative for abdominal pain.  Endocrine: Negative for polydipsia.  Skin:  Negative for rash.  Neurological:  Negative for dizziness, weakness and headaches.  Hematological:  Does not bruise/bleed easily.  All other systems reviewed and are negative.      Objective:   Physical Exam Vitals and nursing note reviewed.  Constitutional:      General: She is not in acute distress.    Appearance: Normal appearance. She is well-developed.  HENT:     Head: Normocephalic.     Right Ear: Tympanic membrane normal.     Left Ear: Tympanic membrane normal.     Nose: Nose normal.     Mouth/Throat:     Mouth: Mucous membranes are moist.  Eyes:     Pupils: Pupils are equal, round, and reactive to light.  Neck:     Vascular: No carotid bruit or JVD.  Cardiovascular:     Rate and Rhythm: Normal rate and regular rhythm.     Heart sounds: Normal heart sounds.  Pulmonary:     Effort: Pulmonary effort is normal. No respiratory distress.     Breath sounds: Normal breath sounds. No wheezing or rales.  Chest:     Chest wall: No tenderness.  Abdominal:     General: Bowel sounds are normal. There is no distension or abdominal bruit.     Palpations: Abdomen is soft. There is no hepatomegaly, splenomegaly, mass or pulsatile mass.     Tenderness: There is no abdominal tenderness.  Musculoskeletal:         General: Normal range of motion.     Cervical back: Normal range of motion and neck supple.  Lymphadenopathy:     Cervical: No cervical adenopathy.  Skin:    General: Skin is warm and dry.  Neurological:     Mental Status: She is alert and oriented to person, place, and time.     Deep Tendon Reflexes: Reflexes are normal and symmetric.  Psychiatric:        Behavior: Behavior normal.  Thought Content: Thought content normal.        Judgment: Judgment normal.    BP 128/72   Pulse 76   Temp (!) 97 F (36.1 C) (Temporal)   Resp 20   Ht 5\' 5"  (1.651 m)   Wt 143 lb (64.9 kg)   SpO2 99%   BMI 23.80 kg/m         Assessment & Plan:   Janet Randolph comes in today with chief complaint of Medical Management of Chronic Issues   Diagnosis and orders addressed:  1. Primary hypertension Low sodium diet - lisinopril (ZESTRIL) 20 MG tablet; Take 1 tablet (20 mg total) by mouth daily.  Dispense: 90 tablet; Refill: 1 - CBC with Differential/Platelet - CMP14+EGFR  2. Mixed hyperlipidemia Low fat diet - rosuvastatin (CRESTOR) 10 MG tablet; Take 1 tablet (10 mg total) by mouth daily.  Dispense: 90 tablet; Refill: 1 - Lipid panel  3. Gastroesophageal reflux disease, unspecified whether esophagitis present Avoid spicy foods Do not eat 2 hours prior to bedtime - esomeprazole (NEXIUM) 40 MG capsule; Take 1 capsule (40 mg total) by mouth daily. Take 30 min before meal  Dispense: 90 capsule; Refill: 1  4. Recurrent major depressive disorder, in full remission (HCC) Stress management - venlafaxine XR (EFFEXOR-XR) 75 MG 24 hr capsule; Take 2 capsules (150 mg total) by mouth daily with breakfast.  Dispense: 180 capsule; Refill: 1  5. GAD (generalized anxiety disorder) - ALPRAZolam (XANAX) 0.5 MG tablet; Take 1 tablet (0.5 mg total) by mouth 2 (two) times daily as needed.  Dispense: 60 tablet; Refill: 5   Labs pending Health Maintenance reviewed Diet and exercise  encouraged  Follow up plan: 6 months   Mary-Margaret Stephania Fragmin, FNP

## 2022-09-30 NOTE — Addendum Note (Signed)
Addended by: Rolena Infante on: 09/30/2022 04:36 PM   Modules accepted: Orders

## 2022-10-01 LAB — CBC WITH DIFFERENTIAL/PLATELET
Basophils Absolute: 0 10*3/uL (ref 0.0–0.2)
Basos: 1 %
EOS (ABSOLUTE): 0.1 10*3/uL (ref 0.0–0.4)
Eos: 2 %
Hematocrit: 35.2 % (ref 34.0–46.6)
Hemoglobin: 11.8 g/dL (ref 11.1–15.9)
Immature Grans (Abs): 0 10*3/uL (ref 0.0–0.1)
Immature Granulocytes: 0 %
Lymphocytes Absolute: 1.5 10*3/uL (ref 0.7–3.1)
Lymphs: 20 %
MCH: 33.1 pg — ABNORMAL HIGH (ref 26.6–33.0)
MCHC: 33.5 g/dL (ref 31.5–35.7)
MCV: 99 fL — ABNORMAL HIGH (ref 79–97)
Monocytes Absolute: 0.5 10*3/uL (ref 0.1–0.9)
Monocytes: 7 %
Neutrophils Absolute: 5.4 10*3/uL (ref 1.4–7.0)
Neutrophils: 70 %
Platelets: 373 10*3/uL (ref 150–450)
RBC: 3.57 x10E6/uL — ABNORMAL LOW (ref 3.77–5.28)
RDW: 12.3 % (ref 11.7–15.4)
WBC: 7.6 10*3/uL (ref 3.4–10.8)

## 2022-10-01 LAB — LIPID PANEL
Chol/HDL Ratio: 2.9 ratio (ref 0.0–4.4)
Cholesterol, Total: 150 mg/dL (ref 100–199)
HDL: 51 mg/dL (ref >39)
LDL Chol Calc (NIH): 79 mg/dL (ref 0–99)
Triglycerides: 112 mg/dL (ref 0–149)
VLDL Cholesterol Cal: 20 mg/dL (ref 5–40)

## 2022-10-01 LAB — CMP14+EGFR
ALT: 7 IU/L (ref 0–32)
AST: 12 IU/L (ref 0–40)
Albumin/Globulin Ratio: 2.2 (ref 1.2–2.2)
Albumin: 4.3 g/dL (ref 3.9–4.9)
Alkaline Phosphatase: 61 IU/L (ref 44–121)
BUN/Creatinine Ratio: 20 (ref 12–28)
BUN: 14 mg/dL (ref 8–27)
Bilirubin Total: <0.2 mg/dL (ref 0.0–1.2)
CO2: 21 mmol/L (ref 20–29)
Calcium: 9 mg/dL (ref 8.7–10.3)
Chloride: 109 mmol/L — ABNORMAL HIGH (ref 96–106)
Creatinine, Ser: 0.7 mg/dL (ref 0.57–1.00)
Globulin, Total: 2 g/dL (ref 1.5–4.5)
Glucose: 77 mg/dL (ref 70–99)
Potassium: 4.1 mmol/L (ref 3.5–5.2)
Sodium: 144 mmol/L (ref 134–144)
Total Protein: 6.3 g/dL (ref 6.0–8.5)
eGFR: 97 mL/min/{1.73_m2} (ref >59)

## 2022-10-02 LAB — TOXASSURE SELECT 13 (MW), URINE

## 2022-10-25 ENCOUNTER — Encounter: Payer: Self-pay | Admitting: Physical Therapy

## 2022-10-25 ENCOUNTER — Other Ambulatory Visit: Payer: Self-pay

## 2022-10-25 ENCOUNTER — Ambulatory Visit: Payer: 59 | Attending: Sports Medicine | Admitting: Physical Therapy

## 2022-10-25 DIAGNOSIS — M5459 Other low back pain: Secondary | ICD-10-CM | POA: Diagnosis present

## 2022-10-25 NOTE — Therapy (Signed)
OUTPATIENT PHYSICAL THERAPY THORACOLUMBAR EVALUATION   Patient Name: Janet Randolph MRN: [id number] DOB:03-08-1961, 62 y.o., female Today's Date: 10/25/2022  END OF SESSION:  PT End of Session - 10/25/22 1313     Visit Number 1    Number of Visits 12    Date for PT Re-Evaluation 11/22/22    Authorization Type FOTO AT LEAST EVERY 5TH VISIT.  PROGRESS NOTE AT 10TH VISIT.  KX MODIFIER AFTER 15 VISITS.    PT Start Time 1030    PT Stop Time 1123    PT Time Calculation (min) 53 min    Activity Tolerance Patient tolerated treatment well    Behavior During Therapy WFL for tasks assessed/performed             Past Medical History:  Diagnosis Date   Depression    Eczema    Hyperlipidemia    Hypertension    Vitamin D deficiency    Past Surgical History:  Procedure Laterality Date   HAND SURGERY Bilateral    MOLE REMOVAL  06/12/2022   TEE WITHOUT CARDIOVERSION N/A 01/02/2022   Procedure: TRANSESOPHAGEAL ECHOCARDIOGRAM (TEE);  Surgeon: Lelon Perla, MD;  Location: [facility] ENDOSCOPY;  Service: Cardiovascular;  Laterality: N/A;   Patient Active Problem List   Diagnosis Date Noted   GERD (gastroesophageal reflux disease) 04/03/2016   Eczema of both hands 07/12/2015   GAD (generalized anxiety disorder) 06/04/2013   Hyperlipidemia 03/05/2013   Hypertension 03/05/2013   Depression 02/09/2013   REFERRING PROVIDER: Vickki Hearing MD  REFERRING DIAG: Low back pain.  Rationale for Evaluation and Treatment: Rehabilitation  THERAPY DIAG:  Other low back pain - Plan: PT plan of care cert/re-cert  ONSET DATE: ~3 weeks ago.  SUBJECTIVE:                                                                                                                                                                                           SUBJECTIVE STATEMENT: The patient presents to the clinic with c/o low back pain with radiation over her right anterior thigh.  She states this came on about three  weeks ago without warning when she got out of bed and her right knee gave way and she fell.  In fact, she has now had multiple falls.  We discussed her using a cane at all times for safety.  She was on a regimen of Prednisone that was helpful.  She is scheduled for an MRI next Thursday, 10/31/22.  Her pain is rated at a 5/10 but has been severe.   PERTINENT HISTORY:  OP, Bilateral hand surgery  PAIN:  Are you having  pain? Yes: NPRS scale: 5/10 Pain location: Across low back and into right anterior thigh. Pain description: Ache, hurt. Aggravating factors: Standing. Relieving factors: Aleve, sitting.  PRECAUTIONS: Other: OP.  No spinal loading.  WEIGHT BEARING RESTRICTIONS: No  FALLS:  Has patient fallen in last 6 months? Yes. Number of falls 5.  Highly recommended use of cane at all times.  OCCUPATION: Computer work.  PLOF: Independent  PATIENT GOALS: "Get back to normal."   OBJECTIVE:   PATIENT SURVEYS:  FOTO 40.  POSTURE: No Significant postural limitations  PALPATION: C/o pain across her lower lumbar region with referral over right anterior thigh.  LOWER EXTREMITY ROM:     In supine:  Full bilateral hip flexion.  LOWER EXTREMITY MMT:   Right hip flexion ~4/5 per contralateral comparison.  LUMBAR SPECIAL TESTS:  Equal leg lengths. (-) right SLR, although, patient states this test would have likely been excruciating a couple of weeks ago.   DTR's:  Right Patellar reflex decreased per contralateral comparison.  GAIT: Essentially normal this morning.  TODAY'S TREATMENT:                                                                                                                              DATE: HMP and IFC at 80-150 Hx on 40% scan x Patient tolerated treatment without complaint with normal modality response following removal of modality.    PATIENT EDUCATION:  Education details: As below. Person educated: Patient Education method: Explanation, Demonstration,  and Handouts Education comprehension: verbalized understanding and returned demonstration  HOME EXERCISE PROGRAM: [place] by [place] Katrina Brosh Feb 16th, 2024 View at [email or web address] using code: [id number] Total 2 Page 1 of 1 SINGLE KNEE TO CHEST STRETCH - SKTC While Lying on your back, hold your knee and gently pull it up towards your chest. Repeat 3 Times Hold 30 Seconds Complete 1 Set Perform 3 Times a Day Double Knee to chest stretch Pull your knees up to your chest hold for 30 seconds then relax. Repeat Continue to breathe throughout stretch. Repeat 2 Times Hold 30 Seconds Complete 3 Sets Perform 2 Times a Day  ASSESSMENT:  CLINICAL IMPRESSION: The patient presents to OPPT with c/o low back and pain into her right anterior thigh that came on without warning approximately three weeks ago.  She had had multiple falls when her right knee gives out.  Her right Patellar reflex is decreased.  She is weak into right hip flexion.  The pain has limited her ability to stand and walk for prolonged periods of time.  Patient will benefit from skilled physical therapy intervention to address pain and deficits.  OBJECTIVE IMPAIRMENTS: decreased activity tolerance, decreased mobility, decreased strength, increased muscle spasms, and pain.   ACTIVITY LIMITATIONS: carrying, lifting, and standing  PARTICIPATION LIMITATIONS: meal prep, cleaning, and laundry  REHAB POTENTIAL: Good  CLINICAL DECISION MAKING: Evolving/moderate complexity  EVALUATION COMPLEXITY: Moderate   GOALS: Goals reviewed with patient? Yes  LONG TERM GOALS: Target date: 11/22/22.  Ind with an HEP.  Goal status: INITIAL  2.  Eliminate right LE symptoms.  Goal status: INITIAL  3.  Increase right hip flexion strength to 5/5.  Goal status: INITIAL  4.  No complaints of right knee giving way.  Goal status: INITIAL  5.  Perform ADL's with pain not > 2-3/10.  Goal status:  INITIAL  PLAN:  PT FREQUENCY:  2-3 times a week.  PT DURATION: 4 weeks  PLANNED INTERVENTIONS: Therapeutic exercises, Therapeutic activity, Neuromuscular re-education, Balance training, Gait training, Patient/Family education, Self Care, Dry Needling, Electrical stimulation, Cryotherapy, Moist heat, Ultrasound, and Manual therapy.  PLAN FOR NEXT SESSION: Review HEP, hip bridges, core exercise progression.  Modalities and STW/M as needed.   Mikaya Bunner, [place], PT 10/25/2022, 2:04 PM

## 2022-10-28 ENCOUNTER — Ambulatory Visit: Payer: 59

## 2022-10-29 ENCOUNTER — Encounter: Payer: Self-pay | Admitting: Physical Therapy

## 2022-10-29 ENCOUNTER — Ambulatory Visit: Payer: 59 | Admitting: Physical Therapy

## 2022-10-29 DIAGNOSIS — M5459 Other low back pain: Secondary | ICD-10-CM | POA: Diagnosis not present

## 2022-10-29 NOTE — Therapy (Signed)
OUTPATIENT PHYSICAL THERAPY THORACOLUMBAR EVALUATION   Patient Name: Janet Randolph MRN: [id number] DOB:1960/10/09, 62 y.o., female Today's Date: 10/29/2022  END OF SESSION:  PT End of Session - 10/29/22 1559     Visit Number 2    Number of Visits 12    Date for PT Re-Evaluation 11/22/22    Authorization Type FOTO AT LEAST EVERY 5TH VISIT.  PROGRESS NOTE AT 10TH VISIT.  KX MODIFIER AFTER 15 VISITS.    PT Start Time 1559    PT Stop Time 1650    PT Time Calculation (min) 51 min    Activity Tolerance Patient tolerated treatment well    Behavior During Therapy WFL for tasks assessed/performed            Past Medical History:  Diagnosis Date   Depression    Eczema    Hyperlipidemia    Hypertension    Vitamin D deficiency    Past Surgical History:  Procedure Laterality Date   HAND SURGERY Bilateral    MOLE REMOVAL  06/12/2022   TEE WITHOUT CARDIOVERSION N/A 01/02/2022   Procedure: TRANSESOPHAGEAL ECHOCARDIOGRAM (TEE);  Surgeon: Lelon Perla, MD;  Location: [facility] ENDOSCOPY;  Service: Cardiovascular;  Laterality: N/A;   Patient Active Problem List   Diagnosis Date Noted   GERD (gastroesophageal reflux disease) 04/03/2016   Eczema of both hands 07/12/2015   GAD (generalized anxiety disorder) 06/04/2013   Hyperlipidemia 03/05/2013   Hypertension 03/05/2013   Depression 02/09/2013   REFERRING PROVIDER: Vickki Hearing MD  REFERRING DIAG: Low back pain.  Rationale for Evaluation and Treatment: Rehabilitation  THERAPY DIAG:  Other low back pain  ONSET DATE: ~3 weeks ago.  SUBJECTIVE:                                                                                                                                                                                           SUBJECTIVE STATEMENT: Reports feeling okay upon arrival but did have some discomfort with standing. Much better than she was a few weeks ago and hasn't had to even take Aleve today.   PERTINENT  HISTORY:  OP, Bilateral hand surgery  PAIN:  Are you having pain? No.  PRECAUTIONS: Other: OP.  No spinal loading.  PATIENT GOALS: "Get back to normal."  OBJECTIVE:   PATIENT SURVEYS:  FOTO 40.  PALPATION: C/o pain across her lower lumbar region with referral over right anterior thigh.  LOWER EXTREMITY MMT:   Right hip flexion ~4/5 per contralateral comparison.  LUMBAR SPECIAL TESTS:  Equal leg lengths. (-) right SLR, although, patient states this test would have likely been excruciating a couple of  weeks ago.   DTR's:  Right Patellar reflex decreased per contralateral comparison.  TODAY'S TREATMENT:                                                                                                                              DATE:                           EXERCISE LOG  Exercise Repetitions and Resistance Comments  Nustep L3 x12 min   R figure 4 stretch 1x60 sec   SLR 2x10 reps   Bridges X20 reps   R hip abduction X20 reps   R hip clam X20 reps   POE X3 min   Prone pressups X10 reps    Blank cell = exercise not performed today   PATIENT EDUCATION:  Education details: Posture/ADLs handout, movement pattern changes Person educated: Patient Education method: Explanation, Demonstration, and Handouts Education comprehension: verbalized understanding and returned demonstration  HOME EXERCISE PROGRAM: [place] by Mali Applegate Feb 16th, 2024 View at [email or web address] using code: [id number] Total 2 Page 1 of 1 SINGLE KNEE TO CHEST STRETCH - SKTC While Lying on your back, hold your knee and gently pull it up towards your chest. Repeat 3 Times Hold 30 Seconds Complete 1 Set Perform 3 Times a Day Double Knee to chest stretch Pull your knees up to your chest hold for 30 seconds then relax. Repeat Continue to breathe throughout stretch. Repeat 2 Times Hold 30 Seconds Complete 3 Sets Perform 2 Times a Day  ASSESSMENT:  CLINICAL  IMPRESSION: Patient presented in clinic with reports of no current LBP but does have some pain in RLE with standing. Patient reports that she is unable to stand comfortably for more than approximately 5 minutes. Patient educated of her condition and posture/ADLs handouts for movement pattern changes to avoid further exacerbation of LBP. Patient progressed through [facility] strengthening due to LE weakness that has started since start of symptoms. No increased symptoms reported during therex. Patient encouraged to be mindful of posture/ADLs handout and continue stretches as provided in evaluation.  OBJECTIVE IMPAIRMENTS: decreased activity tolerance, decreased mobility, decreased strength, increased muscle spasms, and pain.   ACTIVITY LIMITATIONS: carrying, lifting, and standing  PARTICIPATION LIMITATIONS: meal prep, cleaning, and laundry  REHAB POTENTIAL: Good  CLINICAL DECISION MAKING: Evolving/moderate complexity  EVALUATION COMPLEXITY: Moderate  GOALS: Goals reviewed with patient? Yes  LONG TERM GOALS: Target date: 11/22/22.  Ind with an HEP.  Goal status: INITIAL  2.  Eliminate right LE symptoms.  Goal status: INITIAL  3.  Increase right hip flexion strength to 5/5.  Goal status: INITIAL  4.  No complaints of right knee giving way.  Goal status: INITIAL  5.  Perform ADL's with pain not > 2-3/10.  Goal status: INITIAL  PLAN:  PT FREQUENCY:  2-3 times a week.  PT DURATION: 4 weeks  PLANNED INTERVENTIONS: Therapeutic exercises,  Therapeutic activity, Neuromuscular re-education, Balance training, Gait training, Patient/Family education, Self Care, Dry Needling, Electrical stimulation, Cryotherapy, Moist heat, Ultrasound, and Manual therapy.  PLAN FOR NEXT SESSION: Review HEP, hip bridges, core exercise progression.  Modalities and STW/M as needed.  Standley Brooking, PTA 10/29/2022, 5:34 PM

## 2022-10-29 NOTE — Patient Instructions (Signed)

## 2022-10-31 ENCOUNTER — Ambulatory Visit: Payer: 59 | Admitting: Physical Therapy

## 2022-10-31 ENCOUNTER — Encounter: Payer: Self-pay | Admitting: Physical Therapy

## 2022-10-31 DIAGNOSIS — M5459 Other low back pain: Secondary | ICD-10-CM | POA: Diagnosis not present

## 2022-10-31 NOTE — Therapy (Signed)
OUTPATIENT PHYSICAL THERAPY THORACOLUMBAR TREATMENT   Patient Name: Janet Randolph MRN: [id number] DOB:1961/08/11, 62 y.o., female Today's Date: 10/31/2022  END OF SESSION:  PT End of Session - 10/31/22 1613     Visit Number 3    Number of Visits 12    Date for PT Re-Evaluation 11/22/22    Authorization Type FOTO AT LEAST EVERY 5TH VISIT.  PROGRESS NOTE AT 10TH VISIT.  KX MODIFIER AFTER 15 VISITS.    PT Start Time 1607    Activity Tolerance Patient tolerated treatment well    Behavior During Therapy [facility] for tasks assessed/performed            Past Medical History:  Diagnosis Date   Depression    Eczema    Hyperlipidemia    Hypertension    Vitamin D deficiency    Past Surgical History:  Procedure Laterality Date   HAND SURGERY Bilateral    MOLE REMOVAL  06/12/2022   TEE WITHOUT CARDIOVERSION N/A 01/02/2022   Procedure: TRANSESOPHAGEAL ECHOCARDIOGRAM (TEE);  Surgeon: Lelon Perla, MD;  Location: [facility] ENDOSCOPY;  Service: Cardiovascular;  Laterality: N/A;   Patient Active Problem List   Diagnosis Date Noted   GERD (gastroesophageal reflux disease) 04/03/2016   Eczema of both hands 07/12/2015   GAD (generalized anxiety disorder) 06/04/2013   Hyperlipidemia 03/05/2013   Hypertension 03/05/2013   Depression 02/09/2013   REFERRING PROVIDER: Vickki Hearing MD  REFERRING DIAG: Low back pain.  Rationale for Evaluation and Treatment: Rehabilitation  THERAPY DIAG:  Other low back pain  ONSET DATE: ~3 weeks ago.  SUBJECTIVE:                                                                                                                                                                                           SUBJECTIVE STATEMENT: Reports that she doesn't have the pain like she felt before but is fearful of stairs due to fear of her LE giving out.  PERTINENT HISTORY:  OP, Bilateral hand surgery  PAIN: Are you having pain? No.  PRECAUTIONS: Other: OP.  No  spinal loading.  PATIENT GOALS: "Get back to normal."  OBJECTIVE:   PATIENT SURVEYS:  FOTO 40.  PALPATION: C/o pain across her lower lumbar region with referral over right anterior thigh.  LOWER EXTREMITY MMT:   Right hip flexion ~4/5 per contralateral comparison.  LUMBAR SPECIAL TESTS:  Equal leg lengths. (-) right SLR, although, patient states this test would have likely been excruciating a couple of weeks ago.   DTR's:  Right Patellar reflex decreased per contralateral comparison.  TODAY'S TREATMENT:  DATE:                           EXERCISE LOG  Exercise Repetitions and Resistance Comments  Nustep L3 x19 min   SLR 2x10 reps   Bridges X20 reps 3-5 sec holds   B hip clam X30 reps red theraband   Shoulder extension Blue XTS x20 reps        Blank cell = exercise not performed today   PATIENT EDUCATION:  Education details: Posture/ADLs handout, movement pattern changes Person educated: Patient Education method: Explanation, Demonstration, and Handouts Education comprehension: verbalized understanding and returned demonstration  HOME EXERCISE PROGRAM: [place] by Mali Applegate Feb 16th, 2024 View at [email or web address] using code: [id number] Total 2 Page 1 of 1 SINGLE KNEE TO CHEST STRETCH - SKTC While Lying on your back, hold your knee and gently pull it up towards your chest. Repeat 3 Times Hold 30 Seconds Complete 1 Set Perform 3 Times a Day Double Knee to chest stretch Pull your knees up to your chest hold for 30 seconds then relax. Repeat Continue to breathe throughout stretch. Repeat 2 Times Hold 30 Seconds Complete 3 Sets Perform 2 Times a Day  ASSESSMENT:  CLINICAL IMPRESSION: Patient presented in clinic with reports of no current LBP. Patient reports hesitancy with many stairs as she feels like LE is  weak and may give out on her. Patient able to be progressed through strengthening with and without resistance with no complaints of increased pain. Patient instructed in standing posture today. Patient leaving clinic for MRI after PT session.  OBJECTIVE IMPAIRMENTS: decreased activity tolerance, decreased mobility, decreased strength, increased muscle spasms, and pain.   ACTIVITY LIMITATIONS: carrying, lifting, and standing  PARTICIPATION LIMITATIONS: meal prep, cleaning, and laundry  REHAB POTENTIAL: Good  CLINICAL DECISION MAKING: Evolving/moderate complexity  EVALUATION COMPLEXITY: Moderate  GOALS: Goals reviewed with patient? Yes  LONG TERM GOALS: Target date: 11/22/22.  Ind with an HEP.  Goal status: INITIAL  2.  Eliminate right LE symptoms.  Goal status: INITIAL  3.  Increase right hip flexion strength to 5/5.  Goal status: INITIAL  4.  No complaints of right knee giving way.  Goal status: INITIAL  5.  Perform ADL's with pain not > 2-3/10.  Goal status: INITIAL  PLAN:  PT FREQUENCY:  2-3 times a week.  PT DURATION: 4 weeks  PLANNED INTERVENTIONS: Therapeutic exercises, Therapeutic activity, Neuromuscular re-education, Balance training, Gait training, Patient/Family education, Self Care, Dry Needling, Electrical stimulation, Cryotherapy, Moist heat, Ultrasound, and Manual therapy.  PLAN FOR NEXT SESSION: Review HEP, hip bridges, core exercise progression.  Modalities and STW/M as needed.  Standley Brooking, PTA 10/31/2022, 6:04 PM

## 2022-11-05 ENCOUNTER — Ambulatory Visit: Payer: 59 | Admitting: Physical Therapy

## 2022-11-07 NOTE — Therapy (Incomplete)
OUTPATIENT PHYSICAL THERAPY THORACOLUMBAR TREATMENT   Patient Name: Janet Randolph MRN: [id number] DOB:05-Nov-1960, 62 y.o., female Today's Date: 10/31/2022  END OF SESSION:  PT End of Session - 10/31/22 1613     Visit Number 3    Number of Visits 12    Date for PT Re-Evaluation 11/22/22    Authorization Type FOTO AT LEAST EVERY 5TH VISIT.  PROGRESS NOTE AT 10TH VISIT.  KX MODIFIER AFTER 15 VISITS.    PT Start Time 1607    Activity Tolerance Patient tolerated treatment well    Behavior During Therapy [facility] for tasks assessed/performed            Past Medical History:  Diagnosis Date   Depression    Eczema    Hyperlipidemia    Hypertension    Vitamin D deficiency    Past Surgical History:  Procedure Laterality Date   HAND SURGERY Bilateral    MOLE REMOVAL  06/12/2022   TEE WITHOUT CARDIOVERSION N/A 01/02/2022   Procedure: TRANSESOPHAGEAL ECHOCARDIOGRAM (TEE);  Surgeon: Lelon Perla, MD;  Location: [facility] ENDOSCOPY;  Service: Cardiovascular;  Laterality: N/A;   Patient Active Problem List   Diagnosis Date Noted   GERD (gastroesophageal reflux disease) 04/03/2016   Eczema of both hands 07/12/2015   GAD (generalized anxiety disorder) 06/04/2013   Hyperlipidemia 03/05/2013   Hypertension 03/05/2013   Depression 02/09/2013   REFERRING PROVIDER: Vickki Hearing MD  REFERRING DIAG: Low back pain.  Rationale for Evaluation and Treatment: Rehabilitation  THERAPY DIAG:  Other low back pain  ONSET DATE: ~3 weeks ago.  SUBJECTIVE:                                                                                                                                                                                           SUBJECTIVE STATEMENT: ***  PERTINENT HISTORY:  OP, Bilateral hand surgery  PAIN: Are you having pain? No.  PRECAUTIONS: Other: OP.  No spinal loading.  PATIENT GOALS: "Get back to normal."  OBJECTIVE:   PATIENT SURVEYS:  FOTO 40.  PALPATION: C/o  pain across her lower lumbar region with referral over right anterior thigh.  LOWER EXTREMITY MMT:   Right hip flexion ~4/5 per contralateral comparison.  LUMBAR SPECIAL TESTS:  Equal leg lengths. (-) right SLR, although, patient states this test would have likely been excruciating a couple of weeks ago.   DTR's:  Right Patellar reflex decreased per contralateral comparison.  TODAY'S TREATMENT:  DATE:                                                              2/29 EXERCISE LOG  Exercise Repetitions and Resistance Comments                       Blank cell = exercise not performed today  2/22 EXERCISE LOG  Exercise Repetitions and Resistance Comments  Nustep L3 x19 min   SLR 2x10 reps   Bridges X20 reps 3-5 sec holds   B hip clam X30 reps red theraband   Shoulder extension Blue XTS x20 reps        Blank cell = exercise not performed today   PATIENT EDUCATION:  Education details: Statistician, movement pattern changes Person educated: Patient Education method: Explanation, Demonstration, and Handouts Education comprehension: verbalized understanding and returned demonstration  HOME EXERCISE PROGRAM: [place] by Mali Applegate Feb 16th, 2024 View at [email or web address] using code: [id number] Total 2 Page 1 of 1 SINGLE KNEE TO CHEST STRETCH - SKTC While Lying on your back, hold your knee and gently pull it up towards your chest. Repeat 3 Times Hold 30 Seconds Complete 1 Set Perform 3 Times a Day Double Knee to chest stretch Pull your knees up to your chest hold for 30 seconds then relax. Repeat Continue to breathe throughout stretch. Repeat 2 Times Hold 30 Seconds Complete 3 Sets Perform 2 Times a Day  ASSESSMENT:  CLINICAL IMPRESSION: ***  OBJECTIVE IMPAIRMENTS: decreased activity tolerance, decreased  mobility, decreased strength, increased muscle spasms, and pain.   ACTIVITY LIMITATIONS: carrying, lifting, and standing  PARTICIPATION LIMITATIONS: meal prep, cleaning, and laundry  REHAB POTENTIAL: Good  CLINICAL DECISION MAKING: Evolving/moderate complexity  EVALUATION COMPLEXITY: Moderate  GOALS: Goals reviewed with patient? Yes  LONG TERM GOALS: Target date: 11/22/22.  Ind with an HEP.  Goal status: INITIAL  2.  Eliminate right LE symptoms.  Goal status: INITIAL  3.  Increase right hip flexion strength to 5/5.  Goal status: INITIAL  4.  No complaints of right knee giving way.  Goal status: INITIAL  5.  Perform ADL's with pain not > 2-3/10.  Goal status: INITIAL  PLAN:  PT FREQUENCY:  2-3 times a week.  PT DURATION: 4 weeks  PLANNED INTERVENTIONS: Therapeutic exercises, Therapeutic activity, Neuromuscular re-education, Balance training, Gait training, Patient/Family education, Self Care, Dry Needling, Electrical stimulation, Cryotherapy, Moist heat, Ultrasound, and Manual therapy.  PLAN FOR NEXT SESSION: Review HEP, hip bridges, core exercise progression.  Modalities and STW/M as needed.  Standley Brooking, PTA 10/31/2022, 6:04 PM

## 2022-11-12 ENCOUNTER — Ambulatory Visit: Payer: 59 | Admitting: Physical Therapy

## 2022-11-14 ENCOUNTER — Encounter: Payer: 59 | Admitting: Physical Therapy

## 2022-11-19 ENCOUNTER — Telehealth: Payer: Self-pay | Admitting: *Deleted

## 2022-11-19 NOTE — Telephone Encounter (Signed)
Patient contacted the office and states she was started on Forteo last years around July. Patient states she has been terrible about taking it as she should. Patient states she would like to discuss other options that are less frequent. Noted patient's upcoming appointment to discuss treatment options.

## 2022-12-08 NOTE — Progress Notes (Signed)
Office Visit Note  Patient: Janet Randolph             Date of Birth: 1961-02-19           MRN: [id number]             PCP: Bennie Pierini, FNP Referring: Bennie Pierini, * Visit Date: 12/19/2022 Occupation: @  Subjective:  Medication management  History of Present Illness: Janet Randolph is a 62 y.o. female with history of osteoarthritis and osteoporosis.  She has been on Forteo daily injections since Jan 29, 2022.  She has been tolerating the injections well.  Patient states that she forgets to take Forteo injection.  She has taken Forteo probably 50% of the time.  She took the last Forteo injection more than a month ago.  She would like to discontinue Forteo and try some other medication.  She also takes calcium and vitamin D on a regular basis.  She has been exercising on a regular basis.  She has osteoarthritis in her hands.  Does not have much discomfort in her hands.  She states couple of months ago she started having increased lower back pain.  She was evaluated at [facility] where she had the lumbar spine x-rays followed by MRI.  According the patient she was diagnosed with degenerative disease of lumbar spine and a spinal stenosis.  She had physical therapy and was given prednisone taper and the back symptoms have improved.  She states she gained weight on prednisone.    Activities of Daily Living:  Patient reports morning stiffness for 1 hour.   Patient Denies nocturnal pain.  Difficulty dressing/grooming: Denies Difficulty climbing stairs: Denies Difficulty getting out of chair: Denies Difficulty using hands for taps, buttons, cutlery, and/or writing: Denies  Review of Systems  Constitutional:  Positive for fatigue.  HENT:  Positive for mouth dryness. Negative for mouth sores.   Eyes:  Positive for dryness.  Respiratory:  Positive for shortness of breath.   Cardiovascular:  Negative for chest pain and palpitations.  Gastrointestinal:  Negative  for blood in stool, constipation and diarrhea.  Endocrine: Negative for increased urination.  Genitourinary:  Positive for nocturia. Negative for involuntary urination.  Musculoskeletal:  Positive for morning stiffness. Negative for joint pain, gait problem, joint pain, joint swelling, myalgias, muscle tenderness and myalgias.  Skin:  Negative for color change, rash, hair loss and sensitivity to sunlight.  Allergic/Immunologic: Negative for susceptible to infections.  Neurological:  Negative for dizziness and headaches.  Hematological:  Negative for swollen glands.  Psychiatric/Behavioral:  Positive for depressed mood. Negative for sleep disturbance. The patient is nervous/anxious.     PMFS History:  Patient Active Problem List   Diagnosis Date Noted   GERD (gastroesophageal reflux disease) 04/03/2016   Eczema of both hands 07/12/2015   GAD (generalized anxiety disorder) 06/04/2013   Hyperlipidemia 03/05/2013   Hypertension 03/05/2013   Depression 02/09/2013    Past Medical History:  Diagnosis Date   Depression    Eczema    Hyperlipidemia    Hypertension    Vitamin D deficiency     Family History  Problem Relation Age of Onset   Melanoma Mother    Asthma Father    Crohn's disease Father    Melanoma Father    Asthma Brother    Heart Problems Brother    Stroke Brother    Healthy Son    Healthy Daughter    Past Surgical History:  Procedure Laterality Date   HAND  SURGERY Bilateral    MOLE REMOVAL  06/12/2022   TEE WITHOUT CARDIOVERSION N/A 01/02/2022   Procedure: TRANSESOPHAGEAL ECHOCARDIOGRAM (TEE);  Surgeon: Lewayne Bunting, MD;  Location: [facility] ENDOSCOPY;  Service: Cardiovascular;  Laterality: N/A;   Social History   Social History Narrative   Not on file   Immunization History  Administered Date(s) Administered   PFIZER(Purple Top)SARS-COV-2 Vaccination 11/25/2019, 12/20/2019, 08/29/2020   Tdap 07/23/2010, 07/20/2021   Zoster Recombinat (Shingrix) 09/28/2021,  09/30/2022     Objective: Vital Signs: BP (!) 153/84 (BP Location: Left Arm, Patient Position: Sitting, Cuff Size: Normal)   Pulse 76   Resp 17   Ht 5\' 3"  (1.6 m)   Wt 147 lb 6.4 oz (66.9 kg)   BMI 26.11 kg/m    Physical Exam Vitals and nursing note reviewed.  Constitutional:      Appearance: She is well-developed.  HENT:     Head: Normocephalic and atraumatic.  Eyes:     Conjunctiva/sclera: Conjunctivae normal.  Cardiovascular:     Rate and Rhythm: Normal rate and regular rhythm.     Heart sounds: Normal heart sounds.  Pulmonary:     Effort: Pulmonary effort is normal.     Breath sounds: Normal breath sounds.  Abdominal:     General: Bowel sounds are normal.     Palpations: Abdomen is soft.  Musculoskeletal:     Cervical back: Normal range of motion.  Lymphadenopathy:     Cervical: No cervical adenopathy.  Skin:    General: Skin is warm and dry.     Capillary Refill: Capillary refill takes less than 2 seconds.  Neurological:     Mental Status: She is alert and oriented to person, place, and time.  Psychiatric:        Behavior: Behavior normal.      Musculoskeletal Exam: Cervical, thoracic and lumbar spine were in good range of motion.  She had discomfort range of motion of her lumbar spine.  Shoulder joints, elbow joints, wrist joints, MCPs PIPs and DIPs been good range of motion with no synovitis.  Hip joints, knee joints were in good range of motion without any warmth swelling or effusion.  There was no tenderness over ankles or MTPs.  CDAI Exam: CDAI Score: -- Patient Global: --; Provider Global: -- Swollen: --; Tender: -- Joint Exam 12/19/2022   No joint exam has been documented for this visit   There is currently no information documented on the homunculus. Go to the Rheumatology activity and complete the homunculus joint exam.  Investigation: No additional findings.  Imaging: No results found.  Recent Labs: Lab Results  Component Value Date   WBC  7.6 09/30/2022   HGB 11.8 09/30/2022   PLT 373 09/30/2022   NA 144 09/30/2022   K 4.1 09/30/2022   CL 109 (H) 09/30/2022   CO2 21 09/30/2022   GLUCOSE 77 09/30/2022   BUN 14 09/30/2022   CREATININE 0.70 09/30/2022   BILITOT <0.2 09/30/2022   ALKPHOS 61 09/30/2022   AST 12 09/30/2022   ALT 7 09/30/2022   PROT 6.3 09/30/2022   ALBUMIN 4.3 09/30/2022   CALCIUM 9.0 09/30/2022   GFRAA 112 02/22/2020    Speciality Comments: Forteo 05/23-02/24-took 50% of the time  Procedures:  No procedures performed Allergies: Latex and Penicillins   Assessment / Plan:     Visit Diagnoses: Other osteoporosis without current pathological fracture - DEXA 09/06/21:LFN BMD 0.491 T-score -3.2. h/o bilateral wrist fracture, history of reflux and she is  a smoker.  Patient was initiated on Forteo on 01/29/2022.  Patient discontinued Forteo about a month ago.  She states she took Forteo only intermittently probably 50% of the time.  She does not like taking daily injections.  I had a detailed discussion with the patient regarding different treatment options.  I discussed the option of subcutaneous Evenity or IV Reclast.  After reviewing indications side effects contraindications patient wanted to proceed with IV Reclast.  A handout on Reclast was provided.  Increased risk of osteonecrosis of the jaw and atypical fracture was discussed at length.  Will apply for IV Reclast infusion.  She cannot take oral bisphosphonates due to severe gastroesophageal reflux.  Despite being on Nexium 40 mg p.o. daily she continues to have GI symptoms.  She has been taking vitamin D 1000 units daily.  She has been also trying to exercise on a regular basis.  Daily intake of calcium and vitamin D was discussed.  Need for regular exercise and resistive exercises was discussed.  Good dental hygiene including flossing and brushing was discussed.  Regular dental visits were advised.  Patient denies any impending dental work.  History of wrist  fracture - Left wrist fracture in 2020 and right wrist fracture in 2021 after a fall.  Slightly limited range of motion of the right wrist joint  Medication monitoring encounter-Labs obtained on September 30, 2022 CBC and CMP were normal.  Vitamin D deficiency - Vitamin D 1000 units daily.  Vitamin D was 40 on June 17, 2022.  Primary osteoarthritis of both hands-she has mild PIP and DIP thickening.  No synovitis was noted.  Joint protection muscle strengthening was discussed.  DDD (degenerative disc disease), lumbar-she recently developed sciatica.  Patient was evaluated at [facility] and was diagnosed with degenerative disc disease based on the x-rays and MRI.  Patient states she also has a spinal stenosis.  She was referred to physical therapy.  Some of the exercises were demonstrated in the office.  A handout on lumbar spine exercises was given.  Other medical problems are listed as follows:  Eczema of both hands  Primary hypertension-blood pressure was elevated 153/84.  She was advised to monitor blood pressure closely and follow-up with the PCP.  Murmur  Mixed hyperlipidemia  History of gastroesophageal reflux (GERD)  History of colitis  History of emphysema  Anxiety and depression  Thyroid nodule  Smoker-she continues to smoke.  Smoking cessation was discussed.  Increased risk of osteoporosis with the smoking was discussed.  She had a CT chest lung cancer screening on September 07, 2021.  Orders: No orders of the defined types were placed in this encounter.  No orders of the defined types were placed in this encounter.   Face-to-face time spent with patient was 30 minutes. Greater than 50% of time was spent in counseling and coordination of care.  Follow-Up Instructions: Return in about 6 months (around 06/20/2023) for Osteoarthritis, Osteoporosis.   Pollyann Savoy, MD  Note - This record has been created using Animal nutritionist.  Chart creation errors have been  sought, but may not always  have been located. Such creation errors do not reflect on  the standard of medical care.

## 2022-12-19 ENCOUNTER — Encounter: Payer: Self-pay | Admitting: Rheumatology

## 2022-12-19 ENCOUNTER — Ambulatory Visit: Payer: 59 | Attending: Rheumatology | Admitting: Rheumatology

## 2022-12-19 VITALS — BP 153/84 | HR 76 | Resp 17 | Ht 63.0 in | Wt 147.4 lb

## 2022-12-19 DIAGNOSIS — F17200 Nicotine dependence, unspecified, uncomplicated: Secondary | ICD-10-CM

## 2022-12-19 DIAGNOSIS — E782 Mixed hyperlipidemia: Secondary | ICD-10-CM

## 2022-12-19 DIAGNOSIS — M818 Other osteoporosis without current pathological fracture: Secondary | ICD-10-CM | POA: Diagnosis not present

## 2022-12-19 DIAGNOSIS — E559 Vitamin D deficiency, unspecified: Secondary | ICD-10-CM

## 2022-12-19 DIAGNOSIS — F32A Depression, unspecified: Secondary | ICD-10-CM

## 2022-12-19 DIAGNOSIS — M19041 Primary osteoarthritis, right hand: Secondary | ICD-10-CM

## 2022-12-19 DIAGNOSIS — E041 Nontoxic single thyroid nodule: Secondary | ICD-10-CM

## 2022-12-19 DIAGNOSIS — Z8781 Personal history of (healed) traumatic fracture: Secondary | ICD-10-CM

## 2022-12-19 DIAGNOSIS — Z8719 Personal history of other diseases of the digestive system: Secondary | ICD-10-CM

## 2022-12-19 DIAGNOSIS — M19042 Primary osteoarthritis, left hand: Secondary | ICD-10-CM

## 2022-12-19 DIAGNOSIS — M5136 Other intervertebral disc degeneration, lumbar region: Secondary | ICD-10-CM

## 2022-12-19 DIAGNOSIS — L309 Dermatitis, unspecified: Secondary | ICD-10-CM

## 2022-12-19 DIAGNOSIS — Z5181 Encounter for therapeutic drug level monitoring: Secondary | ICD-10-CM | POA: Diagnosis not present

## 2022-12-19 DIAGNOSIS — F419 Anxiety disorder, unspecified: Secondary | ICD-10-CM

## 2022-12-19 DIAGNOSIS — J439 Emphysema, unspecified: Secondary | ICD-10-CM

## 2022-12-19 DIAGNOSIS — I10 Essential (primary) hypertension: Secondary | ICD-10-CM

## 2022-12-19 DIAGNOSIS — R011 Cardiac murmur, unspecified: Secondary | ICD-10-CM

## 2022-12-19 NOTE — Patient Instructions (Addendum)
Zoledronic Acid Injection (Bone Disorders) What is this medication? ZOLEDRONIC ACID (ZOE le dron ik AS id) prevents and treats osteoporosis. It may also be used to treat Paget's disease of the bone. It works by making your bones stronger and less likely to break (fracture). It belongs to a group of medications called bisphosphonates. This medicine may be used for other purposes; ask your health care provider or pharmacist if you have questions. COMMON BRAND NAME(S): Reclast What should I tell my care team before I take this medication? They need to know if you have any of these conditions: Bleeding disorder Cancer Dental disease Kidney disease Low levels of calcium in the blood Low red blood cell counts Lung or breathing disease, such as asthma Receiving steroids, such as dexamethasone or prednisone An unusual or allergic reaction to zoledronic acid, other medications, foods, dyes, or preservatives Pregnant or trying to get pregnant Breast-feeding How should I use this medication? This medication is injected into a vein. It is given by your care team in a hospital or clinic setting. A special MedGuide will be given to you before each treatment. Be sure to read this information carefully each time. Talk to your care team about the use of this medication in children. Special care may be needed. Overdosage: If you think you have taken too much of this medicine contact a poison control center or emergency room at once. NOTE: This medicine is only for you. Do not share this medicine with others. What if I miss a dose? Keep appointments for follow-up doses. It is important not to miss your dose. Call your care team if you are unable to keep an appointment. What may interact with this medication? Certain antibiotics given by injection Medications for pain and inflammation, such as ibuprofen, naproxen, NSAIDs Some diuretics, such as bumetanide, furosemide Teriparatide This list may not  describe all possible interactions. Give your health care provider a list of all the medicines, herbs, non-prescription drugs, or dietary supplements you use. Also tell them if you smoke, drink alcohol, or use illegal drugs. Some items may interact with your medicine. What should I watch for while using this medication? Visit your care team for regular checks on your progress. It may be some time before you see the benefit from this medication. Some people who take this medication have severe bone, joint, or muscle pain. This medication may also increase your risk for jaw problems or a broken thigh bone. Tell your care team right away if you have severe pain in your jaw, bones, joints, or muscles. Tell your care team if you have any pain that does not go away or that gets worse. You should make sure you get enough calcium and vitamin D while you are taking this medication. Discuss the foods you eat and the vitamins you take with your care team. You may need bloodwork while taking this medication. Tell your dentist and dental surgeon that you are taking this medication. You should not have major dental surgery while on this medication. See your dentist to have a dental exam and fix any dental problems before starting this medication. Take good care of your teeth while on this medication. Make sure you see your dentist for regular follow-up appointments. What side effects may I notice from receiving this medication? Side effects that you should report to your care team as soon as possible: Allergic reactions--skin rash, itching, hives, swelling of the face, lips, tongue, or throat Kidney injury--decrease in the amount of urine,   swelling of the ankles, hands, or feet Low calcium level--muscle pain or cramps, confusion, tingling, or numbness in the hands or feet Osteonecrosis of the jaw--pain, swelling, or redness in the mouth, numbness of the jaw, poor healing after dental work, unusual discharge from the  mouth, visible bones in the mouth Severe bone, joint, or muscle pain Side effects that usually do not require medical attention (report to your care team if they continue or are bothersome): Diarrhea Dizziness Headache Nausea Stomach pain Vomiting This list may not describe all possible side effects. Call your doctor for medical advice about side effects. You may report side effects to FDA at 1-800-FDA-1088. Where should I keep my medication? This medication is given in a hospital or clinic. It will not be stored at home. NOTE: This sheet is a summary. It may not cover all possible information. If you have questions about this medicine, talk to your doctor, pharmacist, or health care provider.  2023 Elsevier/Gold Standard (2021-10-12 00:00:00)  Back Exercises The following exercises strengthen the muscles that help to support the trunk (torso) and back. They also help to keep the lower back flexible. Doing these exercises can help to prevent or lessen existing low back pain. If you have back pain or discomfort, try doing these exercises 2-3 times each day or as told by your health care provider. As your pain improves, do them once each day, but increase the number of times that you repeat the steps for each exercise (do more repetitions). To prevent the recurrence of back pain, continue to do these exercises once each day or as told by your health care provider. Do exercises exactly as told by your health care provider and adjust them as directed. It is normal to feel mild stretching, pulling, tightness, or discomfort as you do these exercises, but you should stop right away if you feel sudden pain or your pain gets worse. Exercises Single knee to chest Repeat these steps 3-5 times for each leg: Lie on your back on a firm bed or the floor with your legs extended. Bring one knee to your chest. Your other leg should stay extended and in contact with the floor. Hold your knee in place by  grabbing your knee or thigh with both hands and hold. Pull on your knee until you feel a gentle stretch in your lower back or buttocks. Hold the stretch for 10-30 seconds. Slowly release and straighten your leg.  Pelvic tilt Repeat these steps 5-10 times: Lie on your back on a firm bed or the floor with your legs extended. Bend your knees so they are pointing toward the ceiling and your feet are flat on the floor. Tighten your lower abdominal muscles to press your lower back against the floor. This motion will tilt your pelvis so your tailbone points up toward the ceiling instead of pointing to your feet or the floor. With gentle tension and even breathing, hold this position for 5-10 seconds.  Cat-cow Repeat these steps until your lower back becomes more flexible: Get into a hands-and-knees position on a firm bed or the floor. Keep your hands under your shoulders, and keep your knees under your hips. You may place padding under your knees for comfort. Let your head hang down toward your chest. Contract your abdominal muscles and point your tailbone toward the floor so your lower back becomes rounded like the back of a cat. Hold this position for 5 seconds. Slowly lift your head, let your abdominal muscles relax,   and point your tailbone up toward the ceiling so your back forms a sagging arch like the back of a cow. Hold this position for 5 seconds.  Press-ups Repeat these steps 5-10 times: Lie on your abdomen (face-down) on a firm bed or the floor. Place your palms near your head, about shoulder-width apart. Keeping your back as relaxed as possible and keeping your hips on the floor, slowly straighten your arms to raise the top half of your body and lift your shoulders. Do not use your back muscles to raise your upper torso. You may adjust the placement of your hands to make yourself more comfortable. Hold this position for 5 seconds while you keep your back relaxed. Slowly return to lying  flat on the floor.  Bridges Repeat these steps 10 times: Lie on your back on a firm bed or the floor. Bend your knees so they are pointing toward the ceiling and your feet are flat on the floor. Your arms should be flat at your sides, next to your body. Tighten your buttocks muscles and lift your buttocks off the floor until your waist is at almost the same height as your knees. You should feel the muscles working in your buttocks and the back of your thighs. If you do not feel these muscles, slide your feet 1-2 inches (2.5-5 cm) farther away from your buttocks. Hold this position for 3-5 seconds. Slowly lower your hips to the starting position, and allow your buttocks muscles to relax completely. If this exercise is too easy, try doing it with your arms crossed over your chest. Abdominal crunches Repeat these steps 5-10 times: Lie on your back on a firm bed or the floor with your legs extended. Bend your knees so they are pointing toward the ceiling and your feet are flat on the floor. Cross your arms over your chest. Tip your chin slightly toward your chest without bending your neck. Tighten your abdominal muscles and slowly raise your torso high enough to lift your shoulder blades a tiny bit off the floor. Avoid raising your torso higher than that because it can put too much stress on your lower back and does not help to strengthen your abdominal muscles. Slowly return to your starting position.  Back lifts Repeat these steps 5-10 times: Lie on your abdomen (face-down) with your arms at your sides, and rest your forehead on the floor. Tighten the muscles in your legs and your buttocks. Slowly lift your chest off the floor while you keep your hips pressed to the floor. Keep the back of your head in line with the curve in your back. Your eyes should be looking at the floor. Hold this position for 3-5 seconds. Slowly return to your starting position.  Contact a health care provider  if: Your back pain or discomfort gets much worse when you do an exercise. Your worsening back pain or discomfort does not lessen within 2 hours after you exercise. If you have any of these problems, stop doing these exercises right away. Do not do them again unless your health care provider says that you can. Get help right away if: You develop sudden, severe back pain. If this happens, stop doing the exercises right away. Do not do them again unless your health care provider says that you can. This information is not intended to replace advice given to you by your health care provider. Make sure you discuss any questions you have with your health care provider. Document Revised: 02/20/2021 Document   Reviewed: 11/08/2020 Elsevier Patient Education  2023 Elsevier Inc.  

## 2022-12-20 ENCOUNTER — Telehealth: Payer: Self-pay | Admitting: Pharmacist

## 2022-12-20 DIAGNOSIS — M818 Other osteoporosis without current pathological fracture: Secondary | ICD-10-CM

## 2022-12-20 DIAGNOSIS — Z8781 Personal history of (healed) traumatic fracture: Secondary | ICD-10-CM | POA: Insufficient documentation

## 2022-12-20 NOTE — Telephone Encounter (Addendum)
Referral for Reclast placed to [facility] [address] [facility]  Dose:5mg  IV every 12 months Premedications: Diphenhydramine 25mg  PO and acetaminophen 650mg  PO 30-45 minutes before infusion  Chesley Mires, PharmD, MPH, BCPS, CPP Clinical Pharmacist (Rheumatology and Pulmonology)  ----- Message from Ellen Henri, CMA sent at 12/19/2022  4:04 PM EDT ----- Please apply for reclast IV per Dr. Corliss Skains. Thanks!

## 2022-12-27 NOTE — Telephone Encounter (Signed)
Reclast scheduled for 01/03/23  Chesley Mires, PharmD, MPH, BCPS, CPP Clinical Pharmacist (Rheumatology and Pulmonology)

## 2023-01-02 ENCOUNTER — Ambulatory Visit: Payer: 59

## 2023-01-03 ENCOUNTER — Ambulatory Visit (INDEPENDENT_AMBULATORY_CARE_PROVIDER_SITE_OTHER): Payer: 59

## 2023-01-03 VITALS — BP 118/75 | HR 68 | Temp 97.7°F | Resp 18 | Ht 63.0 in | Wt 146.6 lb

## 2023-01-03 DIAGNOSIS — M818 Other osteoporosis without current pathological fracture: Secondary | ICD-10-CM

## 2023-01-03 DIAGNOSIS — Z8781 Personal history of (healed) traumatic fracture: Secondary | ICD-10-CM

## 2023-01-03 MED ORDER — ACETAMINOPHEN 325 MG PO TABS
650.0000 mg | ORAL_TABLET | Freq: Once | ORAL | Status: AC
  Administered 2023-01-03: 650 mg via ORAL
  Filled 2023-01-03: qty 2

## 2023-01-03 MED ORDER — DIPHENHYDRAMINE HCL 25 MG PO CAPS
25.0000 mg | ORAL_CAPSULE | Freq: Once | ORAL | Status: AC
  Administered 2023-01-03: 25 mg via ORAL
  Filled 2023-01-03: qty 1

## 2023-01-03 MED ORDER — SODIUM CHLORIDE 0.9 % IV SOLN: INTRAVENOUS | Status: DC

## 2023-01-03 MED ORDER — ZOLEDRONIC ACID 5 MG/100ML IV SOLN
5.0000 mg | Freq: Once | INTRAVENOUS | Status: AC
  Administered 2023-01-03: 5 mg via INTRAVENOUS
  Filled 2023-01-03: qty 100

## 2023-01-03 NOTE — Patient Instructions (Signed)

## 2023-01-03 NOTE — Progress Notes (Signed)
Diagnosis: Osteoporosis  Provider:  Chilton Greathouse MD  Procedure: IV Infusion  IV Type: Peripheral, IV Location: R Antecubital  Reclast (Zolendronic Acid), Dose: 5 mg  Infusion Start Time: 1544 pm  Infusion Stop Time: 1615 pm  Post Infusion IV Care: Observation period completed and Peripheral IV Discontinued  Discharge: Condition: Good, Destination: Home . AVS Provided  Performed by:  Forrest Moron, RN

## 2023-03-31 ENCOUNTER — Ambulatory Visit: Payer: 59 | Admitting: Nurse Practitioner

## 2023-03-31 ENCOUNTER — Other Ambulatory Visit: Payer: Self-pay | Admitting: Nurse Practitioner

## 2023-03-31 ENCOUNTER — Encounter: Payer: Self-pay | Admitting: Nurse Practitioner

## 2023-03-31 VITALS — BP 115/72 | HR 72 | Temp 98.7°F | Resp 20 | Ht 63.0 in | Wt 136.0 lb

## 2023-03-31 DIAGNOSIS — K219 Gastro-esophageal reflux disease without esophagitis: Secondary | ICD-10-CM | POA: Diagnosis not present

## 2023-03-31 DIAGNOSIS — F3342 Major depressive disorder, recurrent, in full remission: Secondary | ICD-10-CM

## 2023-03-31 DIAGNOSIS — E782 Mixed hyperlipidemia: Secondary | ICD-10-CM | POA: Diagnosis not present

## 2023-03-31 DIAGNOSIS — I10 Essential (primary) hypertension: Secondary | ICD-10-CM | POA: Diagnosis not present

## 2023-03-31 DIAGNOSIS — F411 Generalized anxiety disorder: Secondary | ICD-10-CM

## 2023-03-31 MED ORDER — ROSUVASTATIN CALCIUM 10 MG PO TABS: 10.0000 mg | ORAL_TABLET | Freq: Every day | ORAL | 1 refills | Status: DC

## 2023-03-31 MED ORDER — VENLAFAXINE HCL ER 75 MG PO CP24
150.0000 mg | ORAL_CAPSULE | Freq: Every day | ORAL | 1 refills | Status: DC
Start: 2023-03-31 — End: 2023-10-02

## 2023-03-31 MED ORDER — LISINOPRIL 20 MG PO TABS: 20.0000 mg | ORAL_TABLET | Freq: Every day | ORAL | 1 refills | Status: DC

## 2023-03-31 MED ORDER — ESOMEPRAZOLE MAGNESIUM 40 MG PO CPDR: 40.0000 mg | DELAYED_RELEASE_CAPSULE | Freq: Every day | ORAL | 1 refills | Status: DC

## 2023-03-31 MED ORDER — ALPRAZOLAM 0.5 MG PO TABS
0.5000 mg | ORAL_TABLET | Freq: Two times a day (BID) | ORAL | 5 refills | Status: DC | PRN
Start: 2023-03-31 — End: 2023-10-02

## 2023-03-31 NOTE — Progress Notes (Signed)
Subjective:    Patient ID: Janet Randolph, female    DOB: [birth year], 62 y.o.   MRN: [id number]   Chief Complaint: Medical Management of Chronic Issues    HPI:  Janet Randolph is a 62 y.o. who identifies as a female who was assigned female at birth.   Social history: Lives with: husband Work history: works for [employer] in today for follow up of the following chronic medical issues:  1. Primary hypertension No c/o chest pain, sob or headache. Doe snot check blood pressure at home. BP Readings from Last 3 Encounters:  03/31/23 115/72  01/03/23 118/75  12/19/22 (!) 153/84     2. Mixed hyperlipidemia Does not watch diet very closely and does no dedicated exercise. Lab Results  Component Value Date   CHOL 150 09/30/2022   HDL 51 09/30/2022   LDLCALC 79 09/30/2022   TRIG 112 09/30/2022   CHOLHDL 2.9 09/30/2022     3. Gastroesophageal reflux disease, unspecified whether esophagitis present Is on nexium daily and works well for her  4. Recurrent major depressive disorder, in full remission (HCC) Has been on Effexor for awhile and it works well for her.    03/31/2023    2:37 PM 09/30/2022    2:11 PM 03/29/2022    2:59 PM  Depression screen PHQ 2/9  Decreased Interest 1 3 2   Down, Depressed, Hopeless 0 2 1  PHQ - 2 Score 1 5 3   Altered sleeping 0 2 1  Tired, decreased energy 2 3 3   Change in appetite 2 2 3   Feeling bad or failure about yourself  1 1 1   Trouble concentrating 1 3 1   Moving slowly or fidgety/restless 0 0 0  Suicidal thoughts 0 0 0  PHQ-9 Score 7 16 12   Difficult doing work/chores Not difficult at all Somewhat difficult Not difficult at all     5. GAD (generalized anxiety disorder) Is on xanax bid. Gets anxious without taking. Has been on this for years    03/31/2023    2:37 PM 09/30/2022    2:12 PM 03/29/2022    3:00 PM 09/28/2021    2:41 PM  GAD 7 : Generalized Anxiety Score  Nervous, Anxious, on Edge 1 3 3 3   Control/stop  worrying 0 2 1 1   Worry too much - different things 0 1 1 1   Trouble relaxing 0 2 1 0  Restless 0 0 1 0  Easily annoyed or irritable 2 2 1 2   Afraid - awful might happen 0 1 1 1   Total GAD 7 Score 3 11 9 8   Anxiety Difficulty Not difficult at all Somewhat difficult Not difficult at all Somewhat difficult       New complaints: None today  Allergies  Allergen Reactions   Latex Rash   Penicillins Rash   Outpatient Encounter Medications as of 03/31/2023  Medication Sig   ALPRAZolam (XANAX) 0.5 MG tablet Take 1 tablet (0.5 mg total) by mouth 2 (two) times daily as needed.   Aspirin-Acetaminophen-Caffeine (GOODY HEADACHE PO) Take 3 Packages by mouth daily as needed (headache).   budesonide (ENTOCORT EC) 3 MG 24 hr capsule Take 3 mg by mouth daily.   CALCIUM PO Take by mouth daily.   clobetasol ointment (TEMOVATE) 0.05 % Apply 1 application topically daily. Large surface area (Patient taking differently: Apply 1 application  topically daily as needed (dry skin). Large surface area)   Cyanocobalamin (VITAMIN B-12 PO) Take by mouth daily.  desonide (DESOWEN) 0.05 % cream Apply topically 2 (two) times daily. (Patient taking differently: Apply 1 application  topically 2 (two) times daily as needed (rash).)   esomeprazole (NEXIUM) 40 MG capsule Take 1 capsule (40 mg total) by mouth daily. Take 30 min before meal   Insulin Pen Needle 31G X 5 MM MISC Use to inject Forteo once daily. DO NOT REUSE NEEDLES.   lisinopril (ZESTRIL) 20 MG tablet Take 1 tablet (20 mg total) by mouth daily.   loperamide (IMODIUM) 2 MG capsule Take 4 mg by mouth daily as needed for diarrhea or loose stools.   rosuvastatin (CRESTOR) 10 MG tablet Take 1 tablet (10 mg total) by mouth daily.   venlafaxine XR (EFFEXOR-XR) 75 MG 24 hr capsule Take 2 capsules (150 mg total) by mouth daily with breakfast.   VITAMIN D PO Take 1,000 Units by mouth daily.   [DISCONTINUED] Mesalamine (ASACOL) 400 MG CPDR DR capsule Take 400 mg by  mouth 4 (four) times daily.   No facility-administered encounter medications on file as of 03/31/2023.    Past Surgical History:  Procedure Laterality Date   HAND SURGERY Bilateral    MOLE REMOVAL  06/12/2022   TEE WITHOUT CARDIOVERSION N/A 01/02/2022   Procedure: TRANSESOPHAGEAL ECHOCARDIOGRAM (TEE);  Surgeon: Lewayne Bunting, MD;  Location: [facility] ENDOSCOPY;  Service: Cardiovascular;  Laterality: N/A;    Family History  Problem Relation Age of Onset   Melanoma Mother    Asthma Father    Crohn's disease Father    Melanoma Father    Asthma Brother    Heart Problems Brother    Stroke Brother    Healthy Son    Healthy Daughter       Controlled substance contract: n/a     Review of Systems  Constitutional:  Negative for diaphoresis.  Eyes:  Negative for pain.  Respiratory:  Negative for shortness of breath.   Cardiovascular:  Negative for chest pain, palpitations and leg swelling.  Gastrointestinal:  Negative for abdominal pain.  Endocrine: Negative for polydipsia.  Skin:  Negative for rash.  Neurological:  Negative for dizziness, weakness and headaches.  Hematological:  Does not bruise/bleed easily.  All other systems reviewed and are negative.      Objective:   Physical Exam Vitals and nursing note reviewed.  Constitutional:      General: She is not in acute distress.    Appearance: Normal appearance. She is well-developed.  HENT:     Head: Normocephalic.     Right Ear: Tympanic membrane normal.     Left Ear: Tympanic membrane normal.     Nose: Nose normal.     Mouth/Throat:     Mouth: Mucous membranes are moist.  Eyes:     Pupils: Pupils are equal, round, and reactive to light.  Neck:     Vascular: No carotid bruit or JVD.  Cardiovascular:     Rate and Rhythm: Normal rate and regular rhythm.     Heart sounds: Normal heart sounds.  Pulmonary:     Effort: Pulmonary effort is normal. No respiratory distress.     Breath sounds: Normal breath sounds. No  wheezing or rales.  Chest:     Chest wall: No tenderness.  Abdominal:     General: Bowel sounds are normal. There is no distension or abdominal bruit.     Palpations: Abdomen is soft. There is no hepatomegaly, splenomegaly, mass or pulsatile mass.     Tenderness: There is no abdominal tenderness.  Musculoskeletal:        General: Normal range of motion.     Cervical back: Normal range of motion and neck supple.  Lymphadenopathy:     Cervical: No cervical adenopathy.  Skin:    General: Skin is warm and dry.  Neurological:     Mental Status: She is alert and oriented to person, place, and time.     Deep Tendon Reflexes: Reflexes are normal and symmetric.  Psychiatric:        Behavior: Behavior normal.        Thought Content: Thought content normal.        Judgment: Judgment normal.    BP 115/72   Pulse 72   Temp 98.7 F (37.1 C) (Temporal)   Resp 20   Ht 5\' 3"  (1.6 m)   Wt 136 lb (61.7 kg)   SpO2 95%   BMI 24.09 kg/m         Assessment & Plan:   Janet Randolph comes in today with chief complaint of Medical Management of Chronic Issues   Diagnosis and orders addressed:  1. Primary hypertension Low sodium diet - lisinopril (ZESTRIL) 20 MG tablet; Take 1 tablet (20 mg total) by mouth daily.  Dispense: 90 tablet; Refill: 1  2. Mixed hyperlipidemia Low fat diet - rosuvastatin (CRESTOR) 10 MG tablet; Take 1 tablet (10 mg total) by mouth daily.  Dispense: 90 tablet; Refill: 1  3. Gastroesophageal reflux disease, unspecified whether esophagitis present Avoid spicy foods Do not eat 2 hours prior to bedtime - esomeprazole (NEXIUM) 40 MG capsule; Take 1 capsule (40 mg total) by mouth daily. Take 30 min before meal  Dispense: 90 capsule; Refill: 1  4. Recurrent major depressive disorder, in full remission (HCC) Stress management - venlafaxine XR (EFFEXOR-XR) 75 MG 24 hr capsule; Take 2 capsules (150 mg total) by mouth daily with breakfast.  Dispense: 180 capsule;  Refill: 1  5. GAD (generalized anxiety disorder)  - ALPRAZolam (XANAX) 0.5 MG tablet; Take 1 tablet (0.5 mg total) by mouth 2 (two) times daily as needed.  Dispense: 60 tablet; Refill: 5   Labs pending Health Maintenance reviewed Diet and exercise encouraged  Follow up plan: 6 months   Mary-Margaret Daphine Deutscher, FNP

## 2023-03-31 NOTE — Patient Instructions (Signed)

## 2023-04-01 LAB — CMP14+EGFR
ALT: 8 IU/L (ref 0–32)
AST: 18 IU/L (ref 0–40)
Albumin: 4.2 g/dL (ref 3.9–4.9)
Alkaline Phosphatase: 47 IU/L (ref 44–121)
BUN/Creatinine Ratio: 32 — ABNORMAL HIGH (ref 12–28)
BUN: 19 mg/dL (ref 8–27)
Bilirubin Total: <0.2 mg/dL (ref 0.0–1.2)
CO2: 20 mmol/L (ref 20–29)
Calcium: 8.9 mg/dL (ref 8.7–10.3)
Chloride: 105 mmol/L (ref 96–106)
Creatinine, Ser: 0.6 mg/dL (ref 0.57–1.00)
Globulin, Total: 2 g/dL (ref 1.5–4.5)
Glucose: 80 mg/dL (ref 70–99)
Potassium: 4.3 mmol/L (ref 3.5–5.2)
Sodium: 141 mmol/L (ref 134–144)
Total Protein: 6.2 g/dL (ref 6.0–8.5)
eGFR: 101 mL/min/{1.73_m2} (ref >59)

## 2023-04-01 LAB — CBC WITH DIFFERENTIAL/PLATELET
Basophils Absolute: 0 10*3/uL (ref 0.0–0.2)
Basos: 1 %
EOS (ABSOLUTE): 0.1 10*3/uL (ref 0.0–0.4)
Eos: 1 %
Hematocrit: 36 % (ref 34.0–46.6)
Hemoglobin: 11.8 g/dL (ref 11.1–15.9)
Immature Grans (Abs): 0 10*3/uL (ref 0.0–0.1)
Immature Granulocytes: 0 %
Lymphocytes Absolute: 1.4 10*3/uL (ref 0.7–3.1)
Lymphs: 24 %
MCH: 32.2 pg (ref 26.6–33.0)
MCHC: 32.8 g/dL (ref 31.5–35.7)
MCV: 98 fL — ABNORMAL HIGH (ref 79–97)
Monocytes Absolute: 0.4 10*3/uL (ref 0.1–0.9)
Monocytes: 7 %
Neutrophils Absolute: 4 10*3/uL (ref 1.4–7.0)
Neutrophils: 67 %
Platelets: 362 10*3/uL (ref 150–450)
RBC: 3.66 x10E6/uL — ABNORMAL LOW (ref 3.77–5.28)
RDW: 13.5 % (ref 11.7–15.4)
WBC: 5.8 10*3/uL (ref 3.4–10.8)

## 2023-04-01 LAB — LIPID PANEL
Chol/HDL Ratio: 3.1 ratio (ref 0.0–4.4)
Cholesterol, Total: 160 mg/dL (ref 100–199)
HDL: 51 mg/dL (ref >39)
LDL Chol Calc (NIH): 92 mg/dL (ref 0–99)
Triglycerides: 94 mg/dL (ref 0–149)
VLDL Cholesterol Cal: 17 mg/dL (ref 5–40)

## 2023-05-05 ENCOUNTER — Other Ambulatory Visit: Payer: Self-pay | Admitting: Family Medicine

## 2023-05-05 DIAGNOSIS — E041 Nontoxic single thyroid nodule: Secondary | ICD-10-CM

## 2023-05-05 DIAGNOSIS — F17200 Nicotine dependence, unspecified, uncomplicated: Secondary | ICD-10-CM

## 2023-05-05 NOTE — Progress Notes (Signed)
>  20pack year smoker w/ worsening SOB.  L sided thyroid nodule. H/o thyroid ablation several years ago.

## 2023-05-14 ENCOUNTER — Ambulatory Visit
Admission: RE | Admit: 2023-05-14 | Discharge: 2023-05-14 | Disposition: A | Discharge status: Home / Self Care | Payer: 59 | Source: Ambulatory Visit | Attending: Family Medicine | Admitting: Family Medicine

## 2023-05-14 DIAGNOSIS — E041 Nontoxic single thyroid nodule: Secondary | ICD-10-CM

## 2023-05-27 ENCOUNTER — Ambulatory Visit
Admission: RE | Admit: 2023-05-27 | Discharge: 2023-05-27 | Disposition: A | Discharge status: Home / Self Care | Payer: 59 | Source: Ambulatory Visit | Attending: Family Medicine | Admitting: Family Medicine

## 2023-05-27 DIAGNOSIS — F17200 Nicotine dependence, unspecified, uncomplicated: Secondary | ICD-10-CM

## 2023-06-11 NOTE — Progress Notes (Signed)
Office Visit Note  Patient: Janet Randolph             Date of Birth: 1961-02-19           MRN: [id number]             PCP: Bennie Pierini, FNP Referring: Bennie Pierini, * Visit Date: 06/24/2023 Occupation: @GUAROCC @  Subjective:  Medication management  History of Present Illness: Janet Randolph is a 62 y.o. female with osteoporosis, osteoarthritis and degenerative disc disease.  She took Forteo for couple of years intermittently and then had Reclast infusion in April 2024.  As she had no side effects from Reclast infusion.  She has been taking calcium and vitamin D and has been walking.  She is not doing any resistive exercises yet.  She has intermittent discomfort in her hands but no joint swelling.  She continues to have lower back pain.    Activities of Daily Living:  Patient reports morning stiffness for 0 minutes.   Patient Denies nocturnal pain.  Difficulty dressing/grooming: Denies Difficulty climbing stairs: Reports Difficulty getting out of chair: Denies Difficulty using hands for taps, buttons, cutlery, and/or writing: Reports  Review of Systems  Constitutional:  Positive for fatigue.  HENT:  Positive for mouth dryness. Negative for mouth sores.   Eyes:  Negative for dryness.  Respiratory:  Positive for shortness of breath and wheezing.   Cardiovascular:  Negative for chest pain and palpitations.  Gastrointestinal:  Negative for blood in stool, constipation and diarrhea.  Endocrine: Negative for increased urination.  Genitourinary:  Negative for involuntary urination.  Musculoskeletal:  Positive for joint pain and joint pain. Negative for gait problem, joint swelling, myalgias, muscle weakness, morning stiffness, muscle tenderness and myalgias.  Skin:  Negative for color change, rash, hair loss and sensitivity to sunlight.  Allergic/Immunologic: Positive for susceptible to infections.  Neurological:  Positive for headaches. Negative for dizziness  and numbness.  Hematological:  Negative for swollen glands.  Psychiatric/Behavioral:  Positive for depressed mood. Negative for sleep disturbance. The patient is nervous/anxious.     PMFS History:  Patient Active Problem List   Diagnosis Date Noted   Age-related osteoporosis without fracture 12/20/2022   History of wrist fracture 12/20/2022   GERD (gastroesophageal reflux disease) 04/03/2016   Eczema of both hands 07/12/2015   GAD (generalized anxiety disorder) 06/04/2013   Hyperlipidemia 03/05/2013   Hypertension 03/05/2013   Depression 02/09/2013    Past Medical History:  Diagnosis Date   Depression    Eczema    Hyperlipidemia    Hypertension    Spinal stenosis 2024   Vitamin D deficiency     Family History  Problem Relation Age of Onset   Melanoma Mother    Asthma Father    Crohn's disease Father    Melanoma Father    Asthma Brother    Heart Problems Brother    Stroke Brother    Healthy Son    Healthy Daughter    Past Surgical History:  Procedure Laterality Date   HAND SURGERY Bilateral    MOLE REMOVAL  06/12/2022   TEE WITHOUT CARDIOVERSION N/A 01/02/2022   Procedure: TRANSESOPHAGEAL ECHOCARDIOGRAM (TEE);  Surgeon: Lewayne Bunting, MD;  Location: [facility] ENDOSCOPY;  Service: Cardiovascular;  Laterality: N/A;   Social History   Social History Narrative   Not on file   Immunization History  Administered Date(s) Administered   PFIZER(Purple Top)SARS-COV-2 Vaccination 11/25/2019, 12/20/2019, 08/29/2020   Tdap 07/23/2010, 07/20/2021   Zoster Recombinant(Shingrix) 09/28/2021,  09/30/2022     Objective: Vital Signs: BP (!) 152/87 (BP Location: Left Arm, Patient Position: Sitting, Cuff Size: Normal)   Pulse 71   Resp 17   Ht 5\' 3"  (1.6 m)   Wt 134 lb 6.4 oz (61 kg)   BMI 23.81 kg/m    Physical Exam Vitals and nursing note reviewed.  Constitutional:      Appearance: She is well-developed.  HENT:     Head: Normocephalic and atraumatic.  Eyes:      Conjunctiva/sclera: Conjunctivae normal.  Cardiovascular:     Rate and Rhythm: Normal rate and regular rhythm.     Heart sounds: Normal heart sounds.  Pulmonary:     Effort: Pulmonary effort is normal.     Breath sounds: Normal breath sounds.  Abdominal:     General: Bowel sounds are normal.     Palpations: Abdomen is soft.  Musculoskeletal:     Cervical back: Normal range of motion.  Lymphadenopathy:     Cervical: No cervical adenopathy.  Skin:    General: Skin is warm and dry.     Capillary Refill: Capillary refill takes less than 2 seconds.  Neurological:     Mental Status: She is alert and oriented to person, place, and time.  Psychiatric:        Behavior: Behavior normal.      Musculoskeletal Exam: Cervical, thoracic and lumbar spine 1 good range of motion.  Thoracic postural kyphosis was noted.  Shoulders, elbows, wrist joints, MCPs PIPs and DIPs with good range of motion.  She had bilateral PIP and DIP thickening with no synovitis.  Hip joints and knee joints in good range of motion.  There was no tenderness over ankles or MTPs.  CDAI Exam: CDAI Score: -- Patient Global: --; Provider Global: -- Swollen: --; Tender: -- Joint Exam 06/24/2023   No joint exam has been documented for this visit   There is currently no information documented on the homunculus. Go to the Rheumatology activity and complete the homunculus joint exam.  Investigation: No additional findings.  Imaging: CT CHEST LUNG CA SCREEN LOW DOSE W/O CM  Addendum Date: 06/10/2023   ADDENDUM REPORT: 06/10/2023 08:30 ADDENDUM: 3.3 cm left thyroid nodule shows no substantial interval change. This has been evaluated on previous imaging. (ref: J Am Coll Radiol. 2015 Feb;12(2): 143-50). Electronically Signed   By: Kennith Center M.D.   On: 06/10/2023 08:30   Result Date: 06/10/2023 CLINICAL DATA:  62 year old female with 32 pack-year history of smoking. Lung cancer screening. EXAM: CT CHEST WITHOUT CONTRAST  LOW-DOSE FOR LUNG CANCER SCREENING TECHNIQUE: Multidetector CT imaging of the chest was performed following the standard protocol without IV contrast. RADIATION DOSE REDUCTION: This exam was performed according to the departmental dose-optimization program which includes automated exposure control, adjustment of the mA and/or kV according to patient size and/or use of iterative reconstruction technique. COMPARISON:  09/06/2021 FINDINGS: Cardiovascular: The heart size is normal. No substantial pericardial effusion. Mild atherosclerotic calcification is noted in the wall of the thoracic aorta. Mediastinum/Nodes: No mediastinal lymphadenopathy. No evidence for gross hilar lymphadenopathy although assessment is limited by the lack of intravenous contrast on the current study. The esophagus has normal imaging features. There is no axillary lymphadenopathy. Lungs/Pleura: Centrilobular emphsyema noted. Tiny subpleural nodules right upper lobe are unchanged. No new suspicious pulmonary nodule or mass. No focal airspace consolidation. No pleural effusion. Upper Abdomen: Stable small left adrenal adenoma. Otherwise unremarkable. Musculoskeletal: No worrisome lytic or sclerotic osseous abnormality. IMPRESSION:  1. Lung-RADS 2, benign appearance or behavior. Continue annual screening with low-dose chest CT without contrast in 12 months. 2. Stable small left adrenal adenoma. 3. Aortic Atherosclerosis (ICD10-I70.0) and Emphysema (ICD10-J43.9). 1. Electronically Signed: By: Kennith Center M.D. On: 06/10/2023 08:17    Recent Labs: Lab Results  Component Value Date   WBC 5.8 03/31/2023   HGB 11.8 03/31/2023   PLT 362 03/31/2023   NA 141 03/31/2023   K 4.3 03/31/2023   CL 105 03/31/2023   CO2 20 03/31/2023   GLUCOSE 80 03/31/2023   BUN 19 03/31/2023   CREATININE 0.60 03/31/2023   BILITOT <0.2 03/31/2023   ALKPHOS 47 03/31/2023   AST 18 03/31/2023   ALT 8 03/31/2023   PROT 6.2 03/31/2023   ALBUMIN 4.2 03/31/2023    CALCIUM 8.9 03/31/2023   GFRAA 112 02/22/2020    Speciality Comments: Forteo 05/23-02/24-took 50% of the time  Procedures:  No procedures performed Allergies: Latex and Penicillins   Assessment / Plan:     Visit Diagnoses: Other osteoporosis without current pathological fracture - DEXA 09/06/21:LFN BMD 0.491 T-score -3.2. h/o bilateral wrist fracture, history of reflux and she is a smoker. Forteo: 01/29/2022 intermittently through 11/2022.  She had Reclast infusion on January 03, 2023 which she tolerated well without any side effects.  She has been taking calcium and vitamin D.  She has been walking for exercise.  Resistive exercises were emphasized.  Patient plans to start doing yoga.  Will schedule repeat DEXA scan.  Medication monitoring encounter - Reclast IV on 01/03/2023.  March 31, 2023 CBC and CMP were normal.  Vitamin D was normal on June 17, 2022 at 40.  History of wrist fracture - Left wrist fracture in 2020 and right wrist fracture in 2021 after a fall.  She has no discomfort in her wrist joints now.  Vitamin D deficiency-patient has been taking vitamin D on a regular basis.  Primary osteoarthritis of both hands-he complains of pain and discomfort in the bilateral hands.  No synovitis was noted.  A handout on hand exercises was given.  Postural kyphosis of thoracic region-exercises were demonstrated in the office.  Degeneration of intervertebral disc of lumbar region, unspecified whether pain present -she continues to have intermittent lower back pain.  Patient was evaluated at [facility] and was diagnosed with degenerative disc disease based on the x-rays and MRI. Patient states she also has a spinal stenosis.  A handout on back exercises was given.  Other medical problems are listed as follows:  Eczema of both hands  Primary hypertension-blood pressure was elevated at 152/87.  Patient was advised to monitor blood pressure closely and follow-up with her  PCP.  Murmur  History of gastroesophageal reflux (GERD)  Mixed hyperlipidemia  History of colitis  History of emphysema (HCC)  Anxiety and depression  Thyroid nodule  Smoker - She had a CT chest lung cancer screening on September 07, 2021.  Orders: Orders Placed This Encounter  Procedures   DG BONE DENSITY (DXA)   No orders of the defined types were placed in this encounter.   Follow-Up Instructions: Return in about 6 months (around 12/23/2023) for Osteoporosis, Osteoarthritis.   Janet Savoy, MD  Note - This record has been created using Animal nutritionist.  Chart creation errors have been sought, but may not always  have been located. Such creation errors do not reflect on  the standard of medical care.

## 2023-06-24 ENCOUNTER — Ambulatory Visit: Payer: 59 | Attending: Rheumatology | Admitting: Rheumatology

## 2023-06-24 ENCOUNTER — Encounter: Payer: Self-pay | Admitting: Rheumatology

## 2023-06-24 VITALS — BP 152/87 | HR 71 | Resp 17 | Ht 63.0 in | Wt 134.4 lb

## 2023-06-24 DIAGNOSIS — I10 Essential (primary) hypertension: Secondary | ICD-10-CM

## 2023-06-24 DIAGNOSIS — M51369 Other intervertebral disc degeneration, lumbar region without mention of lumbar back pain or lower extremity pain: Secondary | ICD-10-CM

## 2023-06-24 DIAGNOSIS — Z8719 Personal history of other diseases of the digestive system: Secondary | ICD-10-CM

## 2023-06-24 DIAGNOSIS — L309 Dermatitis, unspecified: Secondary | ICD-10-CM

## 2023-06-24 DIAGNOSIS — M4004 Postural kyphosis, thoracic region: Secondary | ICD-10-CM

## 2023-06-24 DIAGNOSIS — Z5181 Encounter for therapeutic drug level monitoring: Secondary | ICD-10-CM

## 2023-06-24 DIAGNOSIS — M818 Other osteoporosis without current pathological fracture: Secondary | ICD-10-CM | POA: Diagnosis not present

## 2023-06-24 DIAGNOSIS — F17200 Nicotine dependence, unspecified, uncomplicated: Secondary | ICD-10-CM

## 2023-06-24 DIAGNOSIS — J439 Emphysema, unspecified: Secondary | ICD-10-CM

## 2023-06-24 DIAGNOSIS — M19041 Primary osteoarthritis, right hand: Secondary | ICD-10-CM

## 2023-06-24 DIAGNOSIS — R011 Cardiac murmur, unspecified: Secondary | ICD-10-CM

## 2023-06-24 DIAGNOSIS — Z8781 Personal history of (healed) traumatic fracture: Secondary | ICD-10-CM | POA: Diagnosis not present

## 2023-06-24 DIAGNOSIS — E559 Vitamin D deficiency, unspecified: Secondary | ICD-10-CM

## 2023-06-24 DIAGNOSIS — F32A Depression, unspecified: Secondary | ICD-10-CM

## 2023-06-24 DIAGNOSIS — F419 Anxiety disorder, unspecified: Secondary | ICD-10-CM

## 2023-06-24 DIAGNOSIS — M19042 Primary osteoarthritis, left hand: Secondary | ICD-10-CM

## 2023-06-24 DIAGNOSIS — E041 Nontoxic single thyroid nodule: Secondary | ICD-10-CM

## 2023-06-24 DIAGNOSIS — E782 Mixed hyperlipidemia: Secondary | ICD-10-CM

## 2023-06-24 NOTE — Patient Instructions (Signed)
Osteoporosis  Osteoporosis is when the bones get thin and weak. This can cause your bones to break (fracture) more easily. What are the causes? The exact cause of this condition is not known. What increases the risk? Having family members with this condition. Not eating enough healthy foods. Taking certain medicines. Being female. Being age 64 or older. Smoking or using other products that contain nicotine or tobacco, such as e-cigarettes or chewing tobacco. Not exercising. Being of European or Asian ancestry. Having a small body frame. What are the signs or symptoms? A broken bone might be the first sign, especially if the break results from a fall or injury that usually would not cause a bone to break. Other signs and symptoms include: Pain in the neck or low back. Being hunched over (stooped posture). Getting shorter. How is this treated? Eating more foods with more calcium and vitamin D in them. Doing exercises. Stopping tobacco use. Limiting how much alcohol you drink. Taking medicines to slow bone loss or help make the bones stronger. Taking supplements of calcium and vitamin D every day. Taking medicines to replace chemicals in the body (hormone replacement medicines). Monitoring your levels of calcium and vitamin D. The goal of treatment is to strengthen your bones and lower your risk for a bone break. Follow these instructions at home: Eating and drinking Eat plenty of calcium and vitamin D. These nutrients are good for your bones. Good sources of calcium and vitamin D include: Some fish, such as salmon and tuna. Foods that have calcium and vitamin D added to them (fortified foods), such as some breakfast cereals. Egg yolks. Cheese. Liver.  Activity Do exercises as told by your doctor. Ask your doctor what exercises are safe for you. You should do: Exercises that make your muscles work to hold your body weight up (weight-bearing exercises). These include tai chi,  yoga, and walking. Exercises to make your muscles stronger. One example is lifting weights. Lifestyle Do not drink alcohol if: Your doctor tells you not to drink. You are pregnant, may be pregnant, or are planning to become pregnant. If you drink alcohol: Limit how much you use to: 0-1 drink a day for women. 0-2 drinks a day for men. Know how much alcohol is in your drink. In the [place] one drink equals one 12 oz bottle of beer (355 mL), one 5 oz glass of wine (148 mL), or one 1 oz glass of hard liquor (44 mL). Do not smoke or use any products that contain nicotine or tobacco. If you need help quitting, ask your doctor. Preventing falls Use tools to help you move around (mobility aids) as needed. These include canes, walkers, scooters, and crutches. Keep rooms well-lit. Put away things on the floor that could make you trip. These include cords and rugs. Install safety rails on stairs. Install grab bars in bathrooms. Use rubber mats in slippery areas, like bathrooms. Wear shoes that: Fit you well. Support your feet. Have closed toes. Have rubber soles or low heels. Tell your doctor about all of the medicines you are taking. Some medicines can make you more likely to fall. General instructions Take over-the-counter and prescription medicines only as told by your doctor. Keep all follow-up visits. Contact a doctor if: You have not been tested (screened) for osteoporosis and you are: A woman who is age 70 or older. A man who is age 77 or older. Get help right away if: You fall. You get hurt. Summary Osteoporosis happens when your  bones get thin and weak. Weak bones can break (fracture) more easily. Eat plenty of calcium and vitamin D. These are good for your bones. Tell your doctor about all of the medicines that you take. This information is not intended to replace advice given to you by your health care provider. Make sure you discuss any questions you have with your health care  provider. Document Revised: 02/10/2020 Document Reviewed: 02/10/2020 [employer] Patient Education  2024 [employer] Hand Exercises Hand exercises can be helpful for almost anyone. They can strengthen your hands and improve flexibility and movement. The exercises can also increase blood flow to the hands. These results can make your work and daily tasks easier for you. Hand exercises can be especially helpful for people who have joint pain from arthritis or nerve damage from using their hands over and over. These exercises can also help people who injure a hand. Exercises Most of these hand exercises are gentle stretching and motion exercises. It is usually safe to do them often throughout the day. Warming up your hands before exercise may help reduce stiffness. You can do this with gentle massage or by placing your hands in warm water for 10-15 minutes. It is normal to feel some stretching, pulling, tightness, or mild discomfort when you begin new exercises. In time, this will improve. Remember to always be careful and stop right away if you feel sudden, very bad pain or your pain gets worse. You want to get better and be safe. Ask your health care provider which exercises are safe for you. Do exercises exactly as told by your provider and adjust them as told. Do not begin these exercises until told by your provider. Knuckle bend or "claw" fist  Stand or sit with your arm, hand, and all five fingers pointed straight up. Make sure to keep your wrist straight. Gently bend your fingers down toward your palm until the tips of your fingers are touching your palm. Keep your big knuckle straight and only bend the small knuckles in your fingers. Hold this position for 10 seconds. Straighten your fingers back to your starting position. Repeat this exercise 5-10 times with each hand. Full finger fist  Stand or sit with your arm, hand, and all five fingers pointed straight up. Make sure to keep your wrist  straight. Gently bend your fingers into your palm until the tips of your fingers are touching the middle of your palm. Hold this position for 10 seconds. Extend your fingers back to your starting position, stretching every joint fully. Repeat this exercise 5-10 times with each hand. Straight fist  Stand or sit with your arm, hand, and all five fingers pointed straight up. Make sure to keep your wrist straight. Gently bend your fingers at the big knuckle, where your fingers meet your hand, and at the middle knuckle. Keep the knuckle at the tips of your fingers straight and try to touch the bottom of your palm. Hold this position for 10 seconds. Extend your fingers back to your starting position, stretching every joint fully. Repeat this exercise 5-10 times with each hand. Tabletop  Stand or sit with your arm, hand, and all five fingers pointed straight up. Make sure to keep your wrist straight. Gently bend your fingers at the big knuckle, where your fingers meet your hand, as far down as you can. Keep the small knuckles in your fingers straight. Think of forming a tabletop with your fingers. Hold this position for 10 seconds. Extend your fingers  back to your starting position, stretching every joint fully. Repeat this exercise 5-10 times with each hand. Finger spread  Place your hand flat on a table with your palm facing down. Make sure your wrist stays straight. Spread your fingers and thumb apart from each other as far as you can until you feel a gentle stretch. Hold this position for 10 seconds. Bring your fingers and thumb tight together again. Hold this position for 10 seconds. Repeat this exercise 5-10 times with each hand. Making circles  Stand or sit with your arm, hand, and all five fingers pointed straight up. Make sure to keep your wrist straight. Make a circle by touching the tip of your thumb to the tip of your index finger. Hold for 10 seconds. Then open your hand  wide. Repeat this motion with your thumb and each of your fingers. Repeat this exercise 5-10 times with each hand. Thumb motion  Sit with your forearm resting on a table and your wrist straight. Your thumb should be facing up toward the ceiling. Keep your fingers relaxed as you move your thumb. Lift your thumb up as high as you can toward the ceiling. Hold for 10 seconds. Bend your thumb across your palm as far as you can, reaching the tip of your thumb for the small finger (pinkie) side of your palm. Hold for 10 seconds. Repeat this exercise 5-10 times with each hand. Grip strengthening  Hold a stress ball or other soft ball in the middle of your hand. Slowly increase the pressure, squeezing the ball as much as you can without causing pain. Think of bringing the tips of your fingers into the middle of your palm. All of your finger joints should bend when doing this exercise. Hold your squeeze for 10 seconds, then relax. Repeat this exercise 5-10 times with each hand. Contact a health care provider if: Your hand pain or discomfort gets much worse when you do an exercise. Your hand pain or discomfort does not improve within 2 hours after you exercise. If you have either of these problems, stop doing these exercises right away. Do not do them again unless your provider says that you can. Get help right away if: You develop sudden, severe hand pain or swelling. If this happens, stop doing these exercises right away. Do not do them again unless your provider says that you can. This information is not intended to replace advice given to you by your health care provider. Make sure you discuss any questions you have with your health care provider. Document Revised: 09/10/2022 Document Reviewed: 09/10/2022 [employer] Patient Education  2024 [employer] Low Back Sprain or Strain Rehab Ask your health care provider which exercises are safe for you. Do exercises exactly as told by your health care  provider and adjust them as directed. It is normal to feel mild stretching, pulling, tightness, or discomfort as you do these exercises. Stop right away if you feel sudden pain or your pain gets worse. Do not begin these exercises until told by your health care provider. Stretching and range-of-motion exercises These exercises warm up your muscles and joints and improve the movement and flexibility of your back. These exercises also help to relieve pain, numbness, and tingling. Lumbar rotation  Lie on your back on a firm bed or the floor with your knees bent. Straighten your arms out to your sides so each arm forms a 90-degree angle (right angle) with a side of your body. Slowly move (rotate) both of  your knees to one side of your body until you feel a stretch in your lower back (lumbar). Try not to let your shoulders lift off the floor. Hold this position for __________ seconds. Tense your abdominal muscles and slowly move your knees back to the starting position. Repeat this exercise on the other side of your body. Repeat __________ times. Complete this exercise __________ times a day. Single knee to chest  Lie on your back on a firm bed or the floor with both legs straight. Bend one of your knees. Use your hands to move your knee up toward your chest until you feel a gentle stretch in your lower back and buttock. Hold your leg in this position by holding on to the front of your knee. Keep your other leg as straight as possible. Hold this position for __________ seconds. Slowly return to the starting position. Repeat with your other leg. Repeat __________ times. Complete this exercise __________ times a day. Prone extension on elbows  Lie on your abdomen on a firm bed or the floor (prone position). Prop yourself up on your elbows. Use your arms to help lift your chest up until you feel a gentle stretch in your abdomen and your lower back. This will place some of your body weight on your  elbows. If this is uncomfortable, try stacking pillows under your chest. Your hips should stay down, against the surface that you are lying on. Keep your hip and back muscles relaxed. Hold this position for __________ seconds. Slowly relax your upper body and return to the starting position. Repeat __________ times. Complete this exercise __________ times a day. Strengthening exercises These exercises build strength and endurance in your back. Endurance is the ability to use your muscles for a long time, even after they get tired. Pelvic tilt This exercise strengthens the muscles that lie deep in the abdomen. Lie on your back on a firm bed or the floor with your legs extended. Bend your knees so they are pointing toward the ceiling and your feet are flat on the floor. Tighten your lower abdominal muscles to press your lower back against the floor. This motion will tilt your pelvis so your tailbone points up toward the ceiling instead of pointing to your feet or the floor. To help with this exercise, you may place a small towel under your lower back and try to push your back into the towel. Hold this position for __________ seconds. Let your muscles relax completely before you repeat this exercise. Repeat __________ times. Complete this exercise __________ times a day. Alternating arm and leg raises  Get on your hands and knees on a firm surface. If you are on a hard floor, you may want to use padding, such as an exercise mat, to cushion your knees. Line up your arms and legs. Your hands should be directly below your shoulders, and your knees should be directly below your hips. Lift your left leg behind you. At the same time, raise your right arm and straighten it in front of you. Do not lift your leg higher than your hip. Do not lift your arm higher than your shoulder. Keep your abdominal and back muscles tight. Keep your hips facing the ground. Do not arch your back. Keep your balance  carefully, and do not hold your breath. Hold this position for __________ seconds. Slowly return to the starting position. Repeat with your right leg and your left arm. Repeat __________ times. Complete this exercise __________ times a  day. Abdominal set with straight leg raise  Lie on your back on a firm bed or the floor. Bend one of your knees and keep your other leg straight. Tense your abdominal muscles and lift your straight leg up, 4-6 inches (10-15 cm) off the ground. Keep your abdominal muscles tight and hold this position for __________ seconds. Do not hold your breath. Do not arch your back. Keep it flat against the ground. Keep your abdominal muscles tense as you slowly lower your leg back to the starting position. Repeat with your other leg. Repeat __________ times. Complete this exercise __________ times a day. Single leg lower with bent knees Lie on your back on a firm bed or the floor. Tense your abdominal muscles and lift your feet off the floor, one foot at a time, so your knees and hips are bent in 90-degree angles (right angles). Your knees should be over your hips and your lower legs should be parallel to the floor. Keeping your abdominal muscles tense and your knee bent, slowly lower one of your legs so your toe touches the ground. Lift your leg back up to return to the starting position. Do not hold your breath. Do not let your back arch. Keep your back flat against the ground. Repeat with your other leg. Repeat __________ times. Complete this exercise __________ times a day. Posture and body mechanics Good posture and healthy body mechanics can help to relieve stress in your body's tissues and joints. Body mechanics refers to the movements and positions of your body while you do your daily activities. Posture is part of body mechanics. Good posture means: Your spine is in its natural S-curve position (neutral). Your shoulders are pulled back slightly. Your head is  not tipped forward (neutral). Follow these guidelines to improve your posture and body mechanics in your everyday activities. Standing  When standing, keep your spine neutral and your feet about hip-width apart. Keep a slight bend in your knees. Your ears, shoulders, and hips should line up. When you do a task in which you stand in one place for a long time, place one foot up on a stable object that is 2-4 inches (5-10 cm) high, such as a footstool. This helps keep your spine neutral. Sitting  When sitting, keep your spine neutral and keep your feet flat on the floor. Use a footrest, if necessary, and keep your thighs parallel to the floor. Avoid rounding your shoulders, and avoid tilting your head forward. When working at a desk or a computer, keep your desk at a height where your hands are slightly lower than your elbows. Slide your chair under your desk so you are close enough to maintain good posture. When working at a computer, place your monitor at a height where you are looking straight ahead and you do not have to tilt your head forward or downward to look at the screen. Resting When lying down and resting, avoid positions that are most painful for you. If you have pain with activities such as sitting, bending, stooping, or squatting, lie in a position in which your body does not bend very much. For example, avoid curling up on your side with your arms and knees near your chest (fetal position). If you have pain with activities such as standing for a long time or reaching with your arms, lie with your spine in a neutral position and bend your knees slightly. Try the following positions: Lying on your side with a pillow between your  knees. Lying on your back with a pillow under your knees. Lifting  When lifting objects, keep your feet at least shoulder-width apart and tighten your abdominal muscles. Bend your knees and hips and keep your spine neutral. It is important to lift using the  strength of your legs, not your back. Do not lock your knees straight out. Always ask for help to lift heavy or awkward objects. This information is not intended to replace advice given to you by your health care provider. Make sure you discuss any questions you have with your health care provider. Document Revised: 12/30/2022 Document Reviewed: 11/13/2020 [employer] Patient Education  2024 [employer].

## 2023-08-06 ENCOUNTER — Ambulatory Visit: Payer: 59 | Admitting: Family Medicine

## 2023-08-06 ENCOUNTER — Encounter: Payer: Self-pay | Admitting: Family Medicine

## 2023-08-06 VITALS — BP 138/70 | HR 80 | Temp 98.7°F | Ht 63.0 in | Wt 131.0 lb

## 2023-08-06 DIAGNOSIS — F17200 Nicotine dependence, unspecified, uncomplicated: Secondary | ICD-10-CM | POA: Diagnosis not present

## 2023-08-06 DIAGNOSIS — R0981 Nasal congestion: Secondary | ICD-10-CM

## 2023-08-06 DIAGNOSIS — R300 Dysuria: Secondary | ICD-10-CM

## 2023-08-06 LAB — URINALYSIS, ROUTINE W REFLEX MICROSCOPIC
Bilirubin, UA: NEGATIVE
Glucose, UA: NEGATIVE
Leukocytes,UA: NEGATIVE
Nitrite, UA: NEGATIVE
RBC, UA: NEGATIVE
Specific Gravity, UA: >1.03 — ABNORMAL HIGH (ref 1.005–1.030)
Urobilinogen, Ur: 1 mg/dL (ref 0.2–1.0)
pH, UA: 5.5 (ref 5.0–7.5)

## 2023-08-06 LAB — MICROSCOPIC EXAMINATION
RBC, Urine: NONE SEEN /[HPF] (ref 0–2)
Renal Epithel, UA: NONE SEEN /[HPF]
Yeast, UA: NONE SEEN

## 2023-08-06 MED ORDER — CEFDINIR 300 MG PO CAPS: 300.0000 mg | ORAL_CAPSULE | Freq: Two times a day (BID) | ORAL | 0 refills | Status: DC

## 2023-08-06 MED ORDER — NICOTINE 21 MG/24HR TD PT24: 21.0000 mg | MEDICATED_PATCH | Freq: Every day | TRANSDERMAL | 0 refills | Status: DC

## 2023-08-06 NOTE — Progress Notes (Signed)
Subjective:  Patient ID: Janet Randolph, female    DOB: [birth year], 62 y.o.   MRN: [id number]  Patient Care Team: Bennie Pierini, FNP as PCP - [facility]   Chief Complaint:  Sinus Problem (Sore throat/Possible uti /Swollen gland left side/)  HPI: Janet Randolph is a 62 y.o. female presenting on 08/06/2023 for Sinus Problem (Sore throat/Possible uti /Swollen gland left side/)  States that she has sinus infection. Reports that symptoms started last week with headache, congestion, PND, and sore throat. She reports that she feels her left side "glands" are swollen. Points to in front of her ear, jaw, and neck. She is taking aleve for the headache. She attempted to take leftover doxycycline last week. States that she took 4 doses and her symptoms did not improve so she stopped them.   In addition, states that she has had urinary tract symptoms. She reports that she took a zpack 3 weeks ago, but her symptoms have not improved. She would like to know if she still has a UTI.   In addition, she would like to quit smoking and needs a refill on nicotine patches. States that she tried them previously but she was not successful and needs to restart.   Relevant past medical, surgical, family, and social history reviewed and updated as indicated.  Allergies and medications reviewed and updated. Data reviewed: Chart in Epic.   Past Medical History:  Diagnosis Date   Depression    Eczema    Hyperlipidemia    Hypertension    Spinal stenosis 2024   Vitamin D deficiency     Past Surgical History:  Procedure Laterality Date   HAND SURGERY Bilateral    MOLE REMOVAL  06/12/2022   TEE WITHOUT CARDIOVERSION N/A 01/02/2022   Procedure: TRANSESOPHAGEAL ECHOCARDIOGRAM (TEE);  Surgeon: Lewayne Bunting, MD;  Location: [facility] ENDOSCOPY;  Service: Cardiovascular;  Laterality: N/A;    Social History   Socioeconomic History   Marital status:  Divorced    Spouse name: Not on file   Number of children: Not on file   Years of education: Not on file   Highest education level: Bachelor's degree (e.g., BA, AB, BS)  Occupational History   Not on file  Tobacco Use   Smoking status: Every Day    Types: Cigarettes    Passive exposure: Never   Smokeless tobacco: Never  Vaping Use   Vaping status: Never Used  Substance and Sexual Activity   Alcohol use: Yes    Comment: wine occ   Drug use: No   Sexual activity: Not on file  Other Topics Concern   Not on file  Social History Narrative   Not on file   Social Determinants of Health   Financial Resource Strain: Low Risk  (08/05/2023)   Overall Financial Resource Strain (CARDIA)    Difficulty of Paying Living Expenses: Not hard at all  Food Insecurity: No Food Insecurity (08/05/2023)   Hunger Vital Sign    Worried About Running Out of Food in the Last Year: Never true    Ran Out of Food in the Last Year: Never true  Transportation Needs: No Transportation Needs (08/05/2023)   PRAPARE - Administrator, Civil Service (Medical): No    Lack of Transportation (Non-Medical): No  Physical Activity: Unknown (08/05/2023)   Exercise Vital Sign    Days of Exercise per Week: 0 days    Minutes of Exercise per Session: Not  on file  Stress: Stress Concern Present (08/05/2023)   [employer] of Occupational Health - Occupational Stress Questionnaire    Feeling of Stress : Rather much  Social Connections: Moderately Isolated (08/05/2023)   Social Connection and Isolation Panel [NHANES]    Frequency of Communication with Friends and Family: More than three times a week    Frequency of Social Gatherings with Friends and Family: Once a week    Attends Religious Services: More than 4 times per year    Active Member of [employer] or Organizations: No    Attends Banker Meetings: Not on file    Marital Status: Divorced  Intimate Partner Violence: Not on file     Outpatient Encounter Medications as of 08/06/2023  Medication Sig   ALPRAZolam (XANAX) 0.5 MG tablet Take 1 tablet (0.5 mg total) by mouth 2 (two) times daily as needed.   Aspirin-Acetaminophen-Caffeine (GOODY HEADACHE PO) Take 3 Packages by mouth daily as needed (headache).   CALCIUM PO Take by mouth daily.   Cyanocobalamin (VITAMIN B-12 PO) Take by mouth daily.   desonide (DESOWEN) 0.05 % cream Apply topically 2 (two) times daily. (Patient taking differently: Apply 1 application  topically 2 (two) times daily as needed (rash).)   esomeprazole (NEXIUM) 40 MG capsule Take 1 capsule (40 mg total) by mouth daily. Take 30 min before meal   lisinopril (ZESTRIL) 20 MG tablet Take 1 tablet (20 mg total) by mouth daily.   loperamide (IMODIUM) 2 MG capsule Take 4 mg by mouth daily as needed for diarrhea or loose stools.   nicotine (NICODERM CQ - DOSED IN MG/24 HOURS) 21 mg/24hr patch SMARTSIG:Patch(s) Topical Daily   rosuvastatin (CRESTOR) 10 MG tablet Take 1 tablet (10 mg total) by mouth daily.   venlafaxine XR (EFFEXOR-XR) 75 MG 24 hr capsule Take 2 capsules (150 mg total) by mouth daily with breakfast.   VITAMIN D PO Take 1,000 Units by mouth daily.   Zoledronic Acid (RECLAST IV) Inject into the vein. Infusion on 01/03/2023   [DISCONTINUED] azithromycin (ZITHROMAX) 250 MG tablet Take 250 mg by mouth 2 (two) times daily.   [DISCONTINUED] budesonide (ENTOCORT EC) 3 MG 24 hr capsule Take 3 mg by mouth daily. (Patient not taking: Reported on 06/24/2023)   [DISCONTINUED] clobetasol ointment (TEMOVATE) 0.05 % Apply 1 application topically daily. Large surface area (Patient taking differently: Apply 1 application  topically daily as needed (dry skin). Large surface area)   [DISCONTINUED] Insulin Pen Needle 31G X 5 MM MISC Use to inject Forteo once daily. DO NOT REUSE NEEDLES. (Patient not taking: Reported on 06/24/2023)   No facility-administered encounter medications on file as of 08/06/2023.     Allergies  Allergen Reactions   Latex Rash   Penicillins Rash    Review of Systems As per HPI  Objective:  BP 138/70   Pulse 80   Temp 98.7 F (37.1 C)   Ht 5\' 3"  (1.6 m)   Wt 131 lb (59.4 kg)   SpO2 92%   BMI 23.21 kg/m    Wt Readings from Last 3 Encounters:  08/06/23 131 lb (59.4 kg)  06/24/23 134 lb 6.4 oz (61 kg)  03/31/23 136 lb (61.7 kg)   Physical Exam Constitutional:      General: She is awake. She is not in acute distress.    Appearance: Normal appearance. She is well-developed and well-groomed. She is not ill-appearing, toxic-appearing or diaphoretic.  HENT:     Right Ear: Tympanic membrane, ear canal  and external ear normal.     Left Ear: Tympanic membrane, ear canal and external ear normal.     Nose: Congestion present. No rhinorrhea.     Right Sinus: Maxillary sinus tenderness present. No frontal sinus tenderness.     Left Sinus: Maxillary sinus tenderness present. No frontal sinus tenderness.     Mouth/Throat:     Lips: Pink. No lesions.     Mouth: Mucous membranes are moist.     Pharynx: No pharyngeal swelling, oropharyngeal exudate, posterior oropharyngeal erythema, uvula swelling or postnasal drip.     Tonsils: No tonsillar exudate or tonsillar abscesses.  Cardiovascular:     Rate and Rhythm: Normal rate and regular rhythm.     Pulses: Normal pulses.          Radial pulses are 2+ on the right side and 2+ on the left side.       Posterior tibial pulses are 2+ on the right side and 2+ on the left side.     Heart sounds: Normal heart sounds. No murmur heard.    No gallop.  Pulmonary:     Effort: Pulmonary effort is normal. No respiratory distress.     Breath sounds: Decreased air movement present. No stridor. Examination of the right-upper field reveals decreased breath sounds. Examination of the left-upper field reveals decreased breath sounds. Examination of the right-middle field reveals decreased breath sounds. Examination of the left-middle  field reveals decreased breath sounds. Examination of the right-lower field reveals decreased breath sounds. Examination of the left-lower field reveals decreased breath sounds. Decreased breath sounds present. No wheezing, rhonchi or rales.  Musculoskeletal:     Cervical back: Full passive range of motion without pain and neck supple.     Right lower leg: No edema.     Left lower leg: No edema.  Lymphadenopathy:     Head:     Right side of head: No submental, submandibular, tonsillar, preauricular or posterior auricular adenopathy.     Left side of head: Tonsillar adenopathy present. No submental, submandibular, preauricular or posterior auricular adenopathy.     Cervical: Cervical adenopathy present.     Right cervical: Superficial cervical adenopathy present. No deep cervical adenopathy.    Left cervical: Superficial cervical adenopathy present. No deep cervical adenopathy.  Skin:    General: Skin is warm.     Capillary Refill: Capillary refill takes less than 2 seconds.  Neurological:     General: No focal deficit present.     Mental Status: She is alert, oriented to person, place, and time and easily aroused. Mental status is at baseline.     GCS: GCS eye subscore is 4. GCS verbal subscore is 5. GCS motor subscore is 6.     Motor: No weakness.  Psychiatric:        Attention and Perception: Attention and perception normal.        Mood and Affect: Mood and affect normal.        Speech: Speech normal.        Behavior: Behavior normal. Behavior is cooperative.        Thought Content: Thought content normal. Thought content does not include homicidal or suicidal ideation. Thought content does not include homicidal or suicidal plan.        Cognition and Memory: Cognition and memory normal.        Judgment: Judgment normal.    Results for orders placed or performed in visit on 03/31/23  CBC with Differential/Platelet  Result Value Ref Range   WBC 5.8 3.4 - 10.8 x10E3/uL   RBC 3.66 (L)  3.77 - 5.28 x10E6/uL   Hemoglobin 11.8 11.1 - 15.9 g/dL   Hematocrit 78.4 69.6 - 46.6 %   MCV 98 (H) 79 - 97 fL   MCH 32.2 26.6 - 33.0 pg   MCHC 32.8 31.5 - 35.7 g/dL   RDW 29.5 28.4 - 13.2 %   Platelets 362 150 - 450 x10E3/uL   Neutrophils 67 Not Estab. %   Lymphs 24 Not Estab. %   Monocytes 7 Not Estab. %   Eos 1 Not Estab. %   Basos 1 Not Estab. %   Neutrophils Absolute 4.0 1.4 - 7.0 x10E3/uL   Lymphocytes Absolute 1.4 0.7 - 3.1 x10E3/uL   Monocytes Absolute 0.4 0.1 - 0.9 x10E3/uL   EOS (ABSOLUTE) 0.1 0.0 - 0.4 x10E3/uL   Basophils Absolute 0.0 0.0 - 0.2 x10E3/uL   Immature Granulocytes 0 Not Estab. %   Immature Grans (Abs) 0.0 0.0 - 0.1 x10E3/uL  CMP14+EGFR  Result Value Ref Range   Glucose 80 70 - 99 mg/dL   BUN 19 8 - 27 mg/dL   Creatinine, Ser 4.40 0.57 - 1.00 mg/dL   eGFR 102 >72 ZD/GUY/4.03   BUN/Creatinine Ratio 32 (H) 12 - 28   Sodium 141 134 - 144 mmol/L   Potassium 4.3 3.5 - 5.2 mmol/L   Chloride 105 96 - 106 mmol/L   CO2 20 20 - 29 mmol/L   Calcium 8.9 8.7 - 10.3 mg/dL   Total Protein 6.2 6.0 - 8.5 g/dL   Albumin 4.2 3.9 - 4.9 g/dL   Globulin, Total 2.0 1.5 - 4.5 g/dL   Bilirubin Total <4.7 0.0 - 1.2 mg/dL   Alkaline Phosphatase 47 44 - 121 IU/L   AST 18 0 - 40 IU/L   ALT 8 0 - 32 IU/L  Lipid panel  Result Value Ref Range   Cholesterol, Total 160 100 - 199 mg/dL   Triglycerides 94 0 - 149 mg/dL   HDL 51 >42 mg/dL   VLDL Cholesterol Cal 17 5 - 40 mg/dL   LDL Chol Calc (NIH) 92 0 - 99 mg/dL   Chol/HDL Ratio 3.1 0.0 - 4.4 ratio       08/06/2023   12:09 PM 03/31/2023    2:37 PM 09/30/2022    2:11 PM 03/29/2022    2:59 PM 09/28/2021    2:41 PM  Depression screen PHQ 2/9  Decreased Interest 1 1 3 2 2   Down, Depressed, Hopeless 1 0 2 1 2   PHQ - 2 Score 2 1 5 3 4   Altered sleeping 0 0 2 1 0  Tired, decreased energy 1 2 3 3 3   Change in appetite 3 2 2 3 2   Feeling bad or failure about yourself  2 1 1 1 2   Trouble concentrating 0 1 3 1 1   Moving slowly  or fidgety/restless 0 0 0 0 0  Suicidal thoughts 0 0 0 0 0  PHQ-9 Score 8 7 16 12 12   Difficult doing work/chores Not difficult at all Not difficult at all Somewhat difficult Not difficult at all Not difficult at all       08/06/2023   12:09 PM 03/31/2023    2:37 PM 09/30/2022    2:12 PM 03/29/2022    3:00 PM  GAD 7 : Generalized Anxiety Score  Nervous, Anxious, on Edge 3 1 3 3   Control/stop worrying 1 0 2 1  Worry too much - different things 0 0 1 1  Trouble relaxing 1 0 2 1  Restless 1 0 0 1  Easily annoyed or irritable 1 2 2 1   Afraid - awful might happen 1 0 1 1  Total GAD 7 Score 8 3 11 9   Anxiety Difficulty Not difficult at all Not difficult at all Somewhat difficult Not difficult at all    Pertinent labs & imaging results that were available during my care of the patient were reviewed by me and considered in my medical decision making.  Assessment & Plan:  Janalee was seen today for sinus problem.  Diagnoses and all orders for this visit:  Congestion of nasal sinus Will start medication below. Educated patient at length on completing abx course and not treating at home with leftover antibiotics. Discussed with patient supportive treatments to continue.  -     cefdinir (OMNICEF) 300 MG capsule; Take 1 capsule (300 mg total) by mouth 2 (two) times daily. 1 po BID  Dysuria Labs as below. Will wait for results of culture to treat. Encouraged patient to increase hydration. Will repeat in 2 weeks due to bilirubin positive.  -     Urinalysis, Routine w reflex microscopic -     Urine Culture  Tobacco use disorder Refill provided. Patient to follow up with PCP.  -     nicotine (NICODERM CQ - DOSED IN MG/24 HOURS) 21 mg/24hr patch; Place 1 patch (21 mg total) onto the skin daily.  Continue all other maintenance medications.  Follow up plan: Return if symptoms worsen or fail to improve.   Continue healthy lifestyle choices, including diet (rich in fruits, vegetables, and lean  proteins, and low in salt and simple carbohydrates) and exercise (at least 30 minutes of moderate physical activity daily).  Written and verbal instructions provided   The above assessment and management plan was discussed with the patient. The patient verbalized understanding of and has agreed to the management plan. Patient is aware to call the clinic if they develop any new symptoms or if symptoms persist or worsen. Patient is aware when to return to the clinic for a follow-up visit. Patient educated on when it is appropriate to go to the emergency department.   Neale Burly, DNP-FNP [facility] [address] [place], [place] [address] [phone]

## 2023-08-08 LAB — URINE CULTURE

## 2023-08-12 NOTE — Progress Notes (Signed)
Negative for UTI on culture. Follow up if symptoms continue.

## 2023-10-01 NOTE — Patient Instructions (Signed)
Our records indicate that you are due for your screening mammogram.  Please call the imaging center that does your yearly mammograms to make an appointment for a mammogram at your earliest convenience. Our office also has a mobile unit through the [facility] that comes to our location. Please call our office if you would like to make an appointment.   

## 2023-10-02 ENCOUNTER — Ambulatory Visit: Payer: 59 | Admitting: Nurse Practitioner

## 2023-10-02 ENCOUNTER — Encounter: Payer: Self-pay | Admitting: Nurse Practitioner

## 2023-10-02 VITALS — BP 132/78 | HR 76 | Temp 98.0°F | Ht 63.0 in | Wt 136.0 lb

## 2023-10-02 DIAGNOSIS — I10 Essential (primary) hypertension: Secondary | ICD-10-CM

## 2023-10-02 DIAGNOSIS — F3342 Major depressive disorder, recurrent, in full remission: Secondary | ICD-10-CM | POA: Diagnosis not present

## 2023-10-02 DIAGNOSIS — N3000 Acute cystitis without hematuria: Secondary | ICD-10-CM

## 2023-10-02 DIAGNOSIS — R8290 Unspecified abnormal findings in urine: Secondary | ICD-10-CM

## 2023-10-02 DIAGNOSIS — K219 Gastro-esophageal reflux disease without esophagitis: Secondary | ICD-10-CM | POA: Diagnosis not present

## 2023-10-02 DIAGNOSIS — E782 Mixed hyperlipidemia: Secondary | ICD-10-CM

## 2023-10-02 DIAGNOSIS — F411 Generalized anxiety disorder: Secondary | ICD-10-CM

## 2023-10-02 DIAGNOSIS — F17200 Nicotine dependence, unspecified, uncomplicated: Secondary | ICD-10-CM

## 2023-10-02 LAB — URINALYSIS, COMPLETE
Bilirubin, UA: NEGATIVE
Glucose, UA: NEGATIVE
Nitrite, UA: NEGATIVE
Specific Gravity, UA: 1.025 (ref 1.005–1.030)
Urobilinogen, Ur: 0.2 mg/dL (ref 0.2–1.0)
pH, UA: 5.5 (ref 5.0–7.5)

## 2023-10-02 LAB — MICROSCOPIC EXAMINATION
Renal Epithel, UA: NONE SEEN /[HPF]
WBC, UA: >30 /[HPF] — AB (ref 0–5)

## 2023-10-02 MED ORDER — ALPRAZOLAM 0.5 MG PO TABS: 0.5000 mg | ORAL_TABLET | Freq: Two times a day (BID) | ORAL | 5 refills | Status: DC | PRN

## 2023-10-02 MED ORDER — ESOMEPRAZOLE MAGNESIUM 40 MG PO CPDR: 40.0000 mg | DELAYED_RELEASE_CAPSULE | Freq: Every day | ORAL | 1 refills | Status: DC

## 2023-10-02 MED ORDER — NICOTINE 21 MG/24HR TD PT24: 21.0000 mg | MEDICATED_PATCH | Freq: Every day | TRANSDERMAL | 0 refills | Status: DC

## 2023-10-02 MED ORDER — SULFAMETHOXAZOLE-TRIMETHOPRIM 800-160 MG PO TABS: 1.0000 | ORAL_TABLET | Freq: Two times a day (BID) | ORAL | 0 refills | Status: DC

## 2023-10-02 MED ORDER — VENLAFAXINE HCL ER 75 MG PO CP24: 150.0000 mg | ORAL_CAPSULE | Freq: Every day | ORAL | 1 refills | Status: DC

## 2023-10-02 MED ORDER — ROSUVASTATIN CALCIUM 10 MG PO TABS: 10.0000 mg | ORAL_TABLET | Freq: Every day | ORAL | 1 refills | Status: DC

## 2023-10-02 MED ORDER — LISINOPRIL 20 MG PO TABS: 20.0000 mg | ORAL_TABLET | Freq: Every day | ORAL | 1 refills | Status: DC

## 2023-10-02 NOTE — Progress Notes (Signed)
Subjective:    Patient ID: Janet Randolph, female    DOB: 1960-12-15, 63 y.o.   MRN: [id number]   Chief Complaint: medical management of chronic issues     HPI:  Janet Randolph is a 63 y.o. who identifies as a female who was assigned female at birth.   Social history: Lives with: husband Work history: works for [employer] in today for follow up of the following chronic medical issues:  1. Primary hypertension No c/o chest pain, sob or headache. Doe snot check blood pressure at home. BP Readings from Last 3 Encounters:  08/06/23 138/70  06/24/23 (!) 152/87  03/31/23 115/72     2. Mixed hyperlipidemia Does not watch diet very closely and does no dedicated exercise. Lab Results  Component Value Date   CHOL 160 03/31/2023   HDL 51 03/31/2023   LDLCALC 92 03/31/2023   TRIG 94 03/31/2023   CHOLHDL 3.1 03/31/2023   The 10-year ASCVD risk score (Arnett DK, et al., 2019) is: 6.5%   3. Gastroesophageal reflux disease, unspecified whether esophagitis present Is on nexium daily and works well for her  4. Recurrent major depressive disorder, in full remission (HCC) Has been on Effexor for awhile and it works well for her.    10/02/2023   11:46 AM 08/06/2023   12:09 PM 03/31/2023    2:37 PM  Depression screen PHQ 2/9  Decreased Interest 1 1 1   Down, Depressed, Hopeless 1 1 0  PHQ - 2 Score 2 2 1   Altered sleeping 0 0 0  Tired, decreased energy 2 1 2   Change in appetite 1 3 2   Feeling bad or failure about yourself  1 2 1   Trouble concentrating 0 0 1  Moving slowly or fidgety/restless 0 0 0  Suicidal thoughts 0 0 0  PHQ-9 Score 6 8 7   Difficult doing work/chores Not difficult at all Not difficult at all Not difficult at all      5. GAD (generalized anxiety disorder) Is on xanax bid. Gets anxious without taking. Has been on this for years     10/02/2023   11:47 AM 08/06/2023   12:09 PM 03/31/2023    2:37 PM 09/30/2022    2:12 PM  GAD 7 :  Generalized Anxiety Score  Nervous, Anxious, on Edge 3 3 1 3   Control/stop worrying 1 1 0 2  Worry too much - different things 0 0 0 1  Trouble relaxing 2 1 0 2  Restless 1 1 0 0  Easily annoyed or irritable 2 1 2 2   Afraid - awful might happen 0 1 0 1  Total GAD 7 Score 9 8 3 11   Anxiety Difficulty Not difficult at all Not difficult at all Not difficult at all Somewhat difficult         New complaints: Dysuria- started several days ago- occurs with every urination. Denies freq and urgency.  Allergies  Allergen Reactions   Latex Rash   Penicillins Rash   Outpatient Encounter Medications as of 10/02/2023  Medication Sig   ALPRAZolam (XANAX) 0.5 MG tablet Take 1 tablet (0.5 mg total) by mouth 2 (two) times daily as needed.   Aspirin-Acetaminophen-Caffeine (GOODY HEADACHE PO) Take 3 Packages by mouth daily as needed (headache).   CALCIUM PO Take by mouth daily.   cefdinir (OMNICEF) 300 MG capsule Take 1 capsule (300 mg total) by mouth 2 (two) times daily. 1 po BID   Cyanocobalamin (VITAMIN B-12 PO) Take  by mouth daily.   desonide (DESOWEN) 0.05 % cream Apply topically 2 (two) times daily. (Patient taking differently: Apply 1 application  topically 2 (two) times daily as needed (rash).)   esomeprazole (NEXIUM) 40 MG capsule Take 1 capsule (40 mg total) by mouth daily. Take 30 min before meal   lisinopril (ZESTRIL) 20 MG tablet Take 1 tablet (20 mg total) by mouth daily.   loperamide (IMODIUM) 2 MG capsule Take 4 mg by mouth daily as needed for diarrhea or loose stools.   nicotine (NICODERM CQ - DOSED IN MG/24 HOURS) 21 mg/24hr patch Place 1 patch (21 mg total) onto the skin daily.   rosuvastatin (CRESTOR) 10 MG tablet Take 1 tablet (10 mg total) by mouth daily.   venlafaxine XR (EFFEXOR-XR) 75 MG 24 hr capsule Take 2 capsules (150 mg total) by mouth daily with breakfast.   VITAMIN D PO Take 1,000 Units by mouth daily.   Zoledronic Acid (RECLAST IV) Inject into the vein. Infusion on  01/03/2023   No facility-administered encounter medications on file as of 10/02/2023.    Past Surgical History:  Procedure Laterality Date   HAND SURGERY Bilateral    MOLE REMOVAL  06/12/2022   TEE WITHOUT CARDIOVERSION N/A 01/02/2022   Procedure: TRANSESOPHAGEAL ECHOCARDIOGRAM (TEE);  Surgeon: Lewayne Bunting, MD;  Location: [facility] ENDOSCOPY;  Service: Cardiovascular;  Laterality: N/A;    Family History  Problem Relation Age of Onset   Melanoma Mother    Asthma Father    Crohn's disease Father    Melanoma Father    Asthma Brother    Heart Problems Brother    Stroke Brother    Healthy Son    Healthy Daughter       Controlled substance contract: n/a     Review of Systems  Constitutional:  Negative for diaphoresis.  Eyes:  Negative for pain.  Respiratory:  Negative for shortness of breath.   Cardiovascular:  Negative for chest pain, palpitations and leg swelling.  Gastrointestinal:  Negative for abdominal pain.  Endocrine: Negative for polydipsia.  Skin:  Negative for rash.  Neurological:  Negative for dizziness, weakness and headaches.  Hematological:  Does not bruise/bleed easily.  All other systems reviewed and are negative.      Objective:   Physical Exam Vitals and nursing note reviewed.  Constitutional:      General: She is not in acute distress.    Appearance: Normal appearance. She is well-developed.  HENT:     Head: Normocephalic.     Right Ear: Tympanic membrane normal.     Left Ear: Tympanic membrane normal.     Nose: Nose normal.     Mouth/Throat:     Mouth: Mucous membranes are moist.  Eyes:     Pupils: Pupils are equal, round, and reactive to light.  Neck:     Vascular: No carotid bruit or JVD.  Cardiovascular:     Rate and Rhythm: Normal rate and regular rhythm.     Heart sounds: Normal heart sounds.  Pulmonary:     Effort: Pulmonary effort is normal. No respiratory distress.     Breath sounds: Normal breath sounds. No wheezing or rales.   Chest:     Chest wall: No tenderness.  Abdominal:     General: Bowel sounds are normal. There is no distension or abdominal bruit.     Palpations: Abdomen is soft. There is no hepatomegaly, splenomegaly, mass or pulsatile mass.     Tenderness: There is no abdominal  tenderness.  Musculoskeletal:        General: Normal range of motion.     Cervical back: Normal range of motion and neck supple.  Lymphadenopathy:     Cervical: No cervical adenopathy.  Skin:    General: Skin is warm and dry.  Neurological:     Mental Status: She is alert and oriented to person, place, and time.     Deep Tendon Reflexes: Reflexes are normal and symmetric.  Psychiatric:        Behavior: Behavior normal.        Thought Content: Thought content normal.        Judgment: Judgment normal.    BP 132/78   Pulse 76   Temp 98 F (36.7 C) (Temporal)   Ht 5\' 3"  (1.6 m)   Wt 136 lb (61.7 kg)   SpO2 96%   BMI 24.09 kg/m          Assessment & Plan:   Stephania Fragmin comes in today with chief complaint of No chief complaint on file.   Diagnosis and orders addressed:  1. Primary hypertension Low sodium diet - lisinopril (ZESTRIL) 20 MG tablet; Take 1 tablet (20 mg total) by mouth daily.  Dispense: 90 tablet; Refill: 1  2. Mixed hyperlipidemia Low fat diet - rosuvastatin (CRESTOR) 10 MG tablet; Take 1 tablet (10 mg total) by mouth daily.  Dispense: 90 tablet; Refill: 1  3. Gastroesophageal reflux disease, unspecified whether esophagitis present Avoid spicy foods Do not eat 2 hours prior to bedtime - esomeprazole (NEXIUM) 40 MG capsule; Take 1 capsule (40 mg total) by mouth daily. Take 30 min before meal  Dispense: 90 capsule; Refill: 1  4. Recurrent major depressive disorder, in full remission (HCC) Stress management - venlafaxine XR (EFFEXOR-XR) 75 MG 24 hr capsule; Take 2 capsules (150 mg total) by mouth daily with breakfast.  Dispense: 180 capsule; Refill: 1  5. GAD (generalized anxiety  disorder)  - ALPRAZolam (XANAX) 0.5 MG tablet; Take 1 tablet (0.5 mg total) by mouth 2 (two) times daily as needed.  Dispense: 60 tablet; Refill: 5  6. acute cystitis Take medication as prescribe Cotton underwear Take shower not bath Cranberry juice, yogurt Force fluids AZO over the counter X2 days Culture pending RTO prn Bactrim  1 po BID for 10 days #20 no refills    Labs pending Health Maintenance reviewed Diet and exercise encouraged  Follow up plan: 6 months   Mary-Margaret Daphine Deutscher, FNP

## 2023-10-05 LAB — URINE CULTURE

## 2023-10-06 MED ORDER — NITROFURANTOIN MONOHYD MACRO 100 MG PO CAPS: 100.0000 mg | ORAL_CAPSULE | Freq: Two times a day (BID) | ORAL | 0 refills | Status: DC

## 2023-10-06 NOTE — Addendum Note (Signed)
Addended by: Bennie Pierini on: 10/06/2023 09:05 AM   Modules accepted: Orders

## 2023-10-10 ENCOUNTER — Encounter: Payer: Self-pay | Admitting: Obstetrics and Gynecology

## 2023-10-10 ENCOUNTER — Other Ambulatory Visit: Payer: Self-pay | Admitting: Obstetrics and Gynecology

## 2023-10-10 DIAGNOSIS — Z13820 Encounter for screening for osteoporosis: Secondary | ICD-10-CM

## 2023-10-13 LAB — CMP14+EGFR
ALT: 11 [IU]/L (ref 0–32)
AST: 17 [IU]/L (ref 0–40)
Albumin: 4.1 g/dL (ref 3.9–4.9)
Alkaline Phosphatase: 60 [IU]/L (ref 44–121)
BUN/Creatinine Ratio: 31 — ABNORMAL HIGH (ref 12–28)
BUN: 21 mg/dL (ref 8–27)
Bilirubin Total: <0.2 mg/dL (ref 0.0–1.2)
CO2: 23 mmol/L (ref 20–29)
Calcium: 9.1 mg/dL (ref 8.7–10.3)
Chloride: 104 mmol/L (ref 96–106)
Creatinine, Ser: 0.67 mg/dL (ref 0.57–1.00)
Globulin, Total: 2.3 g/dL (ref 1.5–4.5)
Glucose: 80 mg/dL (ref 70–99)
Potassium: 4.4 mmol/L (ref 3.5–5.2)
Sodium: 143 mmol/L (ref 134–144)
Total Protein: 6.4 g/dL (ref 6.0–8.5)
eGFR: 98 mL/min/{1.73_m2} (ref >59)

## 2023-10-13 LAB — BENZODIAZEPINES,MS,WB/SP RFX
7-Aminoclonazepam: NEGATIVE ng/mL
Alprazolam: 23.7 ng/mL
Benzodiazepines Confirm: POSITIVE
Chlordiazepoxide: NEGATIVE
Clonazepam: NEGATIVE ng/mL
Desalkylflurazepam: NEGATIVE ng/mL
Desmethylchlordiazepoxide: NEGATIVE
Desmethyldiazepam: NEGATIVE ng/mL
Diazepam: NEGATIVE ng/mL
Flurazepam: NEGATIVE ng/mL
Lorazepam: NEGATIVE ng/mL
Midazolam: NEGATIVE ng/mL
Oxazepam: NEGATIVE ng/mL
Temazepam: NEGATIVE ng/mL
Triazolam: NEGATIVE ng/mL

## 2023-10-13 LAB — CBC WITH DIFFERENTIAL/PLATELET
Basophils Absolute: 0 10*3/uL (ref 0.0–0.2)
Basos: 0 %
EOS (ABSOLUTE): 0.1 10*3/uL (ref 0.0–0.4)
Eos: 1 %
Hematocrit: 35.5 % (ref 34.0–46.6)
Hemoglobin: 11.9 g/dL (ref 11.1–15.9)
Immature Grans (Abs): 0 10*3/uL (ref 0.0–0.1)
Immature Granulocytes: 0 %
Lymphocytes Absolute: 1.4 10*3/uL (ref 0.7–3.1)
Lymphs: 22 %
MCH: 31.8 pg (ref 26.6–33.0)
MCHC: 33.5 g/dL (ref 31.5–35.7)
MCV: 95 fL (ref 79–97)
Monocytes Absolute: 0.4 10*3/uL (ref 0.1–0.9)
Monocytes: 7 %
Neutrophils Absolute: 4.3 10*3/uL (ref 1.4–7.0)
Neutrophils: 70 %
Platelets: 375 10*3/uL (ref 150–450)
RBC: 3.74 x10E6/uL — ABNORMAL LOW (ref 3.77–5.28)
RDW: 11.9 % (ref 11.7–15.4)
WBC: 6.3 10*3/uL (ref 3.4–10.8)

## 2023-10-13 LAB — DRUG SCREEN 10 W/CONF, SERUM
Amphetamines, IA: NEGATIVE ng/mL
Barbiturates, IA: NEGATIVE ug/mL
Benzodiazepines, IA: POSITIVE ng/mL — AB
Cocaine & Metabolite, IA: NEGATIVE ng/mL
Methadone, IA: NEGATIVE ng/mL
Opiates, IA: NEGATIVE ng/mL
Oxycodones, IA: NEGATIVE ng/mL
Phencyclidine, IA: NEGATIVE ng/mL
Propoxyphene, IA: NEGATIVE ng/mL
THC(Marijuana) Metabolite, IA: NEGATIVE ng/mL

## 2023-10-13 LAB — LIPID PANEL
Chol/HDL Ratio: 2.5 {ratio} (ref 0.0–4.4)
Cholesterol, Total: 155 mg/dL (ref 100–199)
HDL: 62 mg/dL (ref >39)
LDL Chol Calc (NIH): 80 mg/dL (ref 0–99)
Triglycerides: 63 mg/dL (ref 0–149)
VLDL Cholesterol Cal: 13 mg/dL (ref 5–40)

## 2023-10-22 ENCOUNTER — Ambulatory Visit
Admission: RE | Admit: 2023-10-22 | Discharge: 2023-10-22 | Disposition: A | Discharge status: Home / Self Care | Payer: 59 | Source: Ambulatory Visit | Attending: Obstetrics and Gynecology

## 2023-10-22 DIAGNOSIS — Z13820 Encounter for screening for osteoporosis: Secondary | ICD-10-CM

## 2023-10-22 NOTE — Progress Notes (Signed)
DEXA scan shows no improvement in the bone mass density.  We will discuss treatment options at the follow-up visit on April 15.  If there are any earlier appointments we can see her sooner to discuss possible use of Evenity.

## 2023-10-26 NOTE — Progress Notes (Unsigned)
Office Visit Note  Patient: Stephania Fragmin             Date of Birth: July 04, 1961           MRN: [id number]             PCP: Bennie Pierini, FNP Referring: Bennie Pierini, * Visit Date: 10/27/2023 Occupation: @GUAROCC @  Subjective:  No chief complaint on file.   History of Present Illness: GLENYCE RANDLE is a 63 y.o. female ***     Activities of Daily Living:  Patient reports morning stiffness for *** {minute/hour:19697}.   Patient {ACTIONS;DENIES/REPORTS:21021675::"Denies"} nocturnal pain.  Difficulty dressing/grooming: {ACTIONS;DENIES/REPORTS:21021675::"Denies"} Difficulty climbing stairs: {ACTIONS;DENIES/REPORTS:21021675::"Denies"} Difficulty getting out of chair: {ACTIONS;DENIES/REPORTS:21021675::"Denies"} Difficulty using hands for taps, buttons, cutlery, and/or writing: {ACTIONS;DENIES/REPORTS:21021675::"Denies"}  No Rheumatology ROS completed.   PMFS History:  Patient Active Problem List   Diagnosis Date Noted   Age-related osteoporosis without fracture 12/20/2022   History of wrist fracture 12/20/2022   GERD (gastroesophageal reflux disease) 04/03/2016   Eczema of both hands 07/12/2015   GAD (generalized anxiety disorder) 06/04/2013   Hyperlipidemia 03/05/2013   Hypertension 03/05/2013   Depression 02/09/2013    Past Medical History:  Diagnosis Date   Depression    Eczema    Hyperlipidemia    Hypertension    Spinal stenosis 2024   Vitamin D deficiency     Family History  Problem Relation Age of Onset   Melanoma Mother    Asthma Father    Crohn's disease Father    Melanoma Father    Asthma Brother    Heart Problems Brother    Stroke Brother    Healthy Son    Healthy Daughter    Past Surgical History:  Procedure Laterality Date   HAND SURGERY Bilateral    MOLE REMOVAL  06/12/2022   TEE WITHOUT CARDIOVERSION N/A 01/02/2022   Procedure: TRANSESOPHAGEAL ECHOCARDIOGRAM (TEE);  Surgeon: Lewayne Bunting, MD;  Location: [facility]  ENDOSCOPY;  Service: Cardiovascular;  Laterality: N/A;   Social History   Social History Narrative   Not on file   Immunization History  Administered Date(s) Administered   PFIZER(Purple Top)SARS-COV-2 Vaccination 11/25/2019, 12/20/2019, 08/29/2020   Tdap 07/23/2010, 07/20/2021   Zoster Recombinant(Shingrix) 09/28/2021, 09/30/2022     Objective: Vital Signs: There were no vitals taken for this visit.   Physical Exam   Musculoskeletal Exam: ***  CDAI Exam: CDAI Score: -- Patient Global: --; Provider Global: -- Swollen: --; Tender: -- Joint Exam 10/27/2023   No joint exam has been documented for this visit   There is currently no information documented on the homunculus. Go to the Rheumatology activity and complete the homunculus joint exam.  Investigation: No additional findings.  Imaging: DG BONE DENSITY (DXA) Result Date: 10/22/2023 EXAM: DUAL X-RAY ABSORPTIOMETRY (DXA) FOR BONE MINERAL DENSITY IMPRESSION: Referring Physician:  Valerie Roys [facility] Your patient completed a bone mineral density test using GE Lunar iDXA system (analysis version: 16). Technologist: KAT PATIENT: Name: Hunter, Pinkard Patient ID: [id number] Birth Date: Feb 03, 1961 Height: 63.2 in. Sex: Female Measured: 10/22/2023 Weight: 139.8 lbs. Indications: Caucasian, Depression, Estrogen Deficient, History of Fracture (Adult) (V15.51), Nexium, Postmenopausal, Venlafaxine Fractures: Finger, Left Wrist, Rib, Right Wrist Treatments: Calcium (E943.0), Reclast, Vitamin D (E933.5) ASSESSMENT: The BMD measured at Femur Neck Right is 0.577 g/cm2 with a T-score of -3.3. This patient is considered osteoporotic according to World Health Organization [facility]) criteria. The quality of the exam is good. Site Region Measured Date Measured Age YA BMD Significant CHANGE  T-score DualFemur Neck Right 10/22/2023    64.4         -3.3    0.577 g/cm2 AP Spine  L1-L4      10/22/2023    64.4         -2.2    0.917 g/cm2 DualFemur Total Mean  10/22/2023    64.4         -2.9    0.647 g/cm2 World Health Organization [facility]) criteria for post-menopausal, Caucasian Women: Normal       T-score at or above -1 SD Osteopenia   T-score between -1 and -2.5 SD Osteoporosis T-score at or below -2.5 SD RECOMMENDATION: 1. All patients should optimize calcium and vitamin D intake. 2. Consider FDA-approved medical therapies in postmenopausal women and men aged 75 years and older, based on the following: a. A hip or vertebral (clinical or morphometric) fracture. b. T-score = -2.5 at the femoral neck or spine after appropriate evaluation to exclude secondary causes. c. Low bone mass (T-score between -1.0 and -2.5 at the femoral neck or spine) and a 10-year probability of a hip fracture = 3% or a 10-year probability of a major osteoporosis-related fracture = 20% based on the [place]-adapted WHO algorithm. d. Clinician judgment and/or patient preferences may indicate treatment for people with 10-year fracture probabilities above or below these levels. FOLLOW-UP: Patients with diagnosis of osteoporosis or at high risk for fracture should have regular bone mineral density tests.? Patients eligible for Medicare are allowed routine testing every 2 years.? The testing frequency can be increased to one year for patients who have rapidly progressing disease, are receiving or discontinuing medical therapy to restore bone mass, or have additional risk factors. I have reviewed this study and agree with the findings. [facility] Electronically Signed   By: Baird Lyons M.D.   On: 10/22/2023 13:36    Recent Labs: Lab Results  Component Value Date   WBC 6.3 10/02/2023   HGB 11.9 10/02/2023   PLT 375 10/02/2023   NA 143 10/02/2023   K 4.4 10/02/2023   CL 104 10/02/2023   CO2 23 10/02/2023   GLUCOSE 80 10/02/2023   BUN 21 10/02/2023   CREATININE 0.67 10/02/2023   BILITOT <0.2 10/02/2023   ALKPHOS 60 10/02/2023   AST 17 10/02/2023   ALT 11 10/02/2023   PROT 6.4  10/02/2023   ALBUMIN 4.1 10/02/2023   CALCIUM 9.1 10/02/2023   GFRAA 112 02/22/2020   October 22, 2023 DEXA:The BMD measured at Femur Neck Right is 0.577 g/cm2 with a T-score of -3.3.  Speciality Comments: Forteo 05/23-02/24-took 50% of the time  Procedures:  No procedures performed Allergies: Latex and Penicillins   Assessment / Plan:     Visit Diagnoses: No diagnosis found.  Orders: No orders of the defined types were placed in this encounter.  No orders of the defined types were placed in this encounter.   Face-to-face time spent with patient was *** minutes. Greater than 50% of time was spent in counseling and coordination of care.  Follow-Up Instructions: No follow-ups on file.   Pollyann Savoy, MD  Note - This record has been created using Animal nutritionist.  Chart creation errors have been sought, but may not always  have been located. Such creation errors do not reflect on  the standard of medical care.

## 2023-10-27 ENCOUNTER — Ambulatory Visit: Payer: 59 | Attending: Rheumatology | Admitting: Rheumatology

## 2023-10-27 ENCOUNTER — Encounter: Payer: Self-pay | Admitting: Rheumatology

## 2023-10-27 VITALS — BP 135/78 | HR 73 | Resp 14 | Ht 63.0 in | Wt 143.0 lb

## 2023-10-27 DIAGNOSIS — M19042 Primary osteoarthritis, left hand: Secondary | ICD-10-CM

## 2023-10-27 DIAGNOSIS — E559 Vitamin D deficiency, unspecified: Secondary | ICD-10-CM

## 2023-10-27 DIAGNOSIS — M818 Other osteoporosis without current pathological fracture: Secondary | ICD-10-CM

## 2023-10-27 DIAGNOSIS — Z8719 Personal history of other diseases of the digestive system: Secondary | ICD-10-CM

## 2023-10-27 DIAGNOSIS — F419 Anxiety disorder, unspecified: Secondary | ICD-10-CM

## 2023-10-27 DIAGNOSIS — Z5181 Encounter for therapeutic drug level monitoring: Secondary | ICD-10-CM | POA: Diagnosis not present

## 2023-10-27 DIAGNOSIS — L309 Dermatitis, unspecified: Secondary | ICD-10-CM

## 2023-10-27 DIAGNOSIS — F17200 Nicotine dependence, unspecified, uncomplicated: Secondary | ICD-10-CM

## 2023-10-27 DIAGNOSIS — E782 Mixed hyperlipidemia: Secondary | ICD-10-CM

## 2023-10-27 DIAGNOSIS — Z8781 Personal history of (healed) traumatic fracture: Secondary | ICD-10-CM

## 2023-10-27 DIAGNOSIS — M4004 Postural kyphosis, thoracic region: Secondary | ICD-10-CM

## 2023-10-27 DIAGNOSIS — E041 Nontoxic single thyroid nodule: Secondary | ICD-10-CM

## 2023-10-27 DIAGNOSIS — J439 Emphysema, unspecified: Secondary | ICD-10-CM

## 2023-10-27 DIAGNOSIS — F32A Depression, unspecified: Secondary | ICD-10-CM

## 2023-10-27 DIAGNOSIS — M19041 Primary osteoarthritis, right hand: Secondary | ICD-10-CM

## 2023-10-27 DIAGNOSIS — R011 Cardiac murmur, unspecified: Secondary | ICD-10-CM

## 2023-10-27 DIAGNOSIS — Z87891 Personal history of nicotine dependence: Secondary | ICD-10-CM

## 2023-10-27 DIAGNOSIS — M47816 Spondylosis without myelopathy or radiculopathy, lumbar region: Secondary | ICD-10-CM

## 2023-10-27 DIAGNOSIS — I10 Essential (primary) hypertension: Secondary | ICD-10-CM

## 2023-10-27 NOTE — Patient Instructions (Signed)
 Romosozumab Injection  What is this medication?  ROMOSOZUMAB (roe moe SOZ ue mab) prevents and treats osteoporosis. It works by Interior and spatial designer stronger and less likely to break (fracture). It is a monoclonal antibody.  This medicine may be used for other purposes; ask your health care provider or pharmacist if you have questions.  COMMON BRAND NAME(S): EVENITY  What should I tell my care team before I take this medication?  They need to know if you have any of these conditions:  Dental disease  Heart attack  Heart disease  Kidney problems  Low levels of calcium in the blood  On dialysis  Stroke  Wear dentures  An unusual or allergic reaction to romosozumab, other medications, foods, dyes or preservatives  Pregnant or trying to get pregnant  Breast-feeding  How should I use this medication?  This medication is injected under the skin. It is given by your care team in a hospital or clinic setting.  A special MedGuide will be given to you by the pharmacist with each prescription and refill. Be sure to read this information carefully each time.  Talk to your care team about the use of this medication in children. Special care may be needed.  Overdosage: If you think you have taken too much of this medicine contact a poison control center or emergency room at once.  NOTE: This medicine is only for you. Do not share this medicine with others.  What if I miss a dose?  Keep appointments for follow-up doses. It is important not to miss your dose. Call your care team if you are unable to keep an appointment.  What may interact with this medication?  Interactions are not expected.  This list may not describe all possible interactions. Give your health care provider a list of all the medicines, herbs, non-prescription drugs, or dietary supplements you use. Also tell them if you smoke, drink alcohol, or use illegal drugs. Some items may interact with your medicine.  What should I watch for while using this medication?  Your  condition will be monitored carefully while you are receiving this medication.  You may need bloodwork while taking this medication.  You should make sure you get enough calcium and vitamin D while you are taking this medication. Discuss the foods you eat and the vitamins you take with your care team.  Some people who take this medication have severe bone, joint, or muscle pain. This medication may also increase your risk for jaw problems or a broken thigh bone. Tell your care team right away if you have severe pain in your jaw, bones, joints, or muscles. Tell you care team if you have any pain that does not go away or that gets worse.  Tell your dentist and dental surgeon that you are taking this medication. You should not have major dental surgery while on this medication. See your dentist to have a dental exam and fix any dental problems before starting this medication. Take good care of your teeth while on this medication. Make sure you see your dentist for regular follow-up appointments.  What side effects may I notice from receiving this medication?  Side effects that you should report to your care team as soon as possible:  Allergic reactions or angioedema--skin rash, itching or hives, swelling of the face, eyes, lips, tongue, arms, or legs, trouble swallowing or breathing  Heart attack--pain or tightness in the chest, shoulders, arms, or jaw, nausea, shortness of breath, cold or clammy skin,  feeling faint or lightheaded  Low calcium level--muscle pain or cramps, confusion, tingling, or numbness in the hands or feet  Osteonecrosis of the jaw--pain, swelling, or redness in the mouth, numbness of the jaw, poor healing after dental work, unusual discharge from the mouth, visible bones in the mouth  Severe bone, joint, or muscle pain  Stroke--sudden numbness or weakness of the face, arm, or leg, trouble speaking, confusion, trouble walking, loss of balance or coordination, dizziness, severe headache, change in  vision  Side effects that usually do not require medical attention (report to your care team if they continue or are bothersome):  Headache  Joint pain  Muscle spasms  Pain, redness, or irritation at injection site  Swelling of the ankles, hands, or feet  This list may not describe all possible side effects. Call your doctor for medical advice about side effects. You may report side effects to FDA at 1-800-FDA-1088.  Where should I keep my medication?  This medication is given in a hospital or clinic. It will not be stored at home.  NOTE: This sheet is a summary. It may not cover all possible information. If you have questions about this medicine, talk to your doctor, pharmacist, or health care provider.   2024 Elsevier/Gold Standard (2021-09-26 00:00:00)

## 2023-10-28 ENCOUNTER — Telehealth: Payer: Self-pay

## 2023-10-28 ENCOUNTER — Other Ambulatory Visit: Payer: Self-pay | Admitting: Pharmacist

## 2023-10-28 NOTE — Progress Notes (Signed)
Therapy plan placed for Evenity SQ [phone]) for [facility] to start benefits investigation. She prefers [place] since she works in [employer]  Diagnosis: age-related osteoporosis  Provider: Dr. Pollyann Savoy  Dose: 210mg  (as two divided injections) every months x 12 months  Last Clinic Visit: 10/27/2023 Next Clinic Visit: 01/26/2024  Pertinent Labs: CMP 10/02/2023 , TSH on 01/23/22 wnl,  phosphorous and PTH on 01/23/22  Chesley Mires, PharmD, MPH, BCPS, CPP Clinical Pharmacist (Rheumatology and Pulmonology)

## 2023-10-28 NOTE — Telephone Encounter (Signed)
Dr. Corliss Skains and [facility], patient will be scheduled as soon as possible.  Auth Submission: APPROVED Site of care: Site of care: [facility] Payer: UHC commercial Medication & CPT/J Code(s) submitted: Evenity (Romosozumab) [id number] Route of submission (phone, fax, portal): portal Phone # Fax # Auth type: Buy/Bill PB Units/visits requested: 210mg  x 12 doses Reference number: [id number] Approval from: 10/28/23 to 10/27/24

## 2023-11-07 ENCOUNTER — Other Ambulatory Visit: Payer: Self-pay | Admitting: Nurse Practitioner

## 2023-11-07 DIAGNOSIS — F17200 Nicotine dependence, unspecified, uncomplicated: Secondary | ICD-10-CM

## 2023-11-12 ENCOUNTER — Ambulatory Visit: Payer: 59

## 2023-11-12 MED ORDER — ROMOSOZUMAB-AQQG 105 MG/1.17ML ~~LOC~~ SOSY
210.0000 mg | PREFILLED_SYRINGE | Freq: Once | SUBCUTANEOUS | Status: DC
Start: 2023-11-12 — End: 2023-11-14

## 2023-11-14 ENCOUNTER — Encounter: Payer: Self-pay | Admitting: Rheumatology

## 2023-11-17 ENCOUNTER — Ambulatory Visit

## 2023-11-17 VITALS — BP 161/96 | HR 73 | Temp 97.8°F | Resp 20 | Ht 63.0 in | Wt 147.8 lb

## 2023-11-17 DIAGNOSIS — M818 Other osteoporosis without current pathological fracture: Secondary | ICD-10-CM

## 2023-11-17 DIAGNOSIS — Z8781 Personal history of (healed) traumatic fracture: Secondary | ICD-10-CM

## 2023-11-17 MED ORDER — ROMOSOZUMAB-AQQG 105 MG/1.17ML ~~LOC~~ SOSY
210.0000 mg | PREFILLED_SYRINGE | Freq: Once | SUBCUTANEOUS | Status: AC
  Administered 2023-11-17: 210 mg via SUBCUTANEOUS
  Filled 2023-11-17: qty 2.34

## 2023-11-17 NOTE — Patient Instructions (Signed)
 Romosozumab Injection  What is this medication?  ROMOSOZUMAB (roe moe SOZ ue mab) prevents and treats osteoporosis. It works by Interior and spatial designer stronger and less likely to break (fracture). It is a monoclonal antibody.  This medicine may be used for other purposes; ask your health care provider or pharmacist if you have questions.  COMMON BRAND NAME(S): EVENITY  What should I tell my care team before I take this medication?  They need to know if you have any of these conditions:  Dental disease  Heart attack  Heart disease  Kidney problems  Low levels of calcium in the blood  On dialysis  Stroke  Wear dentures  An unusual or allergic reaction to romosozumab, other medications, foods, dyes or preservatives  Pregnant or trying to get pregnant  Breast-feeding  How should I use this medication?  This medication is injected under the skin. It is given by your care team in a hospital or clinic setting.  A special MedGuide will be given to you by the pharmacist with each prescription and refill. Be sure to read this information carefully each time.  Talk to your care team about the use of this medication in children. Special care may be needed.  Overdosage: If you think you have taken too much of this medicine contact a poison control center or emergency room at once.  NOTE: This medicine is only for you. Do not share this medicine with others.  What if I miss a dose?  Keep appointments for follow-up doses. It is important not to miss your dose. Call your care team if you are unable to keep an appointment.  What may interact with this medication?  Interactions are not expected.  This list may not describe all possible interactions. Give your health care provider a list of all the medicines, herbs, non-prescription drugs, or dietary supplements you use. Also tell them if you smoke, drink alcohol, or use illegal drugs. Some items may interact with your medicine.  What should I watch for while using this medication?  Your  condition will be monitored carefully while you are receiving this medication.  You may need bloodwork while taking this medication.  You should make sure you get enough calcium and vitamin D while you are taking this medication. Discuss the foods you eat and the vitamins you take with your care team.  Some people who take this medication have severe bone, joint, or muscle pain. This medication may also increase your risk for jaw problems or a broken thigh bone. Tell your care team right away if you have severe pain in your jaw, bones, joints, or muscles. Tell you care team if you have any pain that does not go away or that gets worse.  Tell your dentist and dental surgeon that you are taking this medication. You should not have major dental surgery while on this medication. See your dentist to have a dental exam and fix any dental problems before starting this medication. Take good care of your teeth while on this medication. Make sure you see your dentist for regular follow-up appointments.  What side effects may I notice from receiving this medication?  Side effects that you should report to your care team as soon as possible:  Allergic reactions or angioedema--skin rash, itching or hives, swelling of the face, eyes, lips, tongue, arms, or legs, trouble swallowing or breathing  Heart attack--pain or tightness in the chest, shoulders, arms, or jaw, nausea, shortness of breath, cold or clammy skin,  feeling faint or lightheaded  Low calcium level--muscle pain or cramps, confusion, tingling, or numbness in the hands or feet  Osteonecrosis of the jaw--pain, swelling, or redness in the mouth, numbness of the jaw, poor healing after dental work, unusual discharge from the mouth, visible bones in the mouth  Severe bone, joint, or muscle pain  Stroke--sudden numbness or weakness of the face, arm, or leg, trouble speaking, confusion, trouble walking, loss of balance or coordination, dizziness, severe headache, change in  vision  Side effects that usually do not require medical attention (report to your care team if they continue or are bothersome):  Headache  Joint pain  Muscle spasms  Pain, redness, or irritation at injection site  Swelling of the ankles, hands, or feet  This list may not describe all possible side effects. Call your doctor for medical advice about side effects. You may report side effects to FDA at 1-800-FDA-1088.  Where should I keep my medication?  This medication is given in a hospital or clinic. It will not be stored at home.  NOTE: This sheet is a summary. It may not cover all possible information. If you have questions about this medicine, talk to your doctor, pharmacist, or health care provider.   2024 Elsevier/Gold Standard (2021-09-26 00:00:00)

## 2023-11-17 NOTE — Progress Notes (Signed)
 Diagnosis: Osteoporosis  Provider:  Chilton Greathouse MD  Procedure: Injection  Evenity (Romosozumab-aqqg), Dose: 200 mg, Site: subcutaneous, Number of injections: 2  Injection Site(s): Left upper quad. abdomen and Right upper quad. abdomen  Post Care: Observation period completed  Discharge: Condition: Good, Destination: Home . AVS Provided  Performed by:  Nat Math, RN

## 2023-12-15 ENCOUNTER — Ambulatory Visit (INDEPENDENT_AMBULATORY_CARE_PROVIDER_SITE_OTHER)

## 2023-12-15 VITALS — BP 162/79 | HR 60 | Temp 97.7°F | Resp 18 | Ht 63.0 in | Wt 147.2 lb

## 2023-12-15 DIAGNOSIS — Z8781 Personal history of (healed) traumatic fracture: Secondary | ICD-10-CM | POA: Diagnosis not present

## 2023-12-15 DIAGNOSIS — M818 Other osteoporosis without current pathological fracture: Secondary | ICD-10-CM

## 2023-12-15 MED ORDER — ROMOSOZUMAB-AQQG 105 MG/1.17ML ~~LOC~~ SOSY
210.0000 mg | PREFILLED_SYRINGE | Freq: Once | SUBCUTANEOUS | Status: AC
  Administered 2023-12-15: 210 mg via SUBCUTANEOUS
  Filled 2023-12-15: qty 2.34

## 2023-12-15 NOTE — Progress Notes (Signed)
 Diagnosis: Osteoporosis  Provider:  Chilton Greathouse MD  Procedure: Injection  Evenity (Romosozumab-aqqg), Dose: 210 mg, Site: subcutaneous, Number of injections: 2  Injection Site(s): Left lower quad. abdomen and Right lower quad. abdomne  Post Care:  right and left lower quad abdominal injections  Discharge: Condition: Good, Destination: Home . AVS Declined  Performed by:  Rico Ala, LPN

## 2023-12-23 ENCOUNTER — Ambulatory Visit: Payer: 59 | Admitting: Rheumatology

## 2024-01-12 ENCOUNTER — Ambulatory Visit

## 2024-01-12 VITALS — BP 157/92 | HR 72 | Temp 98.6°F | Resp 16 | Ht 63.0 in | Wt 149.8 lb

## 2024-01-12 DIAGNOSIS — M818 Other osteoporosis without current pathological fracture: Secondary | ICD-10-CM | POA: Diagnosis not present

## 2024-01-12 DIAGNOSIS — Z8781 Personal history of (healed) traumatic fracture: Secondary | ICD-10-CM | POA: Diagnosis not present

## 2024-01-12 MED ORDER — ROMOSOZUMAB-AQQG 105 MG/1.17ML ~~LOC~~ SOSY
210.0000 mg | PREFILLED_SYRINGE | Freq: Once | SUBCUTANEOUS | Status: AC
  Administered 2024-01-12: 210 mg via SUBCUTANEOUS
  Filled 2024-01-12: qty 2.34

## 2024-01-12 NOTE — Progress Notes (Signed)
 Diagnosis: Osteoporosis  Provider:  Mannam, Praveen MD  Procedure: Injection  Evenity  (Romosozumab -aqqg), Dose: 210 mg, Site: subcutaneous, Number of injections: 2  Injection Site(s): Left lower quad. abdomen and Right lower quad. abdomne  Post Care:  left and right lower quad abdominal injections  Discharge: Condition: Good, Destination: Home . AVS Declined  Performed by:  Lauran Pollard, LPN

## 2024-01-14 NOTE — Progress Notes (Deleted)
 Office Visit Note  Patient: Janet Randolph             Date of Birth: [birth year]           MRN: [id number]             PCP: Delfina Feller, FNP Referring: Delfina Feller, * Visit Date: 01/26/2024 Occupation: @GUAROCC @  Subjective:  No chief complaint on file.   History of Present Illness: Janet Randolph is a 63 y.o. female ***     Activities of Daily Living:  Patient reports morning stiffness for *** {minute/hour:19697}.   Patient {ACTIONS;DENIES/REPORTS:21021675::"Denies"} nocturnal pain.  Difficulty dressing/grooming: {ACTIONS;DENIES/REPORTS:21021675::"Denies"} Difficulty climbing stairs: {ACTIONS;DENIES/REPORTS:21021675::"Denies"} Difficulty getting out of chair: {ACTIONS;DENIES/REPORTS:21021675::"Denies"} Difficulty using hands for taps, buttons, cutlery, and/or writing: {ACTIONS;DENIES/REPORTS:21021675::"Denies"}  No Rheumatology ROS completed.   PMFS History:  Patient Active Problem List   Diagnosis Date Noted   Age-related osteoporosis without fracture 12/20/2022   History of wrist fracture 12/20/2022   GERD (gastroesophageal reflux disease) 04/03/2016   Eczema of both hands 07/12/2015   GAD (generalized anxiety disorder) 06/04/2013   Hyperlipidemia 03/05/2013   Hypertension 03/05/2013   Depression 02/09/2013    Past Medical History:  Diagnosis Date   Colitis    Depression    Eczema    Hyperlipidemia    Hypertension    Spinal stenosis 2024   Vitamin D  deficiency     Family History  Problem Relation Age of Onset   Melanoma Mother    Asthma Father    Crohn's disease Father    Melanoma Father    Asthma Brother    Heart Problems Brother    Stroke Brother    Healthy Son    Healthy Daughter    Past Surgical History:  Procedure Laterality Date   HAND SURGERY Bilateral    MOLE REMOVAL  06/12/2022   TEE WITHOUT CARDIOVERSION N/A 01/02/2022   Procedure: TRANSESOPHAGEAL ECHOCARDIOGRAM (TEE);  Surgeon: Lenise Quince, MD;   Location: [facility] ENDOSCOPY;  Service: Cardiovascular;  Laterality: N/A;   Social History   Social History Narrative   Not on file   Immunization History  Administered Date(s) Administered   PFIZER(Purple Top)SARS-COV-2 Vaccination 11/25/2019, 12/20/2019, 08/29/2020   Tdap 07/23/2010, 07/20/2021   Zoster Recombinant(Shingrix ) 09/28/2021, 09/30/2022     Objective: Vital Signs: There were no vitals taken for this visit.   Physical Exam   Musculoskeletal Exam: ***  CDAI Exam: CDAI Score: -- Patient Global: --; Provider Global: -- Swollen: --; Tender: -- Joint Exam 01/26/2024   No joint exam has been documented for this visit   There is currently no information documented on the homunculus. Go to the Rheumatology activity and complete the homunculus joint exam.  Investigation: No additional findings.  Imaging: No results found.  Recent Labs: Lab Results  Component Value Date   WBC 6.3 10/02/2023   HGB 11.9 10/02/2023   PLT 375 10/02/2023   NA 143 10/02/2023   K 4.4 10/02/2023   CL 104 10/02/2023   CO2 23 10/02/2023   GLUCOSE 80 10/02/2023   BUN 21 10/02/2023   CREATININE 0.67 10/02/2023   BILITOT <0.2 10/02/2023   ALKPHOS 60 10/02/2023   AST 17 10/02/2023   ALT 11 10/02/2023   PROT 6.4 10/02/2023   ALBUMIN 4.1 10/02/2023   CALCIUM  9.1 10/02/2023   GFRAA 112 02/22/2020    Speciality Comments: Forteo  05/23-02/24-took 50% of the time  Procedures:  No procedures performed Allergies: Latex and Penicillins   Assessment / Plan:  Visit Diagnoses: No diagnosis found.  Orders: No orders of the defined types were placed in this encounter.  No orders of the defined types were placed in this encounter.   Face-to-face time spent with patient was *** minutes. Greater than 50% of time was spent in counseling and coordination of care.  Follow-Up Instructions: No follow-ups on file.   Dee Farber, CMA  Note - This record has been created using Barista.  Chart creation errors have been sought, but may not always  have been located. Such creation errors do not reflect on  the standard of medical care.

## 2024-01-26 ENCOUNTER — Ambulatory Visit: Payer: 59 | Admitting: Physician Assistant

## 2024-01-26 DIAGNOSIS — F419 Anxiety disorder, unspecified: Secondary | ICD-10-CM

## 2024-01-26 DIAGNOSIS — Z8781 Personal history of (healed) traumatic fracture: Secondary | ICD-10-CM

## 2024-01-26 DIAGNOSIS — E041 Nontoxic single thyroid nodule: Secondary | ICD-10-CM

## 2024-01-26 DIAGNOSIS — Z8719 Personal history of other diseases of the digestive system: Secondary | ICD-10-CM

## 2024-01-26 DIAGNOSIS — J439 Emphysema, unspecified: Secondary | ICD-10-CM

## 2024-01-26 DIAGNOSIS — Z5181 Encounter for therapeutic drug level monitoring: Secondary | ICD-10-CM

## 2024-01-26 DIAGNOSIS — E559 Vitamin D deficiency, unspecified: Secondary | ICD-10-CM

## 2024-01-26 DIAGNOSIS — E782 Mixed hyperlipidemia: Secondary | ICD-10-CM

## 2024-01-26 DIAGNOSIS — Z87891 Personal history of nicotine dependence: Secondary | ICD-10-CM

## 2024-01-26 DIAGNOSIS — M19041 Primary osteoarthritis, right hand: Secondary | ICD-10-CM

## 2024-01-26 DIAGNOSIS — M47816 Spondylosis without myelopathy or radiculopathy, lumbar region: Secondary | ICD-10-CM

## 2024-01-26 DIAGNOSIS — R011 Cardiac murmur, unspecified: Secondary | ICD-10-CM

## 2024-01-26 DIAGNOSIS — M818 Other osteoporosis without current pathological fracture: Secondary | ICD-10-CM

## 2024-01-26 DIAGNOSIS — I10 Essential (primary) hypertension: Secondary | ICD-10-CM

## 2024-01-26 DIAGNOSIS — M4004 Postural kyphosis, thoracic region: Secondary | ICD-10-CM

## 2024-01-26 DIAGNOSIS — L309 Dermatitis, unspecified: Secondary | ICD-10-CM

## 2024-01-26 NOTE — Progress Notes (Signed)
 Office Visit Note  Patient: Janet Randolph             Date of Birth: 06-Nov-1960           MRN: [id number]             PCP: Delfina Feller, FNP Referring: Delfina Feller, * Visit Date: 01/28/2024 Occupation: @GUAROCC @  Subjective:  Medication monitoring   History of Present Illness: [name] is a 63 y.o. female with history of osteoporosis. Patient is currently receiving monthly evenity  injections for management of osteoporosis.  Evenity  was started on 11/17/23.  2nd dose 12/15/23 and 3rd dose 01/12/24.  She has tolerated Evenity  without any side effects or injection site reactions.  She has been taking a calcium  and vitamin D  supplement daily.  She denies any falls or fractures.  Patient experiences some stiffness first thing in the mornings but denies any joint swelling.  She alternates taking Tylenol  and ibuprofen as needed for pain relief.  She denies any new medical conditions.  Activities of Daily Living:  Patient reports morning stiffness for 30 minutes.   Patient Denies nocturnal pain.  Difficulty dressing/grooming: Denies Difficulty climbing stairs: Denies Difficulty getting out of chair: Denies Difficulty using hands for taps, buttons, cutlery, and/or writing: Denies  Review of Systems  Constitutional:  Negative for fatigue.  HENT:  Positive for mouth sores and mouth dryness.   Eyes:  Positive for dryness.  Respiratory:  Positive for shortness of breath.   Cardiovascular:  Negative for chest pain and palpitations.  Gastrointestinal:  Positive for constipation and diarrhea. Negative for blood in stool.  Endocrine: Negative for increased urination.  Genitourinary:  Positive for involuntary urination.  Musculoskeletal:  Positive for joint pain, joint pain, myalgias, morning stiffness and myalgias. Negative for gait problem, joint swelling, muscle weakness and muscle tenderness.  Skin:  Negative for color change, rash, hair loss and sensitivity to  sunlight.  Allergic/Immunologic: Negative for susceptible to infections.  Neurological:  Positive for headaches. Negative for dizziness.  Hematological:  Negative for swollen glands.  Psychiatric/Behavioral:  Negative for depressed mood and sleep disturbance. The patient is nervous/anxious.     PMFS History:  Patient Active Problem List   Diagnosis Date Noted   Age-related osteoporosis without fracture 12/20/2022   History of wrist fracture 12/20/2022   GERD (gastroesophageal reflux disease) 04/03/2016   Eczema of both hands 07/12/2015   GAD (generalized anxiety disorder) 06/04/2013   Hyperlipidemia 03/05/2013   Hypertension 03/05/2013   Depression 02/09/2013    Past Medical History:  Diagnosis Date   Colitis    Depression    Eczema    Hyperlipidemia    Hypertension    Spinal stenosis 2024   Vitamin D  deficiency     Family History  Problem Relation Age of Onset   Melanoma Mother    Asthma Father    Crohn's disease Father    Melanoma Father    Asthma Brother    Heart Problems Brother    Stroke Brother    Healthy Son    Healthy Daughter    Past Surgical History:  Procedure Laterality Date   HAND SURGERY Bilateral    MOLE REMOVAL  06/12/2022   TEE WITHOUT CARDIOVERSION N/A 01/02/2022   Procedure: TRANSESOPHAGEAL ECHOCARDIOGRAM (TEE);  Surgeon: Lenise Quince, MD;  Location: [facility] ENDOSCOPY;  Service: Cardiovascular;  Laterality: N/A;   Social History   Social History Narrative   Not on file   Immunization History  Administered Date(s) Administered  PFIZER(Purple Top)SARS-COV-2 Vaccination 11/25/2019, 12/20/2019, 08/29/2020   Tdap 07/23/2010, 07/20/2021   Zoster Recombinant(Shingrix ) 09/28/2021, 09/30/2022     Objective: Vital Signs: BP (!) 147/84 (BP Location: Left Arm, Patient Position: Sitting, Cuff Size: Normal)   Pulse 76   Resp 16   Ht 5\' 3"  (1.6 m)   Wt 154 lb 3.2 oz (69.9 kg)   BMI 27.32 kg/m    Physical Exam Vitals and nursing note reviewed.   Constitutional:      Appearance: She is well-developed.  HENT:     Head: Normocephalic and atraumatic.  Eyes:     Conjunctiva/sclera: Conjunctivae normal.  Cardiovascular:     Rate and Rhythm: Normal rate and regular rhythm.     Heart sounds: Normal heart sounds.  Pulmonary:     Effort: Pulmonary effort is normal.     Breath sounds: Normal breath sounds.  Abdominal:     General: Bowel sounds are normal.     Palpations: Abdomen is soft.  Musculoskeletal:     Cervical back: Normal range of motion.  Lymphadenopathy:     Cervical: No cervical adenopathy.  Skin:    General: Skin is warm and dry.     Capillary Refill: Capillary refill takes less than 2 seconds.  Neurological:     Mental Status: She is alert and oriented to person, place, and time.  Psychiatric:        Behavior: Behavior normal.      Musculoskeletal Exam: C-spine has good ROM. Postural thoracic kyphosis.  Shoulder joints, elbow joints, wrist joints, MCPs, PIPs, DIPs have good range of motion with no synovitis.  PIP and DIP thickening consistent with osteoarthritis of both hands.  Subluxation of several DIP joints noted.  Complete fist formation bilaterally.  Hip joints have good range of motion with no groin pain.  Knee joints have good range of motion with no warmth or effusion.  Ankle joints have good range of motion with no tenderness or joint swelling.  CDAI Exam: CDAI Score: -- Patient Global: --; Provider Global: -- Swollen: --; Tender: -- Joint Exam 01/28/2024   No joint exam has been documented for this visit   There is currently no information documented on the homunculus. Go to the Rheumatology activity and complete the homunculus joint exam.  Investigation: No additional findings.  Imaging: No results found.  Recent Labs: Lab Results  Component Value Date   WBC 6.3 10/02/2023   HGB 11.9 10/02/2023   PLT 375 10/02/2023   NA 143 10/02/2023   K 4.4 10/02/2023   CL 104 10/02/2023   CO2 23  10/02/2023   GLUCOSE 80 10/02/2023   BUN 21 10/02/2023   CREATININE 0.67 10/02/2023   BILITOT <0.2 10/02/2023   ALKPHOS 60 10/02/2023   AST 17 10/02/2023   ALT 11 10/02/2023   PROT 6.4 10/02/2023   ALBUMIN 4.1 10/02/2023   CALCIUM  9.1 10/02/2023   GFRAA 112 02/22/2020    Speciality Comments: Forteo  05/23-02/24-took 50% of the time  Procedures:  No procedures performed Allergies: Latex and Penicillins    Assessment / Plan:     Visit Diagnoses: Other osteoporosis without current pathological fracture: 10/22/23 The BMD measured at Femur Neck Right is 0.577 g/cm2 with a T-score of -3.3. h/o both wrist fracture, former smoker. Forteo :05/23-02/24,  Reclast  04/24.  She did not have any improvement in the BMD.  Patient was started on Evenity  on 11/17/23.  The second dose was administered on 12/15/2023 and the third dose was administered on 01/12/2024.  She has been tolerating Evenity  without any side effects or injection site reactions.  Patient is willing to continue Evenity  for a total of 12 doses as recommended.  She plans on continuing to take a calcium  and vitamin D  supplement daily.  She has not had any recent falls or fractures.  Discussed the importance of performing resistive exercises. Will hold off Reclast  while on Evenity  x1 year and then will resume Reclast  after the course of Evenity  is complete.  Due to update DEXA in February 2027.  Medication monitoring encounter: Evenity  monthly injections initiated 11/17/2023.  Vitamin D  deficiency: Vitamin D  within normal limits: 40 on 06/17/2022.  She has been taking a calcium  and vitamin D  supplement daily.  History of wrist fracture: Left wrist fracture in 2020 and right wrist fracture in 2021 after a fall   Primary osteoarthritis of both hands: She experiences intermittent joint stiffness particularly in the mornings.  She has no synovitis on examination today.  Mild osteoarthritic changes were noted.  Discussed the importance of joint  protection and muscle strengthening.  Postural kyphosis of thoracic region: Postural thoracic kyphosis noted.  Lumbar spondylosis: Intermittent discomfort.  Patient alternates Tylenol  and ibuprofen as needed for symptomatic relief.  Other medical conditions are listed as follows:  Primary hypertension: Blood pressure was elevated today in the office and was rechecked prior to leaving.  Patient was advised to monitor blood pressure closely and to reach out to PCP if her blood pressure remains elevated.  Mixed hyperlipidemia  Murmur  Eczema of both hands  History of gastroesophageal reflux (GERD)  Thyroid  nodule  History of emphysema (HCC)  Anxiety and depression  Former smoker  Orders: No orders of the defined types were placed in this encounter.  No orders of the defined types were placed in this encounter.   Follow-Up Instructions: Return in about 6 months (around 07/30/2024) for Osteoporosis.   Romayne Clubs, PA-C  Note - This record has been created using Dragon software.  Chart creation errors have been sought, but may not always  have been located. Such creation errors do not reflect on  the standard of medical care.

## 2024-01-28 ENCOUNTER — Encounter: Payer: Self-pay | Admitting: Physician Assistant

## 2024-01-28 ENCOUNTER — Ambulatory Visit: Attending: Physician Assistant | Admitting: Physician Assistant

## 2024-01-28 VITALS — BP 147/84 | HR 76 | Resp 16 | Ht 63.0 in | Wt 154.2 lb

## 2024-01-28 DIAGNOSIS — M818 Other osteoporosis without current pathological fracture: Secondary | ICD-10-CM | POA: Diagnosis not present

## 2024-01-28 DIAGNOSIS — R011 Cardiac murmur, unspecified: Secondary | ICD-10-CM

## 2024-01-28 DIAGNOSIS — J439 Emphysema, unspecified: Secondary | ICD-10-CM

## 2024-01-28 DIAGNOSIS — Z87891 Personal history of nicotine dependence: Secondary | ICD-10-CM

## 2024-01-28 DIAGNOSIS — Z8781 Personal history of (healed) traumatic fracture: Secondary | ICD-10-CM

## 2024-01-28 DIAGNOSIS — F32A Depression, unspecified: Secondary | ICD-10-CM

## 2024-01-28 DIAGNOSIS — E559 Vitamin D deficiency, unspecified: Secondary | ICD-10-CM

## 2024-01-28 DIAGNOSIS — Z5181 Encounter for therapeutic drug level monitoring: Secondary | ICD-10-CM | POA: Diagnosis not present

## 2024-01-28 DIAGNOSIS — E782 Mixed hyperlipidemia: Secondary | ICD-10-CM

## 2024-01-28 DIAGNOSIS — F419 Anxiety disorder, unspecified: Secondary | ICD-10-CM

## 2024-01-28 DIAGNOSIS — L309 Dermatitis, unspecified: Secondary | ICD-10-CM

## 2024-01-28 DIAGNOSIS — Z8719 Personal history of other diseases of the digestive system: Secondary | ICD-10-CM

## 2024-01-28 DIAGNOSIS — M19042 Primary osteoarthritis, left hand: Secondary | ICD-10-CM

## 2024-01-28 DIAGNOSIS — E041 Nontoxic single thyroid nodule: Secondary | ICD-10-CM

## 2024-01-28 DIAGNOSIS — M19041 Primary osteoarthritis, right hand: Secondary | ICD-10-CM

## 2024-01-28 DIAGNOSIS — M4004 Postural kyphosis, thoracic region: Secondary | ICD-10-CM

## 2024-01-28 DIAGNOSIS — I10 Essential (primary) hypertension: Secondary | ICD-10-CM

## 2024-01-28 DIAGNOSIS — M47816 Spondylosis without myelopathy or radiculopathy, lumbar region: Secondary | ICD-10-CM

## 2024-02-09 ENCOUNTER — Ambulatory Visit

## 2024-02-09 MED ORDER — ROMOSOZUMAB-AQQG 105 MG/1.17ML ~~LOC~~ SOSY
210.0000 mg | PREFILLED_SYRINGE | Freq: Once | SUBCUTANEOUS | Status: DC
Start: 2024-02-09 — End: 2024-02-09

## 2024-02-10 ENCOUNTER — Encounter: Payer: Self-pay | Admitting: Rheumatology

## 2024-02-13 ENCOUNTER — Ambulatory Visit

## 2024-02-13 MED ORDER — ROMOSOZUMAB-AQQG 105 MG/1.17ML ~~LOC~~ SOSY
210.0000 mg | PREFILLED_SYRINGE | Freq: Once | SUBCUTANEOUS | Status: DC
Start: 2024-02-13 — End: 2024-02-13

## 2024-02-16 ENCOUNTER — Ambulatory Visit

## 2024-02-16 VITALS — BP 157/92 | HR 79 | Temp 97.6°F | Resp 18 | Wt 152.0 lb

## 2024-02-16 DIAGNOSIS — M818 Other osteoporosis without current pathological fracture: Secondary | ICD-10-CM | POA: Diagnosis not present

## 2024-02-16 DIAGNOSIS — Z8781 Personal history of (healed) traumatic fracture: Secondary | ICD-10-CM

## 2024-02-16 MED ORDER — ROMOSOZUMAB-AQQG 105 MG/1.17ML ~~LOC~~ SOSY
210.0000 mg | PREFILLED_SYRINGE | Freq: Once | SUBCUTANEOUS | Status: AC
  Administered 2024-02-16: 210 mg via SUBCUTANEOUS
  Filled 2024-02-16: qty 2.34

## 2024-02-16 NOTE — Progress Notes (Signed)
 Diagnosis: Iron Deficiency Anemia  Provider:  Mannam, Praveen MD  Procedure: Injection  Evenity  (Romosozumab -aqqg), Dose: 210 mg, Site: subcutaneous, Number of injections: 2  Injection Site(s): Left lower quad. abdomen and Right lower quad. abdomne  Post Care: Patient declined observation  Discharge: Condition: Good, Destination: Home . AVS Declined  Performed by:  Star East, LPN

## 2024-03-15 ENCOUNTER — Ambulatory Visit

## 2024-03-15 VITALS — BP 146/82 | HR 76 | Temp 97.9°F | Resp 16 | Ht 63.0 in | Wt 153.6 lb

## 2024-03-15 DIAGNOSIS — M818 Other osteoporosis without current pathological fracture: Secondary | ICD-10-CM

## 2024-03-15 DIAGNOSIS — Z8781 Personal history of (healed) traumatic fracture: Secondary | ICD-10-CM

## 2024-03-15 MED ORDER — ROMOSOZUMAB-AQQG 105 MG/1.17ML ~~LOC~~ SOSY
210.0000 mg | PREFILLED_SYRINGE | Freq: Once | SUBCUTANEOUS | Status: AC
  Administered 2024-03-15: 210 mg via SUBCUTANEOUS
  Filled 2024-03-15: qty 2.34

## 2024-03-15 NOTE — Progress Notes (Signed)
 Diagnosis: Osteoporosis  Provider:  Mannam, Praveen MD  Procedure: Injection  Evenity  (Romosozumab -aqqg), Dose: 210 mg, Site: subcutaneous, Number of injections: 2  Injection Site(s): Left lower quad. abdomen and Right lower quad. abdomne  Post Care: Patient declined observation  Discharge: Condition: Good, Destination: Home . AVS Declined  Performed by:  Star East, LPN

## 2024-04-05 ENCOUNTER — Telehealth: Payer: Self-pay | Admitting: Nurse Practitioner

## 2024-04-06 ENCOUNTER — Ambulatory Visit: Payer: 59 | Admitting: Nurse Practitioner

## 2024-04-06 ENCOUNTER — Encounter: Payer: Self-pay | Admitting: Nurse Practitioner

## 2024-04-06 VITALS — BP 149/76 | HR 69 | Temp 96.8°F | Ht 63.0 in | Wt 151.0 lb

## 2024-04-06 DIAGNOSIS — E782 Mixed hyperlipidemia: Secondary | ICD-10-CM | POA: Diagnosis not present

## 2024-04-06 DIAGNOSIS — N3000 Acute cystitis without hematuria: Secondary | ICD-10-CM | POA: Diagnosis not present

## 2024-04-06 DIAGNOSIS — K219 Gastro-esophageal reflux disease without esophagitis: Secondary | ICD-10-CM

## 2024-04-06 DIAGNOSIS — I10 Essential (primary) hypertension: Secondary | ICD-10-CM | POA: Diagnosis not present

## 2024-04-06 DIAGNOSIS — Z0000 Encounter for general adult medical examination without abnormal findings: Secondary | ICD-10-CM

## 2024-04-06 DIAGNOSIS — Z6827 Body mass index (BMI) 27.0-27.9, adult: Secondary | ICD-10-CM

## 2024-04-06 DIAGNOSIS — R300 Dysuria: Secondary | ICD-10-CM

## 2024-04-06 DIAGNOSIS — Z0001 Encounter for general adult medical examination with abnormal findings: Secondary | ICD-10-CM | POA: Diagnosis not present

## 2024-04-06 DIAGNOSIS — F3342 Major depressive disorder, recurrent, in full remission: Secondary | ICD-10-CM

## 2024-04-06 DIAGNOSIS — F411 Generalized anxiety disorder: Secondary | ICD-10-CM

## 2024-04-06 LAB — URINALYSIS, ROUTINE W REFLEX MICROSCOPIC
Bilirubin, UA: NEGATIVE
Glucose, UA: NEGATIVE
Nitrite, UA: POSITIVE — AB
Specific Gravity, UA: 1.02 (ref 1.005–1.030)
Urobilinogen, Ur: 0.2 mg/dL (ref 0.2–1.0)
pH, UA: 6.5 (ref 5.0–7.5)

## 2024-04-06 LAB — MICROSCOPIC EXAMINATION: WBC, UA: >30 /HPF — AB (ref 0–5)

## 2024-04-06 MED ORDER — ALPRAZOLAM 0.5 MG PO TABS: 0.5000 mg | ORAL_TABLET | Freq: Two times a day (BID) | ORAL | 5 refills | Status: DC | PRN

## 2024-04-06 MED ORDER — LISINOPRIL 40 MG PO TABS: 40.0000 mg | ORAL_TABLET | Freq: Every day | ORAL | 1 refills | Status: DC

## 2024-04-06 MED ORDER — SULFAMETHOXAZOLE-TRIMETHOPRIM 800-160 MG PO TABS: 1.0000 | ORAL_TABLET | Freq: Two times a day (BID) | ORAL | 0 refills | Status: DC

## 2024-04-06 MED ORDER — ROSUVASTATIN CALCIUM 10 MG PO TABS: 10.0000 mg | ORAL_TABLET | Freq: Every day | ORAL | 1 refills | Status: DC

## 2024-04-06 MED ORDER — VENLAFAXINE HCL ER 75 MG PO CP24: 150.0000 mg | ORAL_CAPSULE | Freq: Every day | ORAL | 1 refills | Status: DC

## 2024-04-06 NOTE — Addendum Note (Signed)
 Addended by: Breelyn Icard, MARY-MARGARET on: 04/06/2024 10:59 AM   Modules accepted: Orders

## 2024-04-06 NOTE — Patient Instructions (Signed)
 Dysuria Dysuria is pain or discomfort when you pee. The pain may be felt in your urethra, which is the part of your body that drains pee (urine) from your bladder. The pain may also be felt near your genitals, groin, or in your lower belly or back. You may have to pee often or have the sudden feeling that you need to pee. This condition can affect anyone, but it's more common in females. It can be caused by: A urinary tract infection (UTI). Kidney stones or bladder stones. Some sexually transmitted infections (STIs). Dehydration. This is when there's not enough water in your body. Irritation and swelling in the vagina. The use of some medicines. The use of some soaps or products with a scent. Follow these instructions at home: Medicines  Take your medicines only as told. Take your antibiotics as told. Do not stop taking them even if you start to feel better. Eating and drinking Drink enough fluid to keep your pee pale yellow. Certain drinks can make the pain worse. Avoid: Drinks with caffeine in them. Tea. Alcohol. In males, alcohol may irritate the prostate. General instructions Watch your condition for any changes, such as color changes in your pee. Pee often. Do not hold your pee for a long time. If you're female, wipe from front to back after you pee or poop. Use each tissue only once when you wipe. Pee after you have sex. If you've had any tests done, it's up to you to get your test results. Ask your health care provider, or the department doing the test, when your results will be ready. Contact a health care provider if: You have a fever or chills. You have pain in your back or sides. You throw up or feel like you may throw up. You have blood in your pee. You're not peeing as often as normal. You feel very weak. Get help right away if: You have very bad pain that doesn't get better with medicine. You're confused. You have a fast heartbeat while resting. This information is  not intended to replace advice given to you by your health care provider. Make sure you discuss any questions you have with your health care provider. Document Revised: 12/31/2022 Document Reviewed: 12/31/2022 Elsevier Patient Education  2024 [employer].

## 2024-04-06 NOTE — Progress Notes (Signed)
 Subjective:    Patient ID: Janet Randolph, female    DOB: 02/04/1961, 63 y.o.   MRN: [id number]   Chief Complaint: annual physical     HPI:  Janet Randolph is a 63 y.o. who identifies as a female who was assigned female at birth.   Social history: Lives with: husband Work history: works for [employer] in today for follow up of the following chronic medical issues:  1. Primary hypertension No c/o chest pain, sob or headache. Doe snot check blood pressure at home. BP Readings from Last 3 Encounters:  03/15/24 (!) 146/82  02/16/24 (!) 157/92  01/28/24 (!) 147/84     2. Mixed hyperlipidemia Does not watch diet very closely and does no dedicated exercise. Lab Results  Component Value Date   CHOL 155 10/02/2023   HDL 62 10/02/2023   LDLCALC 80 10/02/2023   TRIG 63 10/02/2023   CHOLHDL 2.5 10/02/2023   The 10-year ASCVD risk score (Arnett DK, et al., 2019) is: 7.1%   3. Gastroesophageal reflux disease, unspecified whether esophagitis present Is on nexium  daily and works well for her  4. Recurrent major depressive disorder, in full remission (HCC) Has been on Effexor  for awhile and it works well for her.    04/06/2024   10:11 AM 10/02/2023   11:46 AM 08/06/2023   12:09 PM  Depression screen PHQ 2/9  Decreased Interest 1 1 1   Down, Depressed, Hopeless 1 1 1   PHQ - 2 Score 2 2 2   Altered sleeping 1 0 0  Tired, decreased energy 2 2 1   Change in appetite 2 1 3   Feeling bad or failure about yourself  2 1 2   Trouble concentrating 2 0 0  Moving slowly or fidgety/restless 0 0 0  Suicidal thoughts 0 0 0  PHQ-9 Score 11 6 8   Difficult doing work/chores Somewhat difficult Not difficult at all Not difficult at all       5. GAD (generalized anxiety disorder) Is on xanax  bid. Gets anxious without taking. Has been on this for years     10/02/2023   11:47 AM 08/06/2023   12:09 PM 03/31/2023    2:37 PM 09/30/2022    2:12 PM  GAD 7 : Generalized Anxiety  Score  Nervous, Anxious, on Edge 3 3 1 3   Control/stop worrying 1 1 0 2  Worry too much - different things 0 0 0 1  Trouble relaxing 2 1 0 2  Restless 1 1 0 0  Easily annoyed or irritable 2 1 2 2   Afraid - awful might happen 0 1 0 1  Total GAD 7 Score 9 8 3 11   Anxiety Difficulty Not difficult at all Not difficult at all Not difficult at all Somewhat difficult      6. BMI 27.0-27.9 Weight is down 2 lbs Wt Readings from Last 3 Encounters:  04/06/24 151 lb (68.5 kg)  03/15/24 153 lb 9.6 oz (69.7 kg)  02/16/24 152 lb (68.9 kg)   BMI Readings from Last 3 Encounters:  04/06/24 26.75 kg/m  03/15/24 27.21 kg/m  02/16/24 26.93 kg/m       New complaints: Dysuria- started about 2 day sago. Urinary frequency with nocturia. No fever, no back pain.  Allergies  Allergen Reactions   Latex Rash   Penicillins Rash   Outpatient Encounter Medications as of 04/06/2024  Medication Sig   acetaminophen  (TYLENOL ) 500 MG tablet Take 500 mg by mouth every 6 (six) hours as  needed.   ALPRAZolam  (XANAX ) 0.5 MG tablet Take 1 tablet (0.5 mg total) by mouth 2 (two) times daily as needed.   Aspirin-Acetaminophen -Caffeine (GOODY HEADACHE PO) Take 3 Packages by mouth daily as needed (headache).   CALCIUM  PO Take by mouth daily.   Cyanocobalamin (VITAMIN B-12 PO) Take by mouth daily.   cyclobenzaprine (FLEXERIL) 5 MG tablet Take 5 mg by mouth at bedtime. (Patient not taking: Reported on 01/28/2024)   desonide  (DESOWEN ) 0.05 % cream Apply topically 2 (two) times daily. (Patient taking differently: Apply 1 application  topically 2 (two) times daily as needed (rash).)   esomeprazole  (NEXIUM ) 40 MG capsule Take 1 capsule (40 mg total) by mouth daily. Take 30 min before meal   lisinopril  (ZESTRIL ) 20 MG tablet Take 1 tablet (20 mg total) by mouth daily.   loperamide (IMODIUM) 2 MG capsule Take 4 mg by mouth daily as needed for diarrhea or loose stools.   nicotine  (NICODERM CQ  - DOSED IN MG/24 HOURS) 21  mg/24hr patch PLACE 1 PATCH ONTO THE SKIN DAILY.   nitrofurantoin , macrocrystal-monohydrate, (MACROBID ) 100 MG capsule Take 1 capsule (100 mg total) by mouth 2 (two) times daily. 1 po BId (Patient not taking: Reported on 01/28/2024)   rosuvastatin  (CRESTOR ) 10 MG tablet Take 1 tablet (10 mg total) by mouth daily.   sulfamethoxazole -trimethoprim  (BACTRIM  DS) 800-160 MG tablet Take 1 tablet by mouth 2 (two) times daily. (Patient not taking: Reported on 01/28/2024)   Teriparatide  620 MCG/2.48ML SOPN  (Patient not taking: Reported on 01/28/2024)   venlafaxine  XR (EFFEXOR -XR) 75 MG 24 hr capsule Take 2 capsules (150 mg total) by mouth daily with breakfast.   VITAMIN D  PO Take 1,000 Units by mouth daily.   Zoledronic  Acid (RECLAST  IV) Inject into the vein. Infusion on 01/03/2023 (Patient not taking: Reported on 01/28/2024)   No facility-administered encounter medications on file as of 04/06/2024.    Past Surgical History:  Procedure Laterality Date   HAND SURGERY Bilateral    MOLE REMOVAL  06/12/2022   TEE WITHOUT CARDIOVERSION N/A 01/02/2022   Procedure: TRANSESOPHAGEAL ECHOCARDIOGRAM (TEE);  Surgeon: Pietro Redell RAMAN, MD;  Location: [facility] ENDOSCOPY;  Service: Cardiovascular;  Laterality: N/A;    Family History  Problem Relation Age of Onset   Melanoma Mother    Asthma Father    Crohn's disease Father    Melanoma Father    Asthma Brother    Heart Problems Brother    Stroke Brother    Healthy Son    Healthy Daughter       Controlled substance contract: n/a     Review of Systems  Constitutional:  Negative for diaphoresis.  Eyes:  Negative for pain.  Respiratory:  Negative for shortness of breath.   Cardiovascular:  Negative for chest pain, palpitations and leg swelling.  Gastrointestinal:  Negative for abdominal pain.  Endocrine: Negative for polydipsia.  Skin:  Negative for rash.  Neurological:  Negative for dizziness, weakness and headaches.  Hematological:  Does not bruise/bleed  easily.  All other systems reviewed and are negative.      Objective:   Physical Exam Vitals and nursing note reviewed.  Constitutional:      General: She is not in acute distress.    Appearance: Normal appearance. She is well-developed.  HENT:     Head: Normocephalic.     Right Ear: Tympanic membrane normal.     Left Ear: Tympanic membrane normal.     Nose: Nose normal.     Mouth/Throat:  Mouth: Mucous membranes are moist.  Eyes:     Pupils: Pupils are equal, round, and reactive to light.  Neck:     Vascular: No carotid bruit or JVD.  Cardiovascular:     Rate and Rhythm: Normal rate and regular rhythm.     Heart sounds: Normal heart sounds.  Pulmonary:     Effort: Pulmonary effort is normal. No respiratory distress.     Breath sounds: Normal breath sounds. No wheezing or rales.  Chest:     Chest wall: No tenderness.  Abdominal:     General: Bowel sounds are normal. There is no distension or abdominal bruit.     Palpations: Abdomen is soft. There is no hepatomegaly, splenomegaly, mass or pulsatile mass.     Tenderness: There is no abdominal tenderness. There is no right CVA tenderness or left CVA tenderness.  Musculoskeletal:        General: Normal range of motion.     Cervical back: Normal range of motion and neck supple.  Lymphadenopathy:     Cervical: No cervical adenopathy.  Skin:    General: Skin is warm and dry.  Neurological:     Mental Status: She is alert and oriented to person, place, and time.     Deep Tendon Reflexes: Reflexes are normal and symmetric.  Psychiatric:        Behavior: Behavior normal.        Thought Content: Thought content normal.        Judgment: Judgment normal.      BP (!) 149/76   Pulse 69   Temp (!) 96.8 F (36 C) (Temporal)   Ht 5' 3 (1.6 m)   Wt 151 lb (68.5 kg)   SpO2 92%   BMI 26.75 kg/m          Assessment & Plan:   Janet Randolph comes in today with chief complaint of annual physical  Diagnosis and  orders addressed:  1. Primary hypertension Low sodium diet Increased zestirl to 40mg  daily - lisinopril  (ZESTRIL ) 40 MG tablet; Take 1 tablet (40 mg total) by mouth daily.  Dispense: 90 tablet; Refill: 1  2. Mixed hyperlipidemia Low fat diet - rosuvastatin  (CRESTOR ) 10 MG tablet; Take 1 tablet (10 mg total) by mouth daily.  Dispense: 90 tablet; Refill: 1  3. Gastroesophageal reflux disease, unspecified whether esophagitis present Avoid spicy foods Do not eat 2 hours prior to bedtime - esomeprazole  (NEXIUM ) 40 MG capsule; Take 1 capsule (40 mg total) by mouth daily. Take 30 min before meal  Dispense: 90 capsule; Refill: 1  4. Recurrent major depressive disorder, in full remission (HCC) Stress management - venlafaxine  XR (EFFEXOR -XR) 75 MG 24 hr capsule; Take 2 capsules (150 mg total) by mouth daily with breakfast.  Dispense: 180 capsule; Refill: 1  5. GAD (generalized anxiety disorder)  - ALPRAZolam  (XANAX ) 0.5 MG tablet; Take 1 tablet (0.5 mg total) by mouth 2 (two) times daily as needed.  Dispense: 60 tablet; Refill: 5  6. acute cystitis Take medication as prescribe Cotton underwear Take shower not bath Cranberry juice, yogurt Force fluids AZO over the counter X2 days Culture pending RTO prn Bactrim   1 po BID for 10 days #20 no refills  Orders Placed This Encounter  Procedures   Urine Culture   Urinalysis, Routine w reflex microscopic   CBC with Differential/Platelet   CMP14+EGFR   Lipid panel   Thyroid  Panel With TSH   VITAMIN D  25 Hydroxy (Vit-D Deficiency, Fractures)  Labs pending Health Maintenance reviewed Diet and exercise encouraged  Follow up plan: 6 months   Mary-Margaret Gladis, FNP

## 2024-04-07 LAB — LIPID PANEL
Chol/HDL Ratio: 2.8 ratio (ref 0.0–4.4)
Cholesterol, Total: 135 mg/dL (ref 100–199)
HDL: 49 mg/dL (ref >39)
LDL Chol Calc (NIH): 69 mg/dL (ref 0–99)
Triglycerides: 92 mg/dL (ref 0–149)
VLDL Cholesterol Cal: 17 mg/dL (ref 5–40)

## 2024-04-07 LAB — CBC WITH DIFFERENTIAL/PLATELET
Basophils Absolute: 0 x10E3/uL (ref 0.0–0.2)
Basos: 1 %
EOS (ABSOLUTE): 0.1 x10E3/uL (ref 0.0–0.4)
Eos: 2 %
Hematocrit: 32.8 % — ABNORMAL LOW (ref 34.0–46.6)
Hemoglobin: 10.5 g/dL — ABNORMAL LOW (ref 11.1–15.9)
Immature Grans (Abs): 0.1 x10E3/uL (ref 0.0–0.1)
Immature Granulocytes: 1 %
Lymphocytes Absolute: 1.3 x10E3/uL (ref 0.7–3.1)
Lymphs: 15 %
MCH: 29.9 pg (ref 26.6–33.0)
MCHC: 32 g/dL (ref 31.5–35.7)
MCV: 93 fL (ref 79–97)
Monocytes Absolute: 0.7 x10E3/uL (ref 0.1–0.9)
Monocytes: 8 %
Neutrophils Absolute: 6.1 x10E3/uL (ref 1.4–7.0)
Neutrophils: 73 %
Platelets: 395 x10E3/uL (ref 150–450)
RBC: 3.51 x10E6/uL — ABNORMAL LOW (ref 3.77–5.28)
RDW: 13.2 % (ref 11.7–15.4)
WBC: 8.2 x10E3/uL (ref 3.4–10.8)

## 2024-04-07 LAB — THYROID PANEL WITH TSH
Free Thyroxine Index: 1 — AB (ref 1.2–4.9)
T3 Uptake Ratio: 10 — AB (ref 24–39)
T4, Total: 10 ug/dL (ref 4.5–12.0)
TSH: 1.96 u[IU]/mL (ref 0.450–4.500)

## 2024-04-07 LAB — CMP14+EGFR
ALT: 14 IU/L (ref 0–32)
AST: 16 IU/L (ref 0–40)
Albumin: 3.6 g/dL — ABNORMAL LOW (ref 3.9–4.9)
Alkaline Phosphatase: 105 IU/L (ref 44–121)
BUN/Creatinine Ratio: 33 — ABNORMAL HIGH (ref 12–28)
BUN: 21 mg/dL (ref 8–27)
Bilirubin Total: <0.2 mg/dL (ref 0.0–1.2)
CO2: 24 mmol/L (ref 20–29)
Calcium: 8.6 mg/dL — ABNORMAL LOW (ref 8.7–10.3)
Chloride: 102 mmol/L (ref 96–106)
Creatinine, Ser: 0.64 mg/dL (ref 0.57–1.00)
Globulin, Total: 2.4 g/dL (ref 1.5–4.5)
Glucose: 86 mg/dL (ref 70–99)
Potassium: 4.1 mmol/L (ref 3.5–5.2)
Sodium: 139 mmol/L (ref 134–144)
Total Protein: 6 g/dL (ref 6.0–8.5)
eGFR: 99 mL/min/1.73 (ref >59)

## 2024-04-07 LAB — VITAMIN D 25 HYDROXY (VIT D DEFICIENCY, FRACTURES): Vit D, 25-Hydroxy: 33 ng/mL (ref 30.0–100.0)

## 2024-04-08 ENCOUNTER — Ambulatory Visit: Payer: Self-pay | Admitting: Nurse Practitioner

## 2024-04-10 LAB — URINE CULTURE

## 2024-04-12 ENCOUNTER — Ambulatory Visit

## 2024-04-12 VITALS — BP 145/83 | HR 65 | Temp 97.7°F | Resp 20 | Ht 63.0 in | Wt 151.0 lb

## 2024-04-12 DIAGNOSIS — M818 Other osteoporosis without current pathological fracture: Secondary | ICD-10-CM

## 2024-04-12 DIAGNOSIS — Z8781 Personal history of (healed) traumatic fracture: Secondary | ICD-10-CM | POA: Diagnosis not present

## 2024-04-12 MED ORDER — CIPROFLOXACIN HCL 500 MG PO TABS: 500.0000 mg | ORAL_TABLET | Freq: Two times a day (BID) | ORAL | 0 refills | Status: DC

## 2024-04-12 MED ORDER — ROMOSOZUMAB-AQQG 105 MG/1.17ML ~~LOC~~ SOSY
210.0000 mg | PREFILLED_SYRINGE | Freq: Once | SUBCUTANEOUS | Status: AC
Start: 2024-04-12 — End: 2024-04-12
  Administered 2024-04-12: 210 mg via SUBCUTANEOUS
  Filled 2024-04-12: qty 2.34

## 2024-04-12 NOTE — Progress Notes (Signed)
 Diagnosis: Osteoporosis  Provider:  Mannam, Praveen MD  Procedure: Injection  Evenity  (Romosozumab -aqqg), Dose: 210 mg, Site: subcutaneous, Number of injections: 2  Injection Site(s): Left lower quad. abdomen and Right lower quad. abdomne  Post Care: left and right lower abdominal injections  Discharge: Condition: Good, Destination: Home . AVS Declined  Performed by:  Maximiano JONELLE Pouch, LPN

## 2024-05-04 ENCOUNTER — Other Ambulatory Visit: Payer: Self-pay | Admitting: Nurse Practitioner

## 2024-05-04 DIAGNOSIS — K219 Gastro-esophageal reflux disease without esophagitis: Secondary | ICD-10-CM

## 2024-05-12 ENCOUNTER — Ambulatory Visit

## 2024-05-12 VITALS — BP 173/96 | HR 67 | Temp 97.5°F | Resp 20 | Ht 63.0 in | Wt 151.2 lb

## 2024-05-12 DIAGNOSIS — Z8781 Personal history of (healed) traumatic fracture: Secondary | ICD-10-CM

## 2024-05-12 DIAGNOSIS — M818 Other osteoporosis without current pathological fracture: Secondary | ICD-10-CM | POA: Diagnosis not present

## 2024-05-12 MED ORDER — ROMOSOZUMAB-AQQG 105 MG/1.17ML ~~LOC~~ SOSY
210.0000 mg | PREFILLED_SYRINGE | Freq: Once | SUBCUTANEOUS | Status: AC
  Administered 2024-05-12: 210 mg via SUBCUTANEOUS

## 2024-05-12 NOTE — Progress Notes (Signed)
 Diagnosis: Osteoporosis  Provider:  Mannam, Praveen MD  Procedure: Injection  Evenity  (Romosozumab -aqqg), Dose: 210 mg, Site: subcutaneous, Number of injections: 2  Injection Site(s): Left lower quad. abdomen and Right lower quad. abdomne  Post Care: left lower quad and right lower quad abdominal injection  Discharge: Condition: Good, Destination: Home . AVS Declined  Performed by:  Maximiano JONELLE Pouch, LPN

## 2024-06-14 ENCOUNTER — Ambulatory Visit

## 2024-06-14 VITALS — BP 157/82 | HR 75 | Temp 97.8°F | Resp 18 | Ht 63.0 in | Wt 153.0 lb

## 2024-06-14 DIAGNOSIS — M818 Other osteoporosis without current pathological fracture: Secondary | ICD-10-CM | POA: Diagnosis not present

## 2024-06-14 DIAGNOSIS — Z8781 Personal history of (healed) traumatic fracture: Secondary | ICD-10-CM | POA: Diagnosis not present

## 2024-06-14 MED ORDER — ROMOSOZUMAB-AQQG 105 MG/1.17ML ~~LOC~~ SOSY
210.0000 mg | PREFILLED_SYRINGE | Freq: Once | SUBCUTANEOUS | Status: AC
  Administered 2024-06-14: 210 mg via SUBCUTANEOUS

## 2024-06-14 NOTE — Progress Notes (Signed)
 Diagnosis: Osteoporosis  Provider:  Mannam, Praveen MD  Procedure: Injection  Evenity  (Romosozumab -aqqg), Dose: 210 mg, Site: subcutaneous, Number of injections: 2  Injection Site(s): Left upper quad. abdomen and Right upper quad. abdomen  Post Care: Patient declined observation  Discharge: Condition: Good, Destination: Home . AVS Declined  Performed by:  Lendel Quant, RN

## 2024-07-01 ENCOUNTER — Emergency Department (HOSPITAL_BASED_OUTPATIENT_CLINIC_OR_DEPARTMENT_OTHER)

## 2024-07-01 ENCOUNTER — Observation Stay (HOSPITAL_BASED_OUTPATIENT_CLINIC_OR_DEPARTMENT_OTHER)
Admission: EM | Admit: 2024-07-01 | Discharge: 2024-07-03 | Disposition: A | Discharge status: Home / Self Care | Attending: Internal Medicine | Admitting: Internal Medicine

## 2024-07-01 ENCOUNTER — Other Ambulatory Visit: Payer: Self-pay

## 2024-07-01 DIAGNOSIS — R29810 Facial weakness: Secondary | ICD-10-CM | POA: Diagnosis present

## 2024-07-01 DIAGNOSIS — G43809 Other migraine, not intractable, without status migrainosus: Principal | ICD-10-CM

## 2024-07-01 DIAGNOSIS — I6389 Other cerebral infarction: Principal | ICD-10-CM | POA: Insufficient documentation

## 2024-07-01 DIAGNOSIS — Z91040 Latex allergy status: Secondary | ICD-10-CM | POA: Diagnosis not present

## 2024-07-01 DIAGNOSIS — F411 Generalized anxiety disorder: Secondary | ICD-10-CM

## 2024-07-01 DIAGNOSIS — Z7982 Long term (current) use of aspirin: Secondary | ICD-10-CM | POA: Diagnosis not present

## 2024-07-01 DIAGNOSIS — E785 Hyperlipidemia, unspecified: Secondary | ICD-10-CM | POA: Diagnosis not present

## 2024-07-01 DIAGNOSIS — I10 Essential (primary) hypertension: Secondary | ICD-10-CM | POA: Diagnosis not present

## 2024-07-01 DIAGNOSIS — F10920 Alcohol use, unspecified with intoxication, uncomplicated: Secondary | ICD-10-CM | POA: Insufficient documentation

## 2024-07-01 DIAGNOSIS — N179 Acute kidney failure, unspecified: Secondary | ICD-10-CM | POA: Diagnosis not present

## 2024-07-01 DIAGNOSIS — G43909 Migraine, unspecified, not intractable, without status migrainosus: Secondary | ICD-10-CM | POA: Insufficient documentation

## 2024-07-01 DIAGNOSIS — Z79899 Other long term (current) drug therapy: Secondary | ICD-10-CM | POA: Diagnosis not present

## 2024-07-01 DIAGNOSIS — Z8673 Personal history of transient ischemic attack (TIA), and cerebral infarction without residual deficits: Secondary | ICD-10-CM | POA: Diagnosis present

## 2024-07-01 DIAGNOSIS — I639 Cerebral infarction, unspecified: Secondary | ICD-10-CM | POA: Diagnosis present

## 2024-07-01 LAB — BASIC METABOLIC PANEL WITH GFR
Anion gap: 10 (ref 5–15)
BUN: 21 mg/dL (ref 8–23)
CO2: 24 mmol/L (ref 22–32)
Calcium: 9.4 mg/dL (ref 8.9–10.3)
Chloride: 104 mmol/L (ref 98–111)
Creatinine, Ser: 1.11 mg/dL — ABNORMAL HIGH (ref 0.44–1.00)
GFR, Estimated: 55 mL/min — ABNORMAL LOW (ref >60)
Glucose, Bld: 105 mg/dL — ABNORMAL HIGH (ref 70–99)
Potassium: 3.8 mmol/L (ref 3.5–5.1)
Sodium: 139 mmol/L (ref 135–145)

## 2024-07-01 LAB — CBC WITH DIFFERENTIAL/PLATELET
Abs Immature Granulocytes: 0.02 K/uL (ref 0.00–0.07)
Basophils Absolute: 0 K/uL (ref 0.0–0.1)
Basophils Relative: 1 %
Eosinophils Absolute: 0.2 K/uL (ref 0.0–0.5)
Eosinophils Relative: 2 %
HCT: 34.2 % — ABNORMAL LOW (ref 36.0–46.0)
Hemoglobin: 10.6 g/dL — ABNORMAL LOW (ref 12.0–15.0)
Immature Granulocytes: 0 %
Lymphocytes Relative: 26 %
Lymphs Abs: 2.1 K/uL (ref 0.7–4.0)
MCH: 29.8 pg (ref 26.0–34.0)
MCHC: 31 g/dL (ref 30.0–36.0)
MCV: 96.1 fL (ref 80.0–100.0)
Monocytes Absolute: 0.6 K/uL (ref 0.1–1.0)
Monocytes Relative: 7 %
Neutro Abs: 5 K/uL (ref 1.7–7.7)
Neutrophils Relative %: 64 %
Platelets: 375 K/uL (ref 150–400)
RBC: 3.56 MIL/uL — ABNORMAL LOW (ref 3.87–5.11)
RDW: 13.9 % (ref 11.5–15.5)
WBC: 7.9 K/uL (ref 4.0–10.5)
nRBC: 0 % (ref 0.0–0.2)

## 2024-07-01 MED ORDER — PROCHLORPERAZINE EDISYLATE 10 MG/2ML IJ SOLN
5.0000 mg | Freq: Once | INTRAMUSCULAR | Status: AC
  Administered 2024-07-01: 5 mg via INTRAVENOUS
  Filled 2024-07-01: qty 2

## 2024-07-01 MED ORDER — DIPHENHYDRAMINE HCL 50 MG/ML IJ SOLN
12.5000 mg | Freq: Once | INTRAMUSCULAR | Status: AC
  Administered 2024-07-01: 12.5 mg via INTRAVENOUS
  Filled 2024-07-01: qty 1

## 2024-07-01 MED ORDER — PROCHLORPERAZINE EDISYLATE 10 MG/2ML IJ SOLN: 10.0000 mg | Freq: Once | INTRAMUSCULAR | Status: DC

## 2024-07-01 MED ORDER — IOHEXOL 350 MG/ML SOLN
75.0000 mL | Freq: Once | INTRAVENOUS | Status: AC | PRN
  Administered 2024-07-01: 75 mL via INTRAVENOUS

## 2024-07-01 NOTE — ED Triage Notes (Signed)
 Woke up with headache. Son concerned for asymmetrical smile when face is relaxed. Denies trauma falls. Patient unsure if her face looks different. EDP [place] at triage for eval. No other deficits appreciated.

## 2024-07-01 NOTE — ED Provider Notes (Signed)
 [name] Provider Note   CSN: [id number] Arrival date & time: 07/01/24  2022     Patient presents with: Headache   Janet Randolph is a 63 y.o. female.   Patient here with headache and maybe some droopiness to the left upper lip.  She has history of migraines.  She has daily headaches.  Somewhat worse today but actually feeling better now.  She took her usual migraine meds.  She has a history of hypertension high cholesterol.  No aneurysm history.  This is not the worst headache of her life.  She usually gets auras and blurred vision with her headaches but she has never had any weakness with her headaches.  She does not notice that her face feels different but her son thought that her upper lip looked a little bit droopy.  However when she smiles face is symmetric.  She denies any weakness numbness tingling.  No vision loss.  No neck pain.  No fever no chills.  The history is provided by the patient.       Prior to Admission medications   Medication Sig Start Date End Date Taking? Authorizing Provider  acetaminophen  (TYLENOL ) 500 MG tablet Take 500 mg by mouth every 6 (six) hours as needed.    [provider]  ALPRAZolam  (XANAX ) 0.5 MG tablet Take 1 tablet (0.5 mg total) by mouth 2 (two) times daily as needed. 04/06/24   Gladis Mary-Margaret, FNP  CALCIUM  PO Take by mouth daily.    [provider]  ciprofloxacin  (CIPRO ) 500 MG tablet Take 1 tablet (500 mg total) by mouth 2 (two) times daily. 04/12/24   Gladis, Mary-Margaret, FNP  Cyanocobalamin (VITAMIN B-12 PO) Take by mouth daily.    [provider]  desonide  (DESOWEN ) 0.05 % cream Apply topically 2 (two) times daily. Patient taking differently: Apply 1 application  topically 2 (two) times daily as needed (rash). 06/23/17   Gladis, Mary-Margaret, FNP  esomeprazole  (NEXIUM ) 40 MG capsule TAKE 1 CAPSULE (40 MG TOTAL) BY MOUTH DAILY. TAKE 30 MIN BEFORE MEAL  05/04/24   Gladis, Mary-Margaret, FNP  lisinopril  (ZESTRIL ) 40 MG tablet Take 1 tablet (40 mg total) by mouth daily. 04/06/24   Gladis, Mary-Margaret, FNP  loperamide (IMODIUM) 2 MG capsule Take 4 mg by mouth daily as needed for diarrhea or loose stools.    [provider]  rosuvastatin  (CRESTOR ) 10 MG tablet Take 1 tablet (10 mg total) by mouth daily. 04/06/24   Gladis Mary-Margaret, FNP  sulfamethoxazole -trimethoprim  (BACTRIM  DS) 800-160 MG tablet Take 1 tablet by mouth 2 (two) times daily. 04/06/24   Gladis Mustard, FNP  Teriparatide  620 MCG/2.48ML SOPN     [provider]  venlafaxine  XR (EFFEXOR -XR) 75 MG 24 hr capsule Take 2 capsules (150 mg total) by mouth daily with breakfast. 04/06/24   Gladis, Mary-Margaret, FNP  VITAMIN D  PO Take 1,000 Units by mouth daily.    [provider]    Allergies: Latex and Penicillins    Review of Systems  Updated Vital Signs BP (!) 160/90   Pulse 80   Temp 97.8 F (36.6 C) (Oral)   Resp 16   SpO2 94%   Physical Exam Vitals and nursing note reviewed.  Constitutional:      General: She is not in acute distress.    Appearance: She is well-developed. She is not ill-appearing.  HENT:     Head: Normocephalic and atraumatic.     Mouth/Throat:  Mouth: Mucous membranes are moist.     Pharynx: Oropharynx is clear.  Eyes:     Extraocular Movements: Extraocular movements intact.     Right eye: Normal extraocular motion and no nystagmus.     Left eye: Normal extraocular motion and no nystagmus.     Conjunctiva/sclera: Conjunctivae normal.     Pupils: Pupils are equal, round, and reactive to light.  Cardiovascular:     Rate and Rhythm: Normal rate and regular rhythm.     Heart sounds: Normal heart sounds. No murmur heard. Pulmonary:     Effort: Pulmonary effort is normal. No respiratory distress.     Breath sounds: Normal breath sounds.  Abdominal:     Palpations: Abdomen is soft.     Tenderness: There is no  abdominal tenderness.  Musculoskeletal:        General: No swelling.     Cervical back: Normal range of motion and neck supple.  Skin:    General: Skin is warm and dry.     Capillary Refill: Capillary refill takes less than 2 seconds.  Neurological:     Mental Status: She is alert.     Comments: 5+ out of 5 strength throughout, normal sensation, no drift, normal speech, normal visual fields, question maybe a very subtle droopiness to the left upper lip but when she smiles she is smile symmetric, she puff out her cheeks it symmetric, cranial nerves appear to be intact otherwise  Psychiatric:        Mood and Affect: Mood normal.     (all labs ordered are listed, but only abnormal results are displayed) Labs Reviewed  CBC WITH DIFFERENTIAL/PLATELET - Abnormal; Notable for the following components:      Result Value   RBC 3.56 (*)    Hemoglobin 10.6 (*)    HCT 34.2 (*)    All other components within normal limits  BASIC METABOLIC PANEL WITH GFR - Abnormal; Notable for the following components:   Glucose, Bld 105 (*)    Creatinine, Ser 1.11 (*)    GFR, Estimated 55 (*)    All other components within normal limits    EKG: EKG Interpretation Date/Time:  Thursday July 01 2024 21:14:51 EDT Ventricular Rate:  77 PR Interval:  160 QRS Duration:  144 QT Interval:  396 QTC Calculation: 448 R Axis:   102  Text Interpretation: Normal sinus rhythm Right bundle branch block Abnormal ECG No previous ECGs available Confirmed by Ruthe Cornet [phone]) on 07/01/2024 9:16:11 PM  Radiology: CT ANGIO HEAD NECK W WO CM Result Date: 07/01/2024 EXAM: CTA HEAD AND NECK WITHOUT AND WITH 07/01/2024 10:38:26 PM TECHNIQUE: CTA of the head and neck was performed without and with the administration of intravenous contrast. Multiplanar 2D and/or 3D reformatted images are provided for review. Automated exposure control, iterative reconstruction, and/or weight based adjustment of the mA/kV was utilized to  reduce the radiation dose to as low as reasonably achievable. Stenosis of the internal carotid arteries measured using NASCET criteria. COMPARISON: None available CLINICAL HISTORY: Neuro deficit, acute, stroke suspected. Son concerned for asymmetrical smile when face is relaxed. FINDINGS: AORTIC ARCH AND ARCH VESSELS: No dissection or arterial injury. No significant stenosis of the brachiocephalic or subclavian arteries. CERVICAL CAROTID ARTERIES: No dissection, arterial injury, or hemodynamically significant stenosis by NASCET criteria. Mild irregularity of the ICAs bilaterally at the skull base. CERVICAL VERTEBRAL ARTERIES: No dissection, arterial injury, or significant stenosis. LUNGS AND MEDIASTINUM: Unremarkable. SOFT TISSUES: Previously biopsied thyroid  nodules that  were further characterized on ultrasound from 05/14/23. BONES: No acute abnormality. ANTERIOR CIRCULATION: No significant stenosis of the internal carotid arteries. No significant stenosis of the anterior cerebral arteries. No significant stenosis of the middle cerebral arteries. No aneurysm. POSTERIOR CIRCULATION: No significant stenosis of the posterior cerebral arteries. No significant stenosis of the basilar artery. No significant stenosis of the vertebral arteries. No aneurysm. OTHER: No dural venous sinus thrombosis on this non-dedicated study. IMPRESSION: 1. No large vessel occlusion or proximal hemodynamically significant stenosis. 2. Mild irregularity of the ICAs bilaterally at the skull base, which can be seen with fibromuscular dysplasia. Electronically signed by: Gilmore Molt MD 07/01/2024 11:00 PM EDT RP Workstation: [id number]     Procedures   Medications Ordered in the ED  diphenhydrAMINE  (BENADRYL ) injection 12.5 mg (12.5 mg Intravenous Given 07/01/24 2142)  prochlorperazine (COMPAZINE) injection 5 mg (5 mg Intravenous Given 07/01/24 2143)  iohexol (OMNIPAQUE) 350 MG/ML injection 75 mL (75 mLs Intravenous Contrast Given  07/01/24 2230)                                    Medical Decision Making Amount and/or Complexity of Data Reviewed Labs: ordered. Radiology: ordered.  Risk Prescription drug management.   JODECI RINI is here with headache and may be subtle left upper lip droop at rest but gets better when she smiles.  Do not notice any major stroke symptoms on exam.  I think there might be a very subtle droopiness to her left upper lip compared to the right but it actually goes away when she smiles.  She does not have any weakness numbness tingling otherwise.  She has history of migraines hypertension high cholesterol.  She had a worsening normal migraine today but its gotten better with her medications.  She is not describing this as the worst headache of her life.  This did not happen suddenly.  She denies any nausea.  Headache is about a 5 out of 10 now which is fairly good for her.  She is a little bit hypertensive but otherwise normal vitals.  She is very well-appearing.  May be complex migraine history with some vision issues with headaches in the past.  Overall we will get a CTA of the head and neck check basic labs give headache cocktail Compazine Benadryl  and reevaluate.  Last known normal was maybe yesterday.  She did not notice any symptoms today but her son noticed the lip droopiness prior to coming here.  Overall lab work shows no significant leukocytosis anemia or electrolyte abnormality.  CTA of the head and neck is unremarkable.  I talked with Dr. Michaela with neurology.  Overall I do suspect likely complex migraine.  But given slight facial droop of the upper lip on the left will send for MRV and MRI brain.  Patient to go POV.  Feeling better following cocktail.  Dr. Lorette Dr. Polina made aware.  She is stable.  This chart was dictated using voice recognition software.  Despite best efforts to proofread,  errors can occur which can change the documentation meaning.      Final  diagnoses:  Other migraine without status migrainosus, not intractable    ED Discharge Orders     None          Ruthe Cornet, DO 07/01/24 2327

## 2024-07-02 ENCOUNTER — Encounter (HOSPITAL_COMMUNITY): Payer: Self-pay | Admitting: Internal Medicine

## 2024-07-02 ENCOUNTER — Emergency Department (HOSPITAL_COMMUNITY)

## 2024-07-02 ENCOUNTER — Other Ambulatory Visit: Payer: Self-pay | Admitting: Physician Assistant

## 2024-07-02 ENCOUNTER — Observation Stay (HOSPITAL_COMMUNITY)

## 2024-07-02 DIAGNOSIS — E785 Hyperlipidemia, unspecified: Secondary | ICD-10-CM

## 2024-07-02 DIAGNOSIS — I10 Essential (primary) hypertension: Secondary | ICD-10-CM

## 2024-07-02 DIAGNOSIS — I639 Cerebral infarction, unspecified: Secondary | ICD-10-CM

## 2024-07-02 DIAGNOSIS — Z8673 Personal history of transient ischemic attack (TIA), and cerebral infarction without residual deficits: Secondary | ICD-10-CM | POA: Diagnosis present

## 2024-07-02 DIAGNOSIS — R29701 NIHSS score 1: Secondary | ICD-10-CM

## 2024-07-02 DIAGNOSIS — I6389 Other cerebral infarction: Secondary | ICD-10-CM

## 2024-07-02 DIAGNOSIS — Z87891 Personal history of nicotine dependence: Secondary | ICD-10-CM

## 2024-07-02 LAB — ECHOCARDIOGRAM COMPLETE
AR max vel: 2.35 cm2
AV Peak grad: 18.5 mmHg
Ao pk vel: 2.15 m/s
Area-P 1/2: 3.85 cm2
S' Lateral: 3.1 cm
Weight: 2416.24 [oz_av]

## 2024-07-02 LAB — CBC
HCT: 33.1 % — ABNORMAL LOW (ref 36.0–46.0)
Hemoglobin: 10.2 g/dL — ABNORMAL LOW (ref 12.0–15.0)
MCH: 29.9 pg (ref 26.0–34.0)
MCHC: 30.8 g/dL (ref 30.0–36.0)
MCV: 97.1 fL (ref 80.0–100.0)
Platelets: 331 K/uL (ref 150–400)
RBC: 3.41 MIL/uL — ABNORMAL LOW (ref 3.87–5.11)
RDW: 14.1 % (ref 11.5–15.5)
WBC: 5.8 K/uL (ref 4.0–10.5)
nRBC: 0 % (ref 0.0–0.2)

## 2024-07-02 LAB — LIPID PANEL
Cholesterol: 140 mg/dL (ref 0–200)
HDL: 57 mg/dL (ref >40)
LDL Cholesterol: 71 mg/dL (ref 0–99)
Total CHOL/HDL Ratio: 2.5 ratio
Triglycerides: 61 mg/dL (ref <150)
VLDL: 12 mg/dL (ref 0–40)

## 2024-07-02 LAB — HEMOGLOBIN A1C
Hgb A1c MFr Bld: 5.7 % — ABNORMAL HIGH (ref 4.8–5.6)
Mean Plasma Glucose: 116.89 mg/dL

## 2024-07-02 LAB — HIV ANTIBODY (ROUTINE TESTING W REFLEX): HIV Screen 4th Generation wRfx: NONREACTIVE

## 2024-07-02 LAB — CREATININE, SERUM
Creatinine, Ser: 0.82 mg/dL (ref 0.44–1.00)
GFR, Estimated: >60 mL/min (ref >60)

## 2024-07-02 MED ORDER — ENOXAPARIN SODIUM 40 MG/0.4ML IJ SOSY
40.0000 mg | PREFILLED_SYRINGE | INTRAMUSCULAR | Status: DC
  Administered 2024-07-02 – 2024-07-03 (×2): 40 mg via SUBCUTANEOUS
  Filled 2024-07-02 (×2): qty 0.4

## 2024-07-02 MED ORDER — ATORVASTATIN CALCIUM 80 MG PO TABS
80.0000 mg | ORAL_TABLET | Freq: Every day | ORAL | Status: DC
  Administered 2024-07-02 – 2024-07-03 (×2): 80 mg via ORAL
  Filled 2024-07-02 (×2): qty 1

## 2024-07-02 MED ORDER — BUTALBITAL-APAP-CAFFEINE 50-325-40 MG PO TABS
1.0000 | ORAL_TABLET | ORAL | Status: DC | PRN
  Administered 2024-07-02 – 2024-07-03 (×4): 1 via ORAL
  Filled 2024-07-02 (×4): qty 1

## 2024-07-02 MED ORDER — GADOBUTROL 1 MMOL/ML IV SOLN
7.0000 mL | Freq: Once | INTRAVENOUS | Status: AC | PRN
  Administered 2024-07-02: 7 mL via INTRAVENOUS

## 2024-07-02 MED ORDER — ASPIRIN 325 MG PO TABS
325.0000 mg | ORAL_TABLET | ORAL | Status: AC
  Administered 2024-07-02: 325 mg via ORAL
  Filled 2024-07-02: qty 1

## 2024-07-02 MED ORDER — CLOPIDOGREL BISULFATE 75 MG PO TABS
75.0000 mg | ORAL_TABLET | Freq: Every day | ORAL | Status: DC
  Administered 2024-07-03: 75 mg via ORAL
  Filled 2024-07-02: qty 1

## 2024-07-02 MED ORDER — CLOPIDOGREL BISULFATE 300 MG PO TABS
300.0000 mg | ORAL_TABLET | ORAL | Status: AC
  Administered 2024-07-02: 300 mg via ORAL
  Filled 2024-07-02: qty 1

## 2024-07-02 MED ORDER — VENLAFAXINE HCL ER 75 MG PO CP24
75.0000 mg | ORAL_CAPSULE | Freq: Every day | ORAL | Status: DC
  Administered 2024-07-03: 75 mg via ORAL
  Filled 2024-07-02: qty 1

## 2024-07-02 MED ORDER — STROKE: EARLY STAGES OF RECOVERY BOOK
Freq: Once | Status: AC
  Filled 2024-07-02: qty 1

## 2024-07-02 MED ORDER — OXYCODONE HCL 5 MG PO TABS
5.0000 mg | ORAL_TABLET | Freq: Four times a day (QID) | ORAL | Status: DC | PRN
  Filled 2024-07-02: qty 1

## 2024-07-02 MED ORDER — LACTATED RINGERS IV SOLN: INTRAVENOUS | Status: AC

## 2024-07-02 MED ORDER — ASPIRIN 81 MG PO TBEC
81.0000 mg | DELAYED_RELEASE_TABLET | Freq: Every day | ORAL | Status: DC
  Administered 2024-07-03: 81 mg via ORAL
  Filled 2024-07-02: qty 1

## 2024-07-02 MED ORDER — ALPRAZOLAM 0.25 MG PO TABS
0.5000 mg | ORAL_TABLET | Freq: Two times a day (BID) | ORAL | Status: DC | PRN
  Administered 2024-07-02 – 2024-07-03 (×2): 0.5 mg via ORAL
  Filled 2024-07-02 (×2): qty 2

## 2024-07-02 MED ORDER — ACETAMINOPHEN 325 MG PO TABS
650.0000 mg | ORAL_TABLET | Freq: Four times a day (QID) | ORAL | Status: DC | PRN
  Administered 2024-07-02: 650 mg via ORAL
  Filled 2024-07-02: qty 2

## 2024-07-02 NOTE — Progress Notes (Signed)
 PROGRESS NOTE    Janet Randolph  FMW:[id number] DOB: 09-Mar-1961 DOA: 07/01/2024 PCP: Gladis Mustard, FNP  Outpatient Specialists:     Brief Narrative:  Patient is a 63 year old female past medical history significant for hypertension, hyperlipidemia, spinal stenosis, vitamin D  deficiency, depression and migraines.  Patient was admitted with left lower facial droop and headache.  MRI brain with and without contrast revealed acute infarcts in the right frontal white matter and right insula.  Neurology is directing care.  Patient is on aspirin and Plavix.  Facial droop has resolved.   Assessment & Plan:   Principal Problem:   Acute CVA (cerebrovascular accident) (HCC)   Acute CVA: -Patient is currently on aspirin 81 mg p.o. once daily on Plavix 75 mg p.o. once daily. -Plavix will be for 3 weeks. -CT angio head and neck, no LVO. -Brain MRI with and without contrast revealed acute infarct in the right frontal white matter and right insula.  -Neurology team is directing further workup. -Follow transthoracic echo, fasting lipid panel -Frequent neurochecks -Permissive hypertension -PT/OT/speech therapist evaluation -High intensity statin. -Monitor on telemetry.   Hypertension Ongoing permissive hypertension. Treat SBP greater than 220 or DBP greater than 120. Closely monitor vital signs.   Hyperlipidemia High intensity statin Goal LDL less than 70. Continue Lipitor 80 mg p.o. once daily.   AKI, likely prerenal from dehydration Baseline creatinine 0.6 with GFR greater than 60 Presented with creatinine 1.11 with GFR 55 IV fluid hydration LR at 75 cc/h x 1 day Serum creatinine is down to 0.82 with eGFR of greater than 60 mL/min per 1.73 m. AKI has resolved. Goody powder may have contributed to the AKI.   Migraines Prior to admission was taking Goody powders Tylenol  for now.  DVT prophylaxis:  Code Status: Patient is a full code. Family Communication:   Disposition Plan:    Consultants:  Neurology.  Procedures:  None.  Antimicrobials:  None.   Subjective: Continues to report migraine headache.  Objective: Vitals:   07/02/24 0300 07/02/24 0536 07/02/24 0915 07/02/24 0934  BP:  (!) 161/80 (!) 153/93   Pulse:  69 74   Resp:  16 16   Temp:  97.7 F (36.5 C)  97.6 F (36.4 C)  TempSrc:    Oral  SpO2:  95% 95%   Weight: 68.5 kg      No intake or output data in the 24 hours ending 07/02/24 1137 Filed Weights   07/02/24 0300  Weight: 68.5 kg    Examination:  General exam: Appears calm and comfortable  Respiratory system: Clear to auscultation. Respiratory effort normal. Cardiovascular system: S1 & S2 heard Gastrointestinal system: Abdomen is soft and nontender.  Central nervous system: Alert and oriented.  Moves all extremities.  Data Reviewed: I have personally reviewed following labs and imaging studies  CBC: Recent Labs  Lab 07/01/24 2135 07/02/24 0610  WBC 7.9 5.8  NEUTROABS 5.0  --   HGB 10.6* 10.2*  HCT 34.2* 33.1*  MCV 96.1 97.1  PLT 375 331   Basic Metabolic Panel: Recent Labs  Lab 07/01/24 2135 07/02/24 0610  NA 139  --   K 3.8  --   CL 104  --   CO2 24  --   GLUCOSE 105*  --   BUN 21  --   CREATININE 1.11* 0.82  CALCIUM  9.4  --    GFR: Estimated Creatinine Clearance: 63.5 mL/min (by C-G formula based on SCr of 0.82 mg/dL). Liver Function Tests: No results for  input(s): AST, ALT, ALKPHOS, BILITOT, PROT, ALBUMIN in the last 168 hours. No results for input(s): LIPASE, AMYLASE in the last 168 hours. No results for input(s): AMMONIA in the last 168 hours. Coagulation Profile: No results for input(s): INR, PROTIME in the last 168 hours. Cardiac Enzymes: No results for input(s): CKTOTAL, CKMB, CKMBINDEX, TROPONINI in the last 168 hours. BNP (last 3 results) No results for input(s): PROBNP in the last 8760 hours. HbA1C: Recent Labs    07/02/24 0610   HGBA1C 5.7*   CBG: No results for input(s): GLUCAP in the last 168 hours. Lipid Profile: Recent Labs    07/02/24 0610  CHOL 140  HDL 57  LDLCALC 71  TRIG 61  CHOLHDL 2.5   Thyroid  Function Tests: No results for input(s): TSH, T4TOTAL, FREET4, T3FREE, THYROIDAB in the last 72 hours. Anemia Panel: No results for input(s): VITAMINB12, FOLATE, FERRITIN, TIBC, IRON, RETICCTPCT in the last 72 hours. Urine analysis:    Component Value Date/Time   APPEARANCEUR Cloudy (A) 04/06/2024 1026   GLUCOSEU Negative 04/06/2024 1026   BILIRUBINUR Negative 04/06/2024 1026   PROTEINUR 2+ (A) 04/06/2024 1026   UROBILINOGEN negative 07/19/2014 1441   NITRITE Positive (A) 04/06/2024 1026   LEUKOCYTESUR 3+ (A) 04/06/2024 1026   Sepsis Labs: @LABRCNTIP (procalcitonin:4,lacticidven:4)  )No results found for this or any previous visit (from the past 240 hours).       Radiology Studies: MR Brain W and Wo Contrast Result Date: 07/02/2024 EXAM: MRI BRAIN WITH AND WITHOUT CONTRAST 07/02/2024 01:11:00 AM TECHNIQUE: Multiplanar multisequence MRI of the head/brain was performed with and without the administration of intravenous contrast. COMPARISON: CTA head/neck as of 07/01/2024. CLINICAL HISTORY: Headache, increasing frequency or severity; Neuro deficit, acute, stroke suspected. FINDINGS: BRAIN AND VENTRICLES: Acute infarcts in the right frontal white matter and right insula. Moderate T2 hyperintensities in the white matter, compatible with chronic microvascular ischemic change. Remote left cerebelalr infarct. No acute intracranial hemorrhage. No mass effect or midline shift. No hydrocephalus. Normal flow voids. No mass or abnormal enhancement. ORBITS: No acute abnormality. SINUSES: No acute abnormality. BONES AND SOFT TISSUES: Normal bone marrow signal and enhancement. No acute soft tissue abnormality. IMPRESSION: 1. Acute infarcts in the right frontal white matter and right insula.  Electronically signed by: Gilmore Molt MD 07/02/2024 01:28 AM EDT RP Workstation: [id number]   MR MRV HEAD W WO CONTRAST Result Date: 07/02/2024 EXAM: MRV BRAIN 07/02/2024 01:08:00 AM TECHNIQUE: Multiplanar multisequence MRV of the head was performed without and with the administration of 7mL gadobutrol (GADAVIST) 1 MMOL/ML injection 7 mL GADOBUTROL 1 MMOL/ML IV SOLN intravenous contrast. Multiplanar 2D and 3D reformatted images are provided for review. COMPARISON: None available. CLINICAL HISTORY: Dural venous sinus thrombosis suspected. FINDINGS: No focal stenosis or thrombus is seen of the major dural venous sinuses. IMPRESSION: 1. No evidence of dural venous sinus thrombosis. Electronically signed by: Gilmore Molt MD 07/02/2024 01:24 AM EDT RP Workstation: [id number]   CT ANGIO HEAD NECK W WO CM Result Date: 07/01/2024 EXAM: CTA HEAD AND NECK WITHOUT AND WITH 07/01/2024 10:38:26 PM TECHNIQUE: CTA of the head and neck was performed without and with the administration of intravenous contrast. Multiplanar 2D and/or 3D reformatted images are provided for review. Automated exposure control, iterative reconstruction, and/or weight based adjustment of the mA/kV was utilized to reduce the radiation dose to as low as reasonably achievable. Stenosis of the internal carotid arteries measured using NASCET criteria. COMPARISON: None available CLINICAL HISTORY: Neuro deficit, acute, stroke suspected. Son concerned for asymmetrical  smile when face is relaxed. FINDINGS: AORTIC ARCH AND ARCH VESSELS: No dissection or arterial injury. No significant stenosis of the brachiocephalic or subclavian arteries. CERVICAL CAROTID ARTERIES: No dissection, arterial injury, or hemodynamically significant stenosis by NASCET criteria. Mild irregularity of the ICAs bilaterally at the skull base. CERVICAL VERTEBRAL ARTERIES: No dissection, arterial injury, or significant stenosis. LUNGS AND MEDIASTINUM: Unremarkable. SOFT TISSUES:  Previously biopsied thyroid  nodules that were further characterized on ultrasound from 05/14/23. BONES: No acute abnormality. ANTERIOR CIRCULATION: No significant stenosis of the internal carotid arteries. No significant stenosis of the anterior cerebral arteries. No significant stenosis of the middle cerebral arteries. No aneurysm. POSTERIOR CIRCULATION: No significant stenosis of the posterior cerebral arteries. No significant stenosis of the basilar artery. No significant stenosis of the vertebral arteries. No aneurysm. OTHER: No dural venous sinus thrombosis on this non-dedicated study. IMPRESSION: 1. No large vessel occlusion or proximal hemodynamically significant stenosis. 2. Mild irregularity of the ICAs bilaterally at the skull base, which can be seen with fibromuscular dysplasia. Electronically signed by: Gilmore Molt MD 07/01/2024 11:00 PM EDT RP Workstation: [id number]        Scheduled Meds:  [START ON 07/03/2024]  stroke: early stages of recovery book   Does not apply Once   [START ON 07/03/2024] aspirin EC  81 mg Oral Daily   atorvastatin   80 mg Oral Daily   [START ON 07/03/2024] clopidogrel  75 mg Oral Daily   enoxaparin (LOVENOX) injection  40 mg Subcutaneous Q24H   [START ON 07/03/2024] venlafaxine  XR  75 mg Oral Q breakfast   Continuous Infusions:  lactated ringers 75 mL/hr at 07/02/24 0535     LOS: 0 days    Time spent: 63 Minutes    Leatrice Chapel, MD  [facility] Pager #: [phone] 7PM-7AM contact night coverage as above

## 2024-07-02 NOTE — Progress Notes (Signed)
 Echocardiogram 2D Echocardiogram has been performed.  Janet Randolph 07/02/2024, 1:31 PM

## 2024-07-02 NOTE — Progress Notes (Addendum)
 STROKE TEAM PROGRESS NOTE    SIGNIFICANT HOSPITAL EVENTS: 10/23 - 63 y.o. female presented with c/o left-sided facial weakness and headache. MRI showed acute infarcts in the right frontal white matter and right insula. CTA negative.  INTERIM HISTORY/SUBJECTIVE: Patient back at baseline. No deficits noted on physical exam today. Very subtle L facial droop.  States she has a history of migraines and has been on several anti-migraine medications. Reports waking up with a headache yesterday. Was given headache cocktail in the ED.   OBJECTIVE:  CBC    Component Value Date/Time   WBC 5.8 07/02/2024 0610   RBC 3.41 (L) 07/02/2024 0610   HGB 10.2 (L) 07/02/2024 0610   HGB 10.5 (L) 04/06/2024 1040   HCT 33.1 (L) 07/02/2024 0610   HCT 32.8 (L) 04/06/2024 1040   PLT 331 07/02/2024 0610   PLT 395 04/06/2024 1040   MCV 97.1 07/02/2024 0610   MCV 93 04/06/2024 1040   MCH 29.9 07/02/2024 0610   MCHC 30.8 07/02/2024 0610   RDW 14.1 07/02/2024 0610   RDW 13.2 04/06/2024 1040   LYMPHSABS 2.1 07/01/2024 2135   LYMPHSABS 1.3 04/06/2024 1040   MONOABS 0.6 07/01/2024 2135   EOSABS 0.2 07/01/2024 2135   EOSABS 0.1 04/06/2024 1040   BASOSABS 0.0 07/01/2024 2135   BASOSABS 0.0 04/06/2024 1040    BMET    Component Value Date/Time   NA 139 07/01/2024 2135   NA 139 04/06/2024 1040   K 3.8 07/01/2024 2135   CL 104 07/01/2024 2135   CO2 24 07/01/2024 2135   GLUCOSE 105 (H) 07/01/2024 2135   BUN 21 07/01/2024 2135   BUN 21 04/06/2024 1040   CREATININE 0.82 07/02/2024 0610   CREATININE 0.70 06/17/2022 1524   CALCIUM  9.4 07/01/2024 2135   EGFR 99 04/06/2024 1040   GFRNONAA >60 07/02/2024 0610    IMAGING past 24 hours MR Brain W and Wo Contrast Result Date: 07/02/2024 EXAM: MRI BRAIN WITH AND WITHOUT CONTRAST 07/02/2024 01:11:00 AM TECHNIQUE: Multiplanar multisequence MRI of the head/brain was performed with and without the administration of intravenous contrast. COMPARISON: CTA head/neck  as of 07/01/2024. CLINICAL HISTORY: Headache, increasing frequency or severity; Neuro deficit, acute, stroke suspected. FINDINGS: BRAIN AND VENTRICLES: Acute infarcts in the right frontal white matter and right insula. Moderate T2 hyperintensities in the white matter, compatible with chronic microvascular ischemic change. Remote left cerebelalr infarct. No acute intracranial hemorrhage. No mass effect or midline shift. No hydrocephalus. Normal flow voids. No mass or abnormal enhancement. ORBITS: No acute abnormality. SINUSES: No acute abnormality. BONES AND SOFT TISSUES: Normal bone marrow signal and enhancement. No acute soft tissue abnormality. IMPRESSION: 1. Acute infarcts in the right frontal white matter and right insula. Electronically signed by: Gilmore Molt MD 07/02/2024 01:28 AM EDT RP Workstation: [id number]   MR MRV HEAD W WO CONTRAST Result Date: 07/02/2024 EXAM: MRV BRAIN 07/02/2024 01:08:00 AM TECHNIQUE: Multiplanar multisequence MRV of the head was performed without and with the administration of 7mL gadobutrol (GADAVIST) 1 MMOL/ML injection 7 mL GADOBUTROL 1 MMOL/ML IV SOLN intravenous contrast. Multiplanar 2D and 3D reformatted images are provided for review. COMPARISON: None available. CLINICAL HISTORY: Dural venous sinus thrombosis suspected. FINDINGS: No focal stenosis or thrombus is seen of the major dural venous sinuses. IMPRESSION: 1. No evidence of dural venous sinus thrombosis. Electronically signed by: Gilmore Molt MD 07/02/2024 01:24 AM EDT RP Workstation: [id number]   CT ANGIO HEAD NECK W WO CM Result Date: 07/01/2024 EXAM: CTA HEAD AND NECK  WITHOUT AND WITH 07/01/2024 10:38:26 PM TECHNIQUE: CTA of the head and neck was performed without and with the administration of intravenous contrast. Multiplanar 2D and/or 3D reformatted images are provided for review. Automated exposure control, iterative reconstruction, and/or weight based adjustment of the mA/kV was utilized to  reduce the radiation dose to as low as reasonably achievable. Stenosis of the internal carotid arteries measured using NASCET criteria. COMPARISON: None available CLINICAL HISTORY: Neuro deficit, acute, stroke suspected. Son concerned for asymmetrical smile when face is relaxed. FINDINGS: AORTIC ARCH AND ARCH VESSELS: No dissection or arterial injury. No significant stenosis of the brachiocephalic or subclavian arteries. CERVICAL CAROTID ARTERIES: No dissection, arterial injury, or hemodynamically significant stenosis by NASCET criteria. Mild irregularity of the ICAs bilaterally at the skull base. CERVICAL VERTEBRAL ARTERIES: No dissection, arterial injury, or significant stenosis. LUNGS AND MEDIASTINUM: Unremarkable. SOFT TISSUES: Previously biopsied thyroid  nodules that were further characterized on ultrasound from 05/14/23. BONES: No acute abnormality. ANTERIOR CIRCULATION: No significant stenosis of the internal carotid arteries. No significant stenosis of the anterior cerebral arteries. No significant stenosis of the middle cerebral arteries. No aneurysm. POSTERIOR CIRCULATION: No significant stenosis of the posterior cerebral arteries. No significant stenosis of the basilar artery. No significant stenosis of the vertebral arteries. No aneurysm. OTHER: No dural venous sinus thrombosis on this non-dedicated study. IMPRESSION: 1. No large vessel occlusion or proximal hemodynamically significant stenosis. 2. Mild irregularity of the ICAs bilaterally at the skull base, which can be seen with fibromuscular dysplasia. Electronically signed by: Gilmore Molt MD 07/01/2024 11:00 PM EDT RP Workstation: [id number]    Vitals:   07/02/24 0300 07/02/24 0536 07/02/24 0915 07/02/24 0934  BP:  (!) 161/80 (!) 153/93   Pulse:  69 74   Resp:  16 16   Temp:  97.7 F (36.5 C)  97.6 F (36.4 C)  TempSrc:    Oral  SpO2:  95% 95%   Weight: 68.5 kg        PHYSICAL EXAM: General:  Alert, well-nourished, well-developed  patient in no acute distress Psych:  Mood and affect appropriate for situation CV: Regular rate and rhythm on monitor Respiratory:  Regular, unlabored respirations on room air   NEURO:  Mental Status: AA&Ox3, patient is able to give clear and coherent history Speech/Language: speech is without dysarthria or aphasia.  Naming, repetition, fluency, and comprehension intact.  Cranial Nerves:  II: PERRL. Visual fields full.  III, IV, VI: EOMI. Eyelids elevate symmetrically.  V: Sensation is intact to light touch and symmetrical to face.  VII: Face is symmetrical resting and smiling VIII: hearing intact to voice. IX, X: Palate elevates symmetrically. Phonation is normal.  KP:Dynloizm shrug 5/5. XII: tongue is midline without fasciculations. Motor: 5/5 strength to all muscle groups tested.  Tone: is normal and bulk is normal Sensation- Intact to light touch bilaterally. Extinction absent to light touch to DSS.   Coordination: FTN intact bilaterally, HKS: no ataxia in BLE.No drift.  Gait- deferred  Most Recent NIH 0   ASSESSMENT/PLAN:  Ms. LYLE Janet Randolph is a 63 y.o. female with history of hypertension, hypercholesterolemia, migraines, osteoporosis, former smoker admitted for left lower facial droop and headache.  NIH on Admission 1.  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 Patient with no focal deficits on exam today besides very subtle left facial droop. Patient to continue on DAPT, statin as highlighted below. Started Fioricet as needed for migraines. No PT/OT follow-up needed. Ordered cardiolipin and ANA labs.  Will need 30-day outpatient heart monitor. Patient to follow up outpatient in stroke clinic.   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 Mild irregularity of the ICAs bilaterally at the skull base, which can be seen with  fibromuscular dysplasia. MRI: Acute infarcts in the right frontal white matter and right insula  2D Echo ejection fraction 60 to 65%.  Left atrial size normal. LDL 71 HgbA1c 5.7 Labs: cardiolipin + ANA pending VTE prophylaxis - Lovenox No antithrombotic prior to admission, now on aspirin 81 mg daily and clopidogrel 75 mg daily for 3 weeks and then aspirin alone. Therapy recommendations:  No follow up needed  Disposition:  Home  Migraines Add Fioricet 1 tablet Q4H PRN  Hx of Stroke/TIA No prior history  Hypertension Home meds: Lisinopril  40 mg Stable Allow for permissive hypertension Long term BP goal of normotension <130/80  Hyperlipidemia Home meds: Rosuvastatin  10 mg LDL 71, goal < 70 Add Rosuvastatin  20 mg daily Continue statin at discharge  Prediabetes Home meds: None HgbA1c 5.7, goal < 7.0 Recommend close follow-up with PCP for better BG control  Other Stroke Risk Factors Family hx stroke (brother) Migraines Age >/= 33 Former tobacco use   Hospital day # 0  Ashley Gravely, MD, PGY-1  I have personally obtained history,examined this patient, reviewed notes, independently viewed imaging studies, participated in medical decision making and plan of care.ROS completed by me personally and pertinent positives fully documented  I have made any additions or clarifications directly to the above note. Agree with note above.  Patient presented with migraine headache with left facial weakness and MRI scan shows right frontal white matter and insular cortex infarct likely of cryptogenic etiology.  Recommend aspirin and Plavix for 3 weeks followed by aspirin alone and aggressive risk factor modification.  Check  anticardiolipin antibody and ANA panel..  Will need 30-day heart monitor at discharge.  Treat migraine.  Stroke team will sign off.  Kindly call for questions   I personally spent a total of 50 minutes in the care of the patient today including getting/reviewing  separately obtained history, performing a medically appropriate exam/evaluation, counseling and educating, placing orders, referring and communicating with other health care professionals, documenting clinical information in the EHR, independently interpreting results, and coordinating care.        Eather Popp, MD Medical Director [facility] Pager: [phone] 07/02/2024 3:08 PM  To contact Stroke Continuity provider, please refer to [email or web address]. After hours, contact General Neurology

## 2024-07-02 NOTE — ED Provider Notes (Signed)
 Transferred for MRI to rule out emergent causes for headache.   Patient found to have acute CVA in 2 areas.  She has persistent left-sided facial droop.  On my exam she has mild left grip strength weakness but she feels like it is her normal.  Elbow flexion extension and leg extension and flexion are normal sensation is normal.  Discussed with neurology who will see in consultation.  Discussed with hospitalist for admission.   Zamiyah Resendes, Selinda, MD 07/02/24 [phone]

## 2024-07-02 NOTE — Evaluation (Signed)
 Occupational Therapy Evaluation Patient Details Name: Janet Randolph MRN: [id number] DOB: [birth year] Today's Date: 07/02/2024   History of Present Illness   Patient is a 63 yo female presenting to the ED with headache and facial drooping on 07/01/24. CTA head and neck clear, MRI revealed acute infarct in the right frontal white matter and right insula. Admitted with acute CVA. PMH includes: hypertension, hyperlipidemia, migraines, osteoporosis, former smoker, quit in December 2024     Clinical Impressions Prior to this admission, patient living alone, working, and driving. Patient enjoys playing with her grandson and going out to eat with her friends. Currently, patient reporting 10/10 headache, blurred vision, and fatigue. Patient with no visual or cognitive deficits when assessed, and patient endorses she gets migraines with auras and her visual differences feel similiar to what she is currently experiencing. Patient is at her baseline for ADLs and transfers, with OT signing off at this time. Patient is able to have supervision initially for the first few days of being home, and was educated on the BE FAST acronym. OT will sign off at this time, please reconsult if further acute needs arise.      If plan is discharge home, recommend the following:   Assist for transportation (initially)     Functional Status Assessment   Patient has not had a recent decline in their functional status     Equipment Recommendations   None recommended by OT     Recommendations for Other Services         Precautions/Restrictions   Precautions Precautions: Fall Restrictions Weight Bearing Restrictions Per Provider Order: No     Mobility Bed Mobility Overal bed mobility: Independent                  Transfers Overall transfer level: Independent                        Balance Overall balance assessment: Modified Independent                                          ADL either performed or assessed with clinical judgement   ADL Overall ADL's : At baseline                                       General ADL Comments: Prior to this admission, patient living alone, working, and driving. Patient enjoys playing with her grandson and going out to eat with her friends. Currently, patient reporting 10/10 headache, blurred vision, and fatigue. Patient with no visual or cognitive deficits when assessed, and patient endorses she gets migraines with auras and her visual differences feel similiar to what she is currently experiencing. Patient is at her baseline for ADLs and transfers, with OT signing off at this time. Patient is able to have supervision initially for the first few days of being home, and was educated on the BE FAST acronym. OT will sign off at this time, please reconsult if further acute needs arise.     Vision Baseline Vision/History: 1 Wears glasses Ability to See in Adequate Light: 0 Adequate Patient Visual Report: Blurring of vision Vision Assessment?: Yes Eye Alignment: Within Functional Limits Ocular Range of Motion: Within Functional Limits Alignment/Gaze Preference: Within Defined Limits Tracking/Visual Pursuits: Able to  track stimulus in all quads without difficulty Saccades: Within functional limits Convergence: Within functional limits Visual Fields: No apparent deficits     Perception Perception: Within Functional Limits       Praxis Praxis: WFL       Pertinent Vitals/Pain Pain Assessment Pain Assessment: 0-10 Pain Score: 10-Worst pain ever Pain Location: headache above R eyebrow Pain Descriptors / Indicators: Penetrating, Jabbing, Headache Pain Intervention(s): Limited activity within patient's tolerance, Monitored during session, Patient requesting pain meds-RN notified     Extremity/Trunk Assessment Upper Extremity Assessment Upper Extremity Assessment: Overall WFL for tasks  assessed;Right hand dominant   Lower Extremity Assessment Lower Extremity Assessment: Overall WFL for tasks assessed   Cervical / Trunk Assessment Cervical / Trunk Assessment: Normal   Communication Communication Communication: No apparent difficulties   Cognition Arousal: Alert Behavior During Therapy: WFL for tasks assessed/performed Cognition: No apparent impairments             OT - Cognition Comments: Able to dual task without issue                 Following commands: Intact       Cueing  General Comments   Cueing Techniques: Verbal cues  VSS on RA   Exercises     Shoulder Instructions      Home Living Family/patient expects to be discharged to:: Private residence Living Arrangements: Alone Available Help at Discharge: Family;Available PRN/intermittently Type of Home: House Home Access: Stairs to enter [employer] of Steps: 3 Entrance Stairs-Rails: None Home Layout: Two level;Able to live on main level with bedroom/bathroom Alternate Level Stairs-Number of Steps: does not go upstairs   Bathroom Shower/Tub: Chief Strategy Officer: Standard     Home Equipment: None          Prior Functioning/Environment Prior Level of Function : Independent/Modified Independent;Driving;Working/employed             Mobility Comments: independent ADLs Comments: independent, does administrative work for [employer] Problem List: Decreased activity tolerance   OT Treatment/Interventions:        OT Goals(Current goals can be found in the care plan section)   Acute Rehab OT Goals Patient Stated Goal: to go home OT Goal Formulation: With patient Time For Goal Achievement: 07/16/24 Potential to Achieve Goals: Good   OT Frequency:       Co-evaluation              AM-PAC OT 6 Clicks Daily Activity     Outcome Measure Help from another person eating meals?: None Help from another person taking care of personal  grooming?: None Help from another person toileting, which includes using toliet, bedpan, or urinal?: None Help from another person bathing (including washing, rinsing, drying)?: None Help from another person to put on and taking off regular upper body clothing?: None Help from another person to put on and taking off regular lower body clothing?: None 6 Click Score: 24   End of Session Equipment Utilized During Treatment: Gait belt Nurse Communication: Mobility status;Other (comment) (IV bleeding)  Activity Tolerance: Patient tolerated treatment well Patient left: in bed;with call bell/phone within reach;Other (comment) (MD present)  OT Visit Diagnosis: Other abnormalities of gait and mobility (R26.89)                Time: 8980-8957 OT Time Calculation (min): 23 min Charges:  OT General Charges $OT Visit: 1 Visit OT Evaluation $OT Eval Moderate Complexity: 1 Mod OT Treatments $  Self Care/Home Management : 8-22 mins  Ronal Gift E. Maleeah Crossman, OTR/L Acute Rehabilitation Services [phone]   Ronal Gift Salt 07/02/2024, 12:37 PM

## 2024-07-02 NOTE — Consult Note (Signed)
 NEUROLOGY CONSULT NOTE   Date of service: July 02, 2024 Patient Name: Janet Randolph MRN:  [id number] DOB:  1961-03-15 Chief Complaint: Facial weakness Requesting Provider: Shona Terry SAILOR, DO  History of Present Illness  Janet Randolph is a 63 y.o. female with hx of facial weakness.  She first noticed it when her son pointed out around dinnertime, and thinks that she was last definitely normal around lunchtime yesterday.  LKW: Lunchtime 10/23 Modified rankin score: 0-Completely asymptomatic and back to baseline post- stroke IV Thrombolysis: No, outside of window EVT: No, no LVO  NIHSS components Score: Comment  1a Level of Conscious 0[]  1[]  2[]  3[]      1b LOC Questions 0[]  1[]  2[]       1c LOC Commands 0[]  1[]  2[]       2 Best Gaze 0[]  1[]  2[]       3 Visual 0[]  1[]  2[]  3[]      4 Facial Palsy 0[]  1[x]  2[]  3[]      5a Motor Arm - left 0[]  1[]  2[]  3[]  4[]  UN[]    5b Motor Arm - Right 0[]  1[]  2[]  3[]  4[]  UN[]    6a Motor Leg - Left 0[]  1[]  2[]  3[]  4[]  UN[]    6b Motor Leg - Right 0[]  1[]  2[]  3[]  4[]  UN[]    7 Limb Ataxia 0[]  1[]  2[]  UN[]      8 Sensory 0[]  1[]  2[]  UN[]      9 Best Language 0[]  1[]  2[]  3[]      10 Dysarthria 0[]  1[]  2[]  UN[]      11 Extinct. and Inattention 0[]  1[]  2[]       TOTAL: 1    Past History   Past Medical History:  Diagnosis Date   Colitis    Depression    Eczema    Hyperlipidemia    Hypertension    Spinal stenosis 2024   Vitamin D  deficiency     Past Surgical History:  Procedure Laterality Date   HAND SURGERY Bilateral    MOLE REMOVAL  06/12/2022   TEE WITHOUT CARDIOVERSION N/A 01/02/2022   Procedure: TRANSESOPHAGEAL ECHOCARDIOGRAM (TEE);  Surgeon: Pietro Redell RAMAN, MD;  Location: [facility] ENDOSCOPY;  Service: Cardiovascular;  Laterality: N/A;    Family History: Family History  Problem Relation Age of Onset   Melanoma Mother    Asthma Father    Crohn's disease Father    Melanoma Father    Asthma Brother    Heart Problems Brother    Stroke  Brother    Healthy Son    Healthy Daughter     Social History  reports that she quit smoking about 12 months ago. Her smoking use included cigarettes. She has never been exposed to tobacco smoke. She has never used smokeless tobacco. She reports current alcohol use. She reports that she does not use drugs.  Allergies  Allergen Reactions   Latex Rash   Penicillins Rash    Medications   Current Facility-Administered Medications:    [START ON 07/03/2024]  stroke: early stages of recovery book, , Does not apply, Once, Hall, Carole N, DO   [START ON 07/03/2024] aspirin EC tablet 81 mg, 81 mg, Oral, Daily, Hall, Carole N, DO   atorvastatin  (LIPITOR) tablet 80 mg, 80 mg, Oral, Daily, Shona Terry N, DO, 80 mg at 07/02/24 0527   [START ON 07/03/2024] clopidogrel (PLAVIX) tablet 75 mg, 75 mg, Oral, Daily, Hall, Carole N, DO   enoxaparin (LOVENOX) injection 40 mg, 40 mg, Subcutaneous, Q24H, Hall, Carole N, DO   lactated  ringers infusion, , Intravenous, Continuous, Shona Terry SAILOR, [place], Last Rate: 75 mL/hr at 07/02/24 0535, New Bag at 07/02/24 0535  Current Outpatient Medications:    acetaminophen  (TYLENOL ) 500 MG tablet, Take 500 mg by mouth every 6 (six) hours as needed., Disp: , Rfl:    ALPRAZolam  (XANAX ) 0.5 MG tablet, Take 1 tablet (0.5 mg total) by mouth 2 (two) times daily as needed. (Patient taking differently: Take 0.5 mg by mouth 2 (two) times daily as needed for anxiety.), Disp: 60 tablet, Rfl: 5   CALCIUM  PO, Take 1 tablet by mouth daily., Disp: , Rfl:    ciprofloxacin  (CIPRO ) 500 MG tablet, Take 1 tablet (500 mg total) by mouth 2 (two) times daily., Disp: 10 tablet, Rfl: 0   desonide  (DESOWEN ) 0.05 % cream, Apply topically 2 (two) times daily. (Patient taking differently: Apply 1 application  topically 2 (two) times daily as needed (rash).), Disp: 60 g, Rfl: 2   esomeprazole  (NEXIUM ) 40 MG capsule, TAKE 1 CAPSULE (40 MG TOTAL) BY MOUTH DAILY. TAKE 30 MIN BEFORE MEAL, Disp: 90 capsule,  Rfl: 1   lisinopril  (ZESTRIL ) 40 MG tablet, Take 1 tablet (40 mg total) by mouth daily., Disp: 90 tablet, Rfl: 1   loperamide (IMODIUM) 2 MG capsule, Take 4 mg by mouth daily as needed for diarrhea or loose stools., Disp: , Rfl:    rosuvastatin  (CRESTOR ) 10 MG tablet, Take 1 tablet (10 mg total) by mouth daily., Disp: 90 tablet, Rfl: 1   venlafaxine  XR (EFFEXOR -XR) 75 MG 24 hr capsule, Take 2 capsules (150 mg total) by mouth daily with breakfast. (Patient taking differently: Take 75 mg by mouth daily with breakfast.), Disp: 180 capsule, Rfl: 1   VITAMIN D  PO, Take 1,000 Units by mouth daily., Disp: , Rfl:   Vitals   Vitals:   07-22-24 2332 22-Jul-2024 2358 07/02/24 0300 07/02/24 0536  BP: (!) 146/84 (!) 148/84  (!) 161/80  Pulse: 73 77  69  Resp: 16 16  16   Temp:  97.8 F (36.6 C)  97.7 F (36.5 C)  TempSrc:      SpO2: 90% 94%  95%  Weight:   68.5 kg     Body mass index is 26.75 kg/m.   Physical Exam   Constitutional: Appears well-developed and well-nourished.   Neurologic Examination    Neuro: Mental Status: Patient is awake, alert, oriented to person, place, month, year, and situation. Patient is able to give a clear and coherent history. No signs of aphasia or neglect Cranial Nerves: II: Visual Fields are full. Pupils are equal, round, and reactive to light.   III,IV, VI: EOMI without ptosis or diploplia.  V: Facial sensation is symmetric to temperature VII: Facial movement is weak on the left VIII: hearing is intact to voice X: Uvula elevates symmetrically XII: tongue is midline without atrophy or fasciculations.  Motor: Tone is normal. Bulk is normal. 5/5 strength was present in all four extremities.  Sensory: Sensation is symmetric to light touch and temperature in the arms and legs. Cerebellar: FNF and HKS are intact bilaterally        Labs/Imaging/Neurodiagnostic studies   CBC:  Recent Labs  Lab 07-22-2024 2135 07/02/24 0610  WBC 7.9 5.8  NEUTROABS  5.0  --   HGB 10.6* 10.2*  HCT 34.2* 33.1*  MCV 96.1 97.1  PLT 375 331   Basic Metabolic Panel:  Lab Results  Component Value Date   NA 139 07/22/2024   K 3.8 2024-07-22   CO2  24 07/01/2024   GLUCOSE 105 (H) 07/01/2024   BUN 21 07/01/2024   CREATININE 1.11 (H) 07/01/2024   CALCIUM  9.4 07/01/2024   GFRNONAA 55 (L) 07/01/2024   GFRAA 112 02/22/2020   Lipid Panel:  Lab Results  Component Value Date   LDLCALC 69 04/06/2024   HgbA1c:  Lab Results  Component Value Date   HGBA1C 5.7 (H) 07/02/2024   MRI Brain(Personally reviewed): Cortical/Subcortical stroke on the right  ASSESSMENT   Janet Randolph is a 63 y.o. female with history of hypertension, hyperlipidemia, tobacco abuse (quit in December) who presents with facial weakness that was noticed by her son.  Given that she did not noticed the onset, is slightly unclear but she estimates that she last looked in the mirror sometime around lunch yesterday.  She will need a workup for both intrinsic vessel disease as well as embolism.  RECOMMENDATIONS  - HgbA1c, fasting lipid panel - MRI of the brain without contrast - Frequent neuro checks - Echocardiogram - CTA head and neck - Prophylactic therapy-Antiplatelet med: Aspirin - dose 81mg  and plavix 75mg  daily  after 300mg  load  - Risk factor modification - Telemetry monitoring - PT consult, OT consult, Speech consult - Stroke team to follow  ______________________________________________________________________    Signed, Aisha Seals, MD [facility]

## 2024-07-02 NOTE — Progress Notes (Signed)
 PT Cancellation Note  Patient Details Name: Janet Randolph MRN: [id number] DOB: 1961/03/01   Cancelled Treatment:    Reason Eval/Treat Not Completed: PT screened, no needs identified, will sign off. Per OT, pt Independent and has no PT needs.    Stephane JULIANNA Bevel 07/02/2024, 10:53 AM Arsenio Schnorr M,PT Acute Rehab Services [phone]

## 2024-07-02 NOTE — ED Notes (Signed)
 Pt arrives via POV from [facility] for MRI. Pt vitals updated upon arrival, Charge Rn notified.

## 2024-07-02 NOTE — H&P (Addendum)
 History and Physical  Janet Randolph FMW:[id number] DOB: [birth year] DOA: 07/01/2024  Referring physician: Dr. Lorette, EDP  PCP: Gladis Mustard, FNP  Outpatient Specialists: Rheumatology. Patient coming from: Home.  Chief Complaint: Left lower facial droop with headache.  HPI: Janet Randolph is a 63 y.o. female with medical history significant for hypertension, hyperlipidemia, migraines, osteoporosis, former smoker, quit in December 2024, who presents to the ER due to left lower facial droop noticed by her son around 6:30 PM on 07/01/2024.  Associated with worsening headache.  Denies any other motor or sensory deficits.  Woke up with a headache but was worse this morning, took Goody powder.  She initially presented at [facility] ED.  Felt better following headache-cocktail administered there.  Due to her left lower facial droop and pertinent risk factors, CT angio head and neck were obtained.  They revealed no large vessel occlusion or proximal hemodynamically significant stenosis, mild irregularity of the ICAs bilaterally at the skull base, which can be seen with fibromuscular dysplasia.  EDP discussed the case with neurology/stroke team as well as [facility] EDP.  The patient was sent to [facility], ED to ED for further evaluation including brain MRI with and without contrast.  In [facility] ER, brain MRI with and without contrast revealed acute infarct in the right frontal white matter and right insula.  Neurology/stroke team consulted.  Admitted by [facility], hospitalist service, for a complete CVA workup.  ED Course: Temperature 97.8.  BP 140/84, pulse 77, respiration rate 16, O2 saturation 94% on room air.  Review of Systems: Review of systems as noted in the HPI. All other systems reviewed and are negative.   Past Medical History:  Diagnosis Date   Colitis    Depression    Eczema    Hyperlipidemia    Hypertension    Spinal stenosis 2024   Vitamin D  deficiency    Past Surgical History:   Procedure Laterality Date   HAND SURGERY Bilateral    MOLE REMOVAL  06/12/2022   TEE WITHOUT CARDIOVERSION N/A 01/02/2022   Procedure: TRANSESOPHAGEAL ECHOCARDIOGRAM (TEE);  Surgeon: Pietro Redell RAMAN, MD;  Location: [facility] ENDOSCOPY;  Service: Cardiovascular;  Laterality: N/A;    Social History:  reports that she quit smoking about 12 months ago. Her smoking use included cigarettes. She has never been exposed to tobacco smoke. She has never used smokeless tobacco. She reports current alcohol use. She reports that she does not use drugs.   Allergies  Allergen Reactions   Latex Rash   Penicillins Rash    Family History  Problem Relation Age of Onset   Melanoma Mother    Asthma Father    Crohn's disease Father    Melanoma Father    Asthma Brother    Heart Problems Brother    Stroke Brother    Healthy Son    Healthy Daughter       Prior to Admission medications   Medication Sig Start Date End Date Taking? Authorizing Provider  acetaminophen  (TYLENOL ) 500 MG tablet Take 500 mg by mouth every 6 (six) hours as needed.    [provider]  ALPRAZolam  (XANAX ) 0.5 MG tablet Take 1 tablet (0.5 mg total) by mouth 2 (two) times daily as needed. 04/06/24   Gladis Mary-Margaret, FNP  CALCIUM  PO Take by mouth daily.    [provider]  ciprofloxacin  (CIPRO ) 500 MG tablet Take 1 tablet (500 mg total) by mouth 2 (two) times daily. 04/12/24   Gladis Mustard, FNP  Cyanocobalamin (VITAMIN  B-12 PO) Take by mouth daily.    [provider]  desonide  (DESOWEN ) 0.05 % cream Apply topically 2 (two) times daily. Patient taking differently: Apply 1 application  topically 2 (two) times daily as needed (rash). 06/23/17   Gladis, Mary-Margaret, FNP  esomeprazole  (NEXIUM ) 40 MG capsule TAKE 1 CAPSULE (40 MG TOTAL) BY MOUTH DAILY. TAKE 30 MIN BEFORE MEAL 05/04/24   Gladis, Mary-Margaret, FNP  lisinopril  (ZESTRIL ) 40 MG tablet Take 1 tablet (40 mg total) by mouth daily. 04/06/24    Gladis, Mary-Margaret, FNP  loperamide (IMODIUM) 2 MG capsule Take 4 mg by mouth daily as needed for diarrhea or loose stools.    [provider]  rosuvastatin  (CRESTOR ) 10 MG tablet Take 1 tablet (10 mg total) by mouth daily. 04/06/24   Gladis Mary-Margaret, FNP  sulfamethoxazole -trimethoprim  (BACTRIM  DS) 800-160 MG tablet Take 1 tablet by mouth 2 (two) times daily. 04/06/24   Gladis Mustard, FNP  venlafaxine  XR (EFFEXOR -XR) 75 MG 24 hr capsule Take 2 capsules (150 mg total) by mouth daily with breakfast. 04/06/24   Gladis, Mary-Margaret, FNP  VITAMIN D  PO Take 1,000 Units by mouth daily.    [provider]    Physical Exam: BP (!) 148/84 (BP Location: Right Arm)   Pulse 77   Temp 97.8 F (36.6 C)   Resp 16   Wt 68.5 kg Comment: Wt from 04/06/2024  SpO2 94%   BMI 26.75 kg/m   General: 63 y.o. year-old female well developed well nourished in no acute distress.  Alert and oriented x3. Cardiovascular: Regular rate and rhythm with no rubs or gallops.  No thyromegaly or JVD noted.  No lower extremity edema. 2/4 pulses in all 4 extremities. Respiratory: Clear to auscultation with no wheezes or rales. Good inspiratory effort. Abdomen: Soft nontender nondistended with normal bowel sounds x4 quadrants. Muskuloskeletal: No cyanosis, clubbing or edema noted bilaterally Neuro: CN II-XII intact, strength, sensation, reflexes.  Mild left lower facial droop. Skin: No ulcerative lesions noted or rashes Psychiatry: Judgement and insight appear normal. Mood is appropriate for condition and setting          Labs on Admission:  Basic Metabolic Panel: Recent Labs  Lab 07/01/24 2135  NA 139  K 3.8  CL 104  CO2 24  GLUCOSE 105*  BUN 21  CREATININE 1.11*  CALCIUM  9.4   Liver Function Tests: No results for input(s): AST, ALT, ALKPHOS, BILITOT, PROT, ALBUMIN in the last 168 hours. No results for input(s): LIPASE, AMYLASE in the last 168 hours. No results  for input(s): AMMONIA in the last 168 hours. CBC: Recent Labs  Lab 07/01/24 2135  WBC 7.9  NEUTROABS 5.0  HGB 10.6*  HCT 34.2*  MCV 96.1  PLT 375   Cardiac Enzymes: No results for input(s): CKTOTAL, CKMB, CKMBINDEX, TROPONINI in the last 168 hours.  BNP (last 3 results) No results for input(s): BNP in the last 8760 hours.  ProBNP (last 3 results) No results for input(s): PROBNP in the last 8760 hours.  CBG: No results for input(s): GLUCAP in the last 168 hours.  Radiological Exams on Admission: MR Brain W and Wo Contrast Result Date: 07/02/2024 EXAM: MRI BRAIN WITH AND WITHOUT CONTRAST 07/02/2024 01:11:00 AM TECHNIQUE: Multiplanar multisequence MRI of the head/brain was performed with and without the administration of intravenous contrast. COMPARISON: CTA head/neck as of 07/01/2024. CLINICAL HISTORY: Headache, increasing frequency or severity; Neuro deficit, acute, stroke suspected. FINDINGS: BRAIN AND VENTRICLES: Acute infarcts in the right frontal white matter  and right insula. Moderate T2 hyperintensities in the white matter, compatible with chronic microvascular ischemic change. Remote left cerebelalr infarct. No acute intracranial hemorrhage. No mass effect or midline shift. No hydrocephalus. Normal flow voids. No mass or abnormal enhancement. ORBITS: No acute abnormality. SINUSES: No acute abnormality. BONES AND SOFT TISSUES: Normal bone marrow signal and enhancement. No acute soft tissue abnormality. IMPRESSION: 1. Acute infarcts in the right frontal white matter and right insula. Electronically signed by: Gilmore Molt MD 07/02/2024 01:28 AM EDT RP Workstation: [id number]   MR MRV HEAD W WO CONTRAST Result Date: 07/02/2024 EXAM: MRV BRAIN 07/02/2024 01:08:00 AM TECHNIQUE: Multiplanar multisequence MRV of the head was performed without and with the administration of 7mL gadobutrol (GADAVIST) 1 MMOL/ML injection 7 mL GADOBUTROL 1 MMOL/ML IV SOLN intravenous  contrast. Multiplanar 2D and 3D reformatted images are provided for review. COMPARISON: None available. CLINICAL HISTORY: Dural venous sinus thrombosis suspected. FINDINGS: No focal stenosis or thrombus is seen of the major dural venous sinuses. IMPRESSION: 1. No evidence of dural venous sinus thrombosis. Electronically signed by: Gilmore Molt MD 07/02/2024 01:24 AM EDT RP Workstation: [id number]   CT ANGIO HEAD NECK W WO CM Result Date: 07/01/2024 EXAM: CTA HEAD AND NECK WITHOUT AND WITH 07/01/2024 10:38:26 PM TECHNIQUE: CTA of the head and neck was performed without and with the administration of intravenous contrast. Multiplanar 2D and/or 3D reformatted images are provided for review. Automated exposure control, iterative reconstruction, and/or weight based adjustment of the mA/kV was utilized to reduce the radiation dose to as low as reasonably achievable. Stenosis of the internal carotid arteries measured using NASCET criteria. COMPARISON: None available CLINICAL HISTORY: Neuro deficit, acute, stroke suspected. Son concerned for asymmetrical smile when face is relaxed. FINDINGS: AORTIC ARCH AND ARCH VESSELS: No dissection or arterial injury. No significant stenosis of the brachiocephalic or subclavian arteries. CERVICAL CAROTID ARTERIES: No dissection, arterial injury, or hemodynamically significant stenosis by NASCET criteria. Mild irregularity of the ICAs bilaterally at the skull base. CERVICAL VERTEBRAL ARTERIES: No dissection, arterial injury, or significant stenosis. LUNGS AND MEDIASTINUM: Unremarkable. SOFT TISSUES: Previously biopsied thyroid  nodules that were further characterized on ultrasound from 05/14/23. BONES: No acute abnormality. ANTERIOR CIRCULATION: No significant stenosis of the internal carotid arteries. No significant stenosis of the anterior cerebral arteries. No significant stenosis of the middle cerebral arteries. No aneurysm. POSTERIOR CIRCULATION: No significant stenosis of the  posterior cerebral arteries. No significant stenosis of the basilar artery. No significant stenosis of the vertebral arteries. No aneurysm. OTHER: No dural venous sinus thrombosis on this non-dedicated study. IMPRESSION: 1. No large vessel occlusion or proximal hemodynamically significant stenosis. 2. Mild irregularity of the ICAs bilaterally at the skull base, which can be seen with fibromuscular dysplasia. Electronically signed by: Gilmore Molt MD 07/01/2024 11:00 PM EDT RP Workstation: [id number]    EKG: I independently viewed the EKG done and my findings are as followed: Sinus rhythm rate of 77.  QTc 458.  RBBB.  Assessment/Plan Present on Admission:  Acute CVA (cerebrovascular accident) (HCC)  Principal Problem:   Acute CVA (cerebrovascular accident) (HCC)  Acute CVA CT angio head and neck, no LVO. Brain MRI with and without contrast revealed acute infarct in the right frontal white matter and right insula.  Stroke workup in progress Follow transthoracic echo, fasting lipid panel Frequent neurochecks Permissive hypertension PT/OT/speech therapist evaluation Aspirin and Plavix x 21 days, then monotherapy antiplatelet alone. High intensity statin. Monitor on telemetry.  Hypertension Ongoing permissive hypertension. Treat SBP greater than 220 or  DBP greater than 120. Closely monitor vital signs.  Hyperlipidemia High intensity statin Goal LDL less than 70.  AKI, likely prerenal from dehydration Baseline creatinine 0.6 with GFR greater than 60 Presented with creatinine 1.11 with GFR 55 IV fluid hydration LR at 75 cc/h x 1 day Monitor urine output.  Migraines Prior to admission was taking Goody powders Advised against the use of Goody powders with antiplatelets to avoid GI complications. As needed analgesics.   Time: 75 minutes   DVT prophylaxis: Subcu Lovenox daily.  Code Status: Full code.  Family Communication: None at bedside.  Disposition Plan: Admitted to  telemetry medical unit.  Consults called: Neurology/stroke team.  Admission status: Observation status.   Status is: Observation    Terry LOISE Hurst MD [facility] Pager [phone]  If 7PM-7AM, please contact night-coverage [email or web address] Password [email or web address]  07/02/2024, 3:58 AM

## 2024-07-03 ENCOUNTER — Encounter: Payer: Self-pay | Admitting: Rheumatology

## 2024-07-03 DIAGNOSIS — I639 Cerebral infarction, unspecified: Secondary | ICD-10-CM | POA: Diagnosis not present

## 2024-07-03 MED ORDER — BUTALBITAL-APAP-CAFFEINE 50-325-40 MG PO TABS: 1.0000 | ORAL_TABLET | ORAL | 0 refills | Status: DC | PRN

## 2024-07-03 MED ORDER — CLOPIDOGREL BISULFATE 75 MG PO TABS: 75.0000 mg | ORAL_TABLET | Freq: Every day | ORAL | 0 refills | Status: DC

## 2024-07-03 MED ORDER — ASPIRIN 81 MG PO TBEC: 81.0000 mg | DELAYED_RELEASE_TABLET | Freq: Every day | ORAL | 12 refills | Status: AC

## 2024-07-03 MED ORDER — ATORVASTATIN CALCIUM 80 MG PO TABS: 80.0000 mg | ORAL_TABLET | Freq: Every day | ORAL | 2 refills | Status: DC

## 2024-07-03 NOTE — Discharge Summary (Signed)
 Physician Discharge Summary  Patient ID: Janet Randolph MRN: [id number] DOB/AGE: [birth year] 63 y.o.  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  Patient was admitted with left lower facial droop and headache.  MRI brain with and without contrast revealed acute infarcts in the right frontal white matter and right insula.  Neurology is directing care.  Patient is on aspirin and Plavix.  Facial droop has resolved.   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 Mild irregularity of the ICAs bilaterally at the skull base, which can be seen with fibromuscular dysplasia. MRI: Acute infarcts in the right frontal white matter and right insula  2D Echo ejection fraction 60 to 65%.  Left atrial size normal. LDL 71 HgbA1c 5.7 Labs: cardiolipin + ANA pending No antithrombotic prior to admission, now on aspirin 81 mg daily and clopidogrel 75 mg daily for 3 weeks and then aspirin alone.   Hypertension:: -Permissive hypertension initially. - Continue to optimize.  Hyperlipidemia High intensity statin Goal LDL less than 70. Continue Lipitor 80 mg p.o. once daily.   AKI, likely prerenal from dehydration Baseline creatinine 0.6 with GFR greater than 60 Presented with creatinine 1.11 with GFR 55 Patient has been adequately hydrated. Patient was on Goody powder prior to presentation.  Counseled to quit all nephrotoxins   Migraines Fioricet as needed   Consults: neurology  Significant Diagnostic Studies:  MRI brain with and without  contrast revealed: Acute infarcts in the right frontal white matter and right insula.   CTA head and neck with and without contrast revealed: 1. No large vessel occlusion or proximal hemodynamically significant stenosis. 2. Mild irregularity of the ICAs bilaterally at the skull base, which can be seen with fibromuscular dysplasia.  Echocardiogram revealed:  1. Left ventricular ejection fraction, by estimation, is 60 to 65%. The  left ventricle has normal function. The left ventricle has no regional  wall motion abnormalities. Left ventricular diastolic parameters are  consistent with Grade I diastolic  dysfunction (impaired relaxation).   2. Right ventricular systolic function is normal. The right ventricular  size is normal.   3. The mitral valve is normal in structure. Trivial mitral valve  regurgitation. No evidence of mitral stenosis.   4. The aortic valve is tricuspid. There is mild calcification of the  aortic valve. Aortic valve regurgitation is not visualized. Aortic valve  sclerosis/calcification is present, without any evidence of aortic  stenosis.   5. The inferior vena cava is normal in size with greater than 50%  respiratory variability, suggesting right atrial pressure of 3 mmHg.    Discharge Exam: Blood pressure (!) 152/84, pulse 66, temperature (!) 97.5 F (36.4 C), resp. rate 20, weight 68.5 kg, SpO2 96%.   Disposition: Discharge disposition: 01-Home or Self Care       Discharge Instructions     Diet - low sodium heart healthy   Complete by: As directed    Increase activity slowly   Complete by: As directed       Allergies as of 07/03/2024       Reactions   Latex Rash   Penicillins Rash  Medication List     STOP taking these medications    acetaminophen  500 MG tablet Commonly known as: TYLENOL    CALCIUM  PO   ciprofloxacin  500 MG tablet Commonly known as: Cipro    desonide  0.05 % cream Commonly known as: DESOWEN    loperamide  2 MG capsule Commonly known as: IMODIUM   rosuvastatin  10 MG tablet Commonly known as: CRESTOR    VITAMIN D  PO       TAKE these medications    ALPRAZolam  0.5 MG tablet Commonly known as: XANAX  Take 1 tablet (0.5 mg total) by mouth 2 (two) times daily as needed. What changed: reasons to take this   aspirin EC 81 MG tablet Take 1 tablet (81 mg total) by mouth daily. Swallow whole. Start taking on: July 04, 2024   atorvastatin  80 MG tablet Commonly known as: LIPITOR Take 1 tablet (80 mg total) by mouth daily. Start taking on: July 04, 2024   butalbital-acetaminophen -caffeine 50-325-40 MG tablet Commonly known as: FIORICET Take 1 tablet by mouth every 4 (four) hours as needed for migraine.   clopidogrel 75 MG tablet Commonly known as: PLAVIX Take 1 tablet (75 mg total) by mouth daily. Start taking on: July 04, 2024   esomeprazole  40 MG capsule Commonly known as: NEXIUM  TAKE 1 CAPSULE (40 MG TOTAL) BY MOUTH DAILY. TAKE 30 MIN BEFORE MEAL   lisinopril  40 MG tablet Commonly known as: ZESTRIL  Take 1 tablet (40 mg total) by mouth daily.   venlafaxine  XR 75 MG 24 hr capsule Commonly known as: EFFEXOR -XR Take 2 capsules (150 mg total) by mouth daily with breakfast. What changed: how much to take        Follow-up Information     [facility] of [facility] [employer] Follow up.   Specialty: Cardiology Why: Cardiology office will mail you a heart monitor to wear for 30 days. The cardiology team has also scheduled you for a follow-up appointment with Dr. Lavona to discuss the results on Tuesday Aug 24, 2024 3:20 PM (Arrive by 3:00 PM). Contact information: [address] [place]  [phone]               Time spent: 35 minutes.  SignedBETHA Leatrice Randolph Rosario 07/03/2024, 12:19 PM

## 2024-07-03 NOTE — Plan of Care (Signed)
 Pt D/C home with cardiac recorder being mailed there. AVS reviewed and follow up questions answered, all belongings returned to patients satisfcation. Hospital equipment removed. No other needs voiced at this time. BP (!) 152/84 (BP Location: Left Arm)   Pulse 66   Temp (!) 97.5 F (36.4 C)   Resp 20   Wt 68.5 kg Comment: Wt from 04/06/2024  SpO2 96%   BMI 26.75 kg/m  Janet Randolph Louder 07/03/24 12:07 PM

## 2024-07-03 NOTE — Care Management Obs Status (Signed)
 MEDICARE OBSERVATION STATUS NOTIFICATION   Patient Details  Name: NIAYA HICKOK MRN: [id number] Date of Birth: 1961-05-07   Medicare Observation Status Notification Given:  Yes    Carletha Spruce, RN 07/03/2024, 8:58 AM

## 2024-07-03 NOTE — Plan of Care (Signed)

## 2024-07-04 LAB — ANA W/REFLEX IF POSITIVE: Anti Nuclear Antibody (ANA): NEGATIVE

## 2024-07-07 LAB — CARDIOLIPIN ANTIBODIES, IGG, IGM, IGA
Anticardiolipin IgA: <9 U/mL (ref 0–11)
Anticardiolipin IgG: <9 GPL U/mL (ref 0–14)
Anticardiolipin IgM: <9 [MPL'U]/mL (ref 0–12)

## 2024-07-08 ENCOUNTER — Ambulatory Visit: Admitting: Nurse Practitioner

## 2024-07-08 ENCOUNTER — Encounter: Payer: Self-pay | Admitting: Nurse Practitioner

## 2024-07-08 DIAGNOSIS — Z09 Encounter for follow-up examination after completed treatment for conditions other than malignant neoplasm: Secondary | ICD-10-CM | POA: Diagnosis not present

## 2024-07-08 DIAGNOSIS — G441 Vascular headache, not elsewhere classified: Secondary | ICD-10-CM

## 2024-07-08 NOTE — Patient Instructions (Signed)
 Form - Headache Record There are many types and causes of headaches. A headache record can help guide your treatment plan. Use this form to record the details. Bring this form with you to your follow-up visits. Follow your health care provider's instructions on how to describe your headache. You may be asked to: Use a pain scale. This is a tool to rate the intensity of your headache using words or numbers. Describe what your headache feels like, such as dull, achy, throbbing, or sharp. Headache record Date: _______________ Time (from start to end): ____________________ Location of the headache: _________________________ Intensity of the headache: ____________________ Description of the headache: ______________________________________________________________ Hours of sleep the night before the headache: __________ Food or drinks before the headache started: ______________________________________________________________________________________ Events before the headache started: _______________________________________________________________________________________________ Symptoms before the headache started: __________________________________________________________________________________________ Symptoms during the headache: __________________________________________________________________________________________________ Treatment: ________________________________________________________________________________________________________________ Effect of treatment: _________________________________________________________________________________________________________ Other comments: ___________________________________________________________________________________________________________ Date: _______________ Time (from start to end): ____________________ Location of the headache: _________________________ Intensity of the headache: ____________________ Description of the headache:  ______________________________________________________________ Hours of sleep the night before the headache: __________ Food or drinks before the headache started: ______________________________________________________________________________________ Events before the headache started: ____________________________________________________________________________________________ Symptoms before the headache started: _________________________________________________________________________________________ Symptoms during the headache: _______________________________________________________________________________________________ Treatment: ________________________________________________________________________________________________________________ Effect of treatment: _________________________________________________________________________________________________________ Other comments: ___________________________________________________________________________________________________________ Date: _______________ Time (from start to end): ____________________ Location of the headache: _________________________ Intensity of the headache: ____________________ Description of the headache: ______________________________________________________________ Hours of sleep the night before the headache: __________ Food or drinks before the headache started: ______________________________________________________________________________________ Events before the headache started: ____________________________________________________________________________________________ Symptoms before the headache started: _________________________________________________________________________________________ Symptoms during the headache: _______________________________________________________________________________________________ Treatment:  ________________________________________________________________________________________________________________ Effect of treatment: _________________________________________________________________________________________________________ Other comments: ___________________________________________________________________________________________________________ Date: _______________ Time (from start to end): ____________________ Location of the headache: _________________________ Intensity of the headache: ____________________ Description of the headache: ______________________________________________________________ Hours of sleep the night before the headache: _________ Food or drinks before the headache started: ______________________________________________________________________________________ Events before the headache started: ____________________________________________________________________________________________ Symptoms before the headache started: _________________________________________________________________________________________ Symptoms during the headache: _______________________________________________________________________________________________ Treatment: ________________________________________________________________________________________________________________ Effect of treatment: _________________________________________________________________________________________________________ Other comments: ___________________________________________________________________________________________________________ Date: _______________ Time (from start to end): ____________________ Location of the headache: _________________________ Intensity of the headache: ____________________ Description of the headache: ______________________________________________________________ Hours of sleep the night before the headache: _________ Food or drinks before the headache started:  ______________________________________________________________________________________ Events before the headache started: ____________________________________________________________________________________________ Symptoms before the headache started: _________________________________________________________________________________________ Symptoms during the headache: _______________________________________________________________________________________________ Treatment: ________________________________________________________________________________________________________________ Effect of treatment: _________________________________________________________________________________________________________ Other comments: ___________________________________________________________________________________________________________ This information is not intended to replace advice given to you by your health care provider. Make sure you discuss any questions you have with your health care provider. Document Revised: 01/24/2021 Document Reviewed: 01/24/2021 Elsevier Patient Education  2024 [employer].

## 2024-07-08 NOTE — Progress Notes (Signed)
 Subjective:    Patient ID: Janet Randolph, female    DOB: 1960/11/24, 63 y.o.   MRN: [id number]   Chief Complaint: Hospitalization Follow-up   HPI  Patient went to ED with migraine on 07/01/24. She was admitted and dx with right peri-insular infarct of MCA. They are saying was embolic in nature. Carotid studies were clear. When discharged she was back to baseline. She is now on plavix and baby aspirin daily. She to  only take plavix for 3 weeks bu is to remain on baby aspirin daily. Dr. Edison office is going to mail her a heart monitor to wear for a couple of weeks and she will see him in person on December 16. Since going home she has felt fine. No headache, no weakness, no facial drooping. She has still been having headaches and that has been going on for over a year. Rates headache 6/10 currently. No blurred vision or dizziness  Patient Active Problem List   Diagnosis Date Noted   Acute CVA (cerebrovascular accident) (HCC) 07/02/2024   Age-related osteoporosis without fracture 12/20/2022   History of wrist fracture 12/20/2022   GERD (gastroesophageal reflux disease) 04/03/2016   Eczema of both hands 07/12/2015   GAD (generalized anxiety disorder) 06/04/2013   Hyperlipidemia 03/05/2013   Hypertension 03/05/2013   Depression 02/09/2013       Review of Systems  Constitutional:  Negative for diaphoresis.  Eyes:  Negative for pain.  Respiratory:  Negative for shortness of breath.   Cardiovascular:  Negative for chest pain, palpitations and leg swelling.  Gastrointestinal:  Negative for abdominal pain.  Endocrine: Negative for polydipsia.  Skin:  Negative for rash.  Neurological:  Negative for dizziness, weakness and headaches.  Hematological:  Does not bruise/bleed easily.  All other systems reviewed and are negative.      Objective:   Physical Exam Constitutional:      Appearance: Normal appearance.  Cardiovascular:     Rate and Rhythm: Normal rate and regular  rhythm.     Heart sounds: Normal heart sounds.  Pulmonary:     Breath sounds: Normal breath sounds.  Skin:    General: Skin is warm.  Neurological:     General: No focal deficit present.     Mental Status: She is alert and oriented to person, place, and time.     Cranial Nerves: No cranial nerve deficit.     Sensory: No sensory deficit.  Psychiatric:        Mood and Affect: Mood normal.        Behavior: Behavior normal.     BP (!) 146/88   Pulse 76   Temp 97.6 F (36.4 C) (Temporal)   Ht 5' 3 (1.6 m)   Wt 152 lb (68.9 kg)   SpO2 96%   BMI 26.93 kg/m        Assessment & Plan:   Janet Randolph in today with chief complaint of Hospitalization Follow-up   1. Arterial ischemic stroke, MCA (middle cerebral artery), right, perintl (HCC) (Primary) Keep follow up with neurology  Stay on plavix 2 more weeks  Continue baby aspirin  2. Other vascular headache Referral to headache clinic Keep diary of headache to take to clinic - AMB referral to headache clinic  3. Hospital discharge follow-up Hospital records reviewed Apply ZIO monitor sent to you by Dr. Edison    The above assessment and management plan was discussed with the patient. The patient verbalized understanding of and has agreed to  the management plan. Patient is aware to call the clinic if symptoms persist or worsen. Patient is aware when to return to the clinic for a follow-up visit. Patient educated on when it is appropriate to go to the emergency department.   Mary-Margaret Gladis, FNP

## 2024-07-12 ENCOUNTER — Ambulatory Visit

## 2024-07-12 MED ORDER — ROMOSOZUMAB-AQQG 105 MG/1.17ML ~~LOC~~ SOSY: 210.0000 mg | PREFILLED_SYRINGE | Freq: Once | SUBCUTANEOUS | Status: DC

## 2024-07-13 ENCOUNTER — Ambulatory Visit

## 2024-07-13 VITALS — BP 157/88 | HR 77 | Temp 98.6°F | Resp 16 | Ht 63.0 in | Wt 155.2 lb

## 2024-07-13 DIAGNOSIS — Z8781 Personal history of (healed) traumatic fracture: Secondary | ICD-10-CM

## 2024-07-13 DIAGNOSIS — M818 Other osteoporosis without current pathological fracture: Secondary | ICD-10-CM | POA: Diagnosis not present

## 2024-07-13 MED ORDER — ROMOSOZUMAB-AQQG 105 MG/1.17ML ~~LOC~~ SOSY
210.0000 mg | PREFILLED_SYRINGE | Freq: Once | SUBCUTANEOUS | Status: AC
  Administered 2024-07-13: 210 mg via SUBCUTANEOUS

## 2024-07-13 NOTE — Progress Notes (Signed)
 Diagnosis: Osteoporosis  Provider:  Mannam, Praveen MD  Procedure: Injection  Evenity  (Romosozumab -aqqg), Dose: 210 mg, Site: subcutaneous, Number of injections: 2  Injection Site(s): Left lower quad. abdomen and Right lower quad. abdomne  Post Care: Patient declined observation  Discharge: Condition: Good, Destination: Home . AVS Declined  Performed by:  Star East, LPN

## 2024-07-15 NOTE — Progress Notes (Deleted)
 Office Visit Note  Patient: Janet Randolph             Date of Birth: April 27, 1961           MRN: [id number]             PCP: Gladis Mustard, FNP Referring: Gladis Mustard, * Visit Date: 07/28/2024 Occupation: Data Unavailable  Subjective:  No chief complaint on file.   History of Present Illness: Janet Randolph is a 63 y.o. female ***     Activities of Daily Living:  Patient reports morning stiffness for *** {minute/hour:19697}.   Patient {ACTIONS;DENIES/REPORTS:21021675::Denies} nocturnal pain.  Difficulty dressing/grooming: {ACTIONS;DENIES/REPORTS:21021675::Denies} Difficulty climbing stairs: {ACTIONS;DENIES/REPORTS:21021675::Denies} Difficulty getting out of chair: {ACTIONS;DENIES/REPORTS:21021675::Denies} Difficulty using hands for taps, buttons, cutlery, and/or writing: {ACTIONS;DENIES/REPORTS:21021675::Denies}  No Rheumatology ROS completed.   PMFS History:  Patient Active Problem List   Diagnosis Date Noted   Acute CVA (cerebrovascular accident) (HCC) 07/02/2024   Age-related osteoporosis without fracture 12/20/2022   History of wrist fracture 12/20/2022   GERD (gastroesophageal reflux disease) 04/03/2016   Eczema of both hands 07/12/2015   GAD (generalized anxiety disorder) 06/04/2013   Hyperlipidemia 03/05/2013   Hypertension 03/05/2013   Depression 02/09/2013    Past Medical History:  Diagnosis Date   Colitis    Depression    Eczema    Hyperlipidemia    Hypertension    Spinal stenosis 2024   Vitamin D  deficiency     Family History  Problem Relation Age of Onset   Melanoma Mother    Asthma Father    Crohn's disease Father    Melanoma Father    Asthma Brother    Heart Problems Brother    Stroke Brother    Healthy Son    Healthy Daughter    Past Surgical History:  Procedure Laterality Date   HAND SURGERY Bilateral    MOLE REMOVAL  06/12/2022   TEE WITHOUT CARDIOVERSION N/A 01/02/2022   Procedure: TRANSESOPHAGEAL  ECHOCARDIOGRAM (TEE);  Surgeon: Pietro Redell RAMAN, MD;  Location: [facility] ENDOSCOPY;  Service: Cardiovascular;  Laterality: N/A;   Social History   Tobacco Use   Smoking status: Former    Current packs/day: 0.00    Types: Cigarettes    Quit date: 06/10/2023    Years since quitting: 1.0    Passive exposure: Never   Smokeless tobacco: Never  Vaping Use   Vaping status: Some Days  Substance Use Topics   Alcohol use: Yes    Comment: occ   Drug use: No   Social History   Social History Narrative   Not on file     Immunization History  Administered Date(s) Administered   PFIZER(Purple Top)SARS-COV-2 Vaccination 11/25/2019, 12/20/2019, 08/29/2020   Tdap 07/23/2010, 07/20/2021   Zoster Recombinant(Shingrix ) 09/28/2021, 09/30/2022     Objective: Vital Signs: There were no vitals taken for this visit.   Physical Exam   Musculoskeletal Exam: ***  CDAI Exam: CDAI Score: -- Patient Global: --; Provider Global: -- Swollen: --; Tender: -- Joint Exam 07/28/2024   No joint exam has been documented for this visit   There is currently no information documented on the homunculus. Go to the Rheumatology activity and complete the homunculus joint exam.  Investigation: No additional findings.  Imaging: ECHOCARDIOGRAM COMPLETE Result Date: 07/02/2024    ECHOCARDIOGRAM REPORT   Patient Name:   Janet Randolph Date of Exam: 07/02/2024 Medical Rec #:  [id number]         Height:  63.0 in Accession #:    [id number]        Weight:       151.0 lb Date of Birth:  1960-11-23         BSA:          1.716 m Patient Age:    63 years          BP:           153/93 mmHg Patient Gender: F                 HR:           79 bpm. Exam Location:  Inpatient Procedure: 2D Echo, Cardiac Doppler and Color Doppler (Both Spectral and Color            Flow Doppler were utilized during procedure). Indications:    Stroke I63.9  History:        Patient has prior history of Echocardiogram examinations, most                  recent 10/09/2021. Stroke; Risk Factors:Hypertension and                 Dyslipidemia.  Sonographer:    Thea Norlander RCS Referring Phys: CAROLE N HALL IMPRESSIONS  1. Left ventricular ejection fraction, by estimation, is 60 to 65%. The left ventricle has normal function. The left ventricle has no regional wall motion abnormalities. Left ventricular diastolic parameters are consistent with Grade I diastolic dysfunction (impaired relaxation).  2. Right ventricular systolic function is normal. The right ventricular size is normal.  3. The mitral valve is normal in structure. Trivial mitral valve regurgitation. No evidence of mitral stenosis.  4. The aortic valve is tricuspid. There is mild calcification of the aortic valve. Aortic valve regurgitation is not visualized. Aortic valve sclerosis/calcification is present, without any evidence of aortic stenosis.  5. The inferior vena cava is normal in size with greater than 50% respiratory variability, suggesting right atrial pressure of 3 mmHg. FINDINGS  Left Ventricle: Left ventricular ejection fraction, by estimation, is 60 to 65%. The left ventricle has normal function. The left ventricle has no regional wall motion abnormalities. The left ventricular internal cavity size was normal in size. There is  no left ventricular hypertrophy. Left ventricular diastolic parameters are consistent with Grade I diastolic dysfunction (impaired relaxation). Right Ventricle: The right ventricular size is normal. No increase in right ventricular wall thickness. Right ventricular systolic function is normal. Left Atrium: Left atrial size was normal in size. Right Atrium: Right atrial size was normal in size. Pericardium: There is no evidence of pericardial effusion. Mitral Valve: The mitral valve is normal in structure. Trivial mitral valve regurgitation. No evidence of mitral valve stenosis. Tricuspid Valve: The tricuspid valve is normal in structure. Tricuspid valve  regurgitation is trivial. No evidence of tricuspid stenosis. Aortic Valve: The aortic valve is tricuspid. There is mild calcification of the aortic valve. Aortic valve regurgitation is not visualized. Aortic valve sclerosis/calcification is present, without any evidence of aortic stenosis. Aortic valve peak gradient measures 18.5 mmHg. Pulmonic Valve: The pulmonic valve was normal in structure. Pulmonic valve regurgitation is not visualized. No evidence of pulmonic stenosis. Aorta: The aortic root is normal in size and structure. Venous: The inferior vena cava is normal in size with greater than 50% respiratory variability, suggesting right atrial pressure of 3 mmHg. IAS/Shunts: No atrial level shunt detected by color flow Doppler.  LEFT VENTRICLE PLAX 2D LVIDd:  4.80 cm   Diastology LVIDs:         3.10 cm   LV e' medial:    9.14 cm/s LV PW:         0.90 cm   LV E/e' medial:  10.0 LV IVS:        1.00 cm   LV e' lateral:   10.30 cm/s LVOT diam:     2.00 cm   LV E/e' lateral: 8.9 LV SV:         89 LV SV Index:   52 LVOT Area:     3.14 cm LV IVRT:       67 msec  RIGHT VENTRICLE             IVC RV S prime:     10.30 cm/s  IVC diam: 2.00 cm TAPSE (M-mode): 2.1 cm LEFT ATRIUM             Index        RIGHT ATRIUM           Index LA diam:        3.60 cm 2.10 cm/m   RA Area:     11.20 cm LA Vol (A2C):   31.2 ml 18.18 ml/m  RA Volume:   25.30 ml  14.74 ml/m LA Vol (A4C):   39.4 ml 22.96 ml/m LA Biplane Vol: 37.7 ml 21.97 ml/m  AORTIC VALVE AV Area (Vmax): 2.35 cm AV Vmax:        215.00 cm/s AV Peak Grad:   18.5 mmHg LVOT Vmax:      161.00 cm/s LVOT Vmean:     96.300 cm/s LVOT VTI:       0.284 m  AORTA Ao Root diam: 2.90 cm Ao Asc diam:  3.20 cm MITRAL VALVE MV Area (PHT): 3.85 cm     SHUNTS MV Decel Time: 197 msec     Systemic VTI:  0.28 m MV E velocity: 91.70 cm/s   Systemic Diam: 2.00 cm MV A velocity: 116.00 cm/s MV E/A ratio:  0.79 Toribio Fuel MD Electronically signed by Toribio Fuel MD Signature  Date/Time: 07/02/2024/5:32:57 PM    Final    MR Brain W and Wo Contrast Result Date: 07/02/2024 EXAM: MRI BRAIN WITH AND WITHOUT CONTRAST 07/02/2024 01:11:00 AM TECHNIQUE: Multiplanar multisequence MRI of the head/brain was performed with and without the administration of intravenous contrast. COMPARISON: CTA head/neck as of 07/01/2024. CLINICAL HISTORY: Headache, increasing frequency or severity; Neuro deficit, acute, stroke suspected. FINDINGS: BRAIN AND VENTRICLES: Acute infarcts in the right frontal white matter and right insula. Moderate T2 hyperintensities in the white matter, compatible with chronic microvascular ischemic change. Remote left cerebelalr infarct. No acute intracranial hemorrhage. No mass effect or midline shift. No hydrocephalus. Normal flow voids. No mass or abnormal enhancement. ORBITS: No acute abnormality. SINUSES: No acute abnormality. BONES AND SOFT TISSUES: Normal bone marrow signal and enhancement. No acute soft tissue abnormality. IMPRESSION: 1. Acute infarcts in the right frontal white matter and right insula. Electronically signed by: Gilmore Molt MD 07/02/2024 01:28 AM EDT RP Workstation: [id number]   MR MRV HEAD W WO CONTRAST Result Date: 07/02/2024 EXAM: MRV BRAIN 07/02/2024 01:08:00 AM TECHNIQUE: Multiplanar multisequence MRV of the head was performed without and with the administration of 7mL gadobutrol (GADAVIST) 1 MMOL/ML injection 7 mL GADOBUTROL 1 MMOL/ML IV SOLN intravenous contrast. Multiplanar 2D and 3D reformatted images are provided for review. COMPARISON: None available. CLINICAL HISTORY: Dural venous sinus thrombosis suspected. FINDINGS: No focal  stenosis or thrombus is seen of the major dural venous sinuses. IMPRESSION: 1. No evidence of dural venous sinus thrombosis. Electronically signed by: Gilmore Molt MD 07/02/2024 01:24 AM EDT RP Workstation: [id number]   CT ANGIO HEAD NECK W WO CM Result Date: 07/01/2024 EXAM: CTA HEAD AND NECK WITHOUT AND  WITH 07/01/2024 10:38:26 PM TECHNIQUE: CTA of the head and neck was performed without and with the administration of intravenous contrast. Multiplanar 2D and/or 3D reformatted images are provided for review. Automated exposure control, iterative reconstruction, and/or weight based adjustment of the mA/kV was utilized to reduce the radiation dose to as low as reasonably achievable. Stenosis of the internal carotid arteries measured using NASCET criteria. COMPARISON: None available CLINICAL HISTORY: Neuro deficit, acute, stroke suspected. Son concerned for asymmetrical smile when face is relaxed. FINDINGS: AORTIC ARCH AND ARCH VESSELS: No dissection or arterial injury. No significant stenosis of the brachiocephalic or subclavian arteries. CERVICAL CAROTID ARTERIES: No dissection, arterial injury, or hemodynamically significant stenosis by NASCET criteria. Mild irregularity of the ICAs bilaterally at the skull base. CERVICAL VERTEBRAL ARTERIES: No dissection, arterial injury, or significant stenosis. LUNGS AND MEDIASTINUM: Unremarkable. SOFT TISSUES: Previously biopsied thyroid  nodules that were further characterized on ultrasound from 05/14/23. BONES: No acute abnormality. ANTERIOR CIRCULATION: No significant stenosis of the internal carotid arteries. No significant stenosis of the anterior cerebral arteries. No significant stenosis of the middle cerebral arteries. No aneurysm. POSTERIOR CIRCULATION: No significant stenosis of the posterior cerebral arteries. No significant stenosis of the basilar artery. No significant stenosis of the vertebral arteries. No aneurysm. OTHER: No dural venous sinus thrombosis on this non-dedicated study. IMPRESSION: 1. No large vessel occlusion or proximal hemodynamically significant stenosis. 2. Mild irregularity of the ICAs bilaterally at the skull base, which can be seen with fibromuscular dysplasia. Electronically signed by: Gilmore Molt MD 07/01/2024 11:00 PM EDT RP Workstation:  [id number]    Recent Labs: Lab Results  Component Value Date   WBC 5.8 07/02/2024   HGB 10.2 (L) 07/02/2024   PLT 331 07/02/2024   NA 139 07/01/2024   K 3.8 07/01/2024   CL 104 07/01/2024   CO2 24 07/01/2024   GLUCOSE 105 (H) 07/01/2024   BUN 21 07/01/2024   CREATININE 0.82 07/02/2024   BILITOT <0.2 04/06/2024   ALKPHOS 105 04/06/2024   AST 16 04/06/2024   ALT 14 04/06/2024   PROT 6.0 04/06/2024   ALBUMIN 3.6 (L) 04/06/2024   CALCIUM  9.4 07/01/2024   GFRAA 112 02/22/2020    Speciality Comments: Forteo  05/23-02/24-took 50% of the time  Procedures:  No procedures performed Allergies: Latex and Penicillins   Assessment / Plan:     Visit Diagnoses: No diagnosis found.  Orders: No orders of the defined types were placed in this encounter.  No orders of the defined types were placed in this encounter.   Face-to-face time spent with patient was *** minutes. Greater than 50% of time was spent in counseling and coordination of care.  Follow-Up Instructions: No follow-ups on file.   Daved JAYSON Gavel, CMA  Note - This record has been created using Animal nutritionist.  Chart creation errors have been sought, but may not always  have been located. Such creation errors do not reflect on  the standard of medical care.

## 2024-07-28 ENCOUNTER — Ambulatory Visit: Admitting: Rheumatology

## 2024-07-28 DIAGNOSIS — Z5181 Encounter for therapeutic drug level monitoring: Secondary | ICD-10-CM

## 2024-07-28 DIAGNOSIS — E559 Vitamin D deficiency, unspecified: Secondary | ICD-10-CM

## 2024-07-28 DIAGNOSIS — M4004 Postural kyphosis, thoracic region: Secondary | ICD-10-CM

## 2024-07-28 DIAGNOSIS — Z8719 Personal history of other diseases of the digestive system: Secondary | ICD-10-CM

## 2024-07-28 DIAGNOSIS — M19041 Primary osteoarthritis, right hand: Secondary | ICD-10-CM

## 2024-07-28 DIAGNOSIS — E782 Mixed hyperlipidemia: Secondary | ICD-10-CM

## 2024-07-28 DIAGNOSIS — M47816 Spondylosis without myelopathy or radiculopathy, lumbar region: Secondary | ICD-10-CM

## 2024-07-28 DIAGNOSIS — J439 Emphysema, unspecified: Secondary | ICD-10-CM

## 2024-07-28 DIAGNOSIS — L309 Dermatitis, unspecified: Secondary | ICD-10-CM

## 2024-07-28 DIAGNOSIS — E041 Nontoxic single thyroid nodule: Secondary | ICD-10-CM

## 2024-07-28 DIAGNOSIS — M818 Other osteoporosis without current pathological fracture: Secondary | ICD-10-CM

## 2024-07-28 DIAGNOSIS — Z8781 Personal history of (healed) traumatic fracture: Secondary | ICD-10-CM

## 2024-07-28 DIAGNOSIS — F32A Depression, unspecified: Secondary | ICD-10-CM

## 2024-07-28 DIAGNOSIS — Z87891 Personal history of nicotine dependence: Secondary | ICD-10-CM

## 2024-07-28 DIAGNOSIS — I10 Essential (primary) hypertension: Secondary | ICD-10-CM

## 2024-07-28 DIAGNOSIS — R011 Cardiac murmur, unspecified: Secondary | ICD-10-CM

## 2024-08-11 ENCOUNTER — Ambulatory Visit

## 2024-08-11 MED ORDER — ROMOSOZUMAB-AQQG 105 MG/1.17ML ~~LOC~~ SOSY: 210.0000 mg | PREFILLED_SYRINGE | Freq: Once | SUBCUTANEOUS | Status: DC

## 2024-08-12 ENCOUNTER — Ambulatory Visit: Attending: Physician Assistant

## 2024-08-12 ENCOUNTER — Encounter: Payer: Self-pay | Admitting: Rheumatology

## 2024-08-12 DIAGNOSIS — I639 Cerebral infarction, unspecified: Secondary | ICD-10-CM

## 2024-08-15 DIAGNOSIS — I639 Cerebral infarction, unspecified: Secondary | ICD-10-CM

## 2024-08-16 ENCOUNTER — Ambulatory Visit

## 2024-08-16 MED ORDER — ROMOSOZUMAB-AQQG 105 MG/1.17ML ~~LOC~~ SOSY: 210.0000 mg | PREFILLED_SYRINGE | Freq: Once | SUBCUTANEOUS | Status: DC

## 2024-08-16 NOTE — Progress Notes (Unsigned)
 Office Visit Note  Patient: Janet Randolph             Date of Birth: [birth year]           MRN: [id number]             PCP: Gladis Mustard, FNP Referring: Gladis Mustard, * Visit Date: 08/30/2024 Occupation: Data Unavailable  Subjective:  No chief complaint on file.   History of Present Illness: Janet Randolph is a 63 y.o. female ***     Activities of Daily Living:  Patient reports morning stiffness for *** {minute/hour:19697}.   Patient {ACTIONS;DENIES/REPORTS:21021675::Denies} nocturnal pain.  Difficulty dressing/grooming: {ACTIONS;DENIES/REPORTS:21021675::Denies} Difficulty climbing stairs: {ACTIONS;DENIES/REPORTS:21021675::Denies} Difficulty getting out of chair: {ACTIONS;DENIES/REPORTS:21021675::Denies} Difficulty using hands for taps, buttons, cutlery, and/or writing: {ACTIONS;DENIES/REPORTS:21021675::Denies}  No Rheumatology ROS completed.   PMFS History:  Patient Active Problem List   Diagnosis Date Noted   Acute CVA (cerebrovascular accident) (HCC) 07/02/2024   Age-related osteoporosis without fracture 12/20/2022   History of wrist fracture 12/20/2022   GERD (gastroesophageal reflux disease) 04/03/2016   Eczema of both hands 07/12/2015   GAD (generalized anxiety disorder) 06/04/2013   Hyperlipidemia 03/05/2013   Hypertension 03/05/2013   Depression 02/09/2013    Past Medical History:  Diagnosis Date   Colitis    Depression    Eczema    Hyperlipidemia    Hypertension    Spinal stenosis 2024   Vitamin D  deficiency     Family History  Problem Relation Age of Onset   Melanoma Mother    Asthma Father    Crohn's disease Father    Melanoma Father    Asthma Brother    Heart Problems Brother    Stroke Brother    Healthy Son    Healthy Daughter    Past Surgical History:  Procedure Laterality Date   HAND SURGERY Bilateral    MOLE REMOVAL  06/12/2022   TEE WITHOUT CARDIOVERSION N/A 01/02/2022   Procedure: TRANSESOPHAGEAL  ECHOCARDIOGRAM (TEE);  Surgeon: Pietro Redell RAMAN, MD;  Location: [facility] ENDOSCOPY;  Service: Cardiovascular;  Laterality: N/A;   Social History   Tobacco Use   Smoking status: Former    Current packs/day: 0.00    Types: Cigarettes    Quit date: 06/10/2023    Years since quitting: 1.1    Passive exposure: Never   Smokeless tobacco: Never  Vaping Use   Vaping status: Some Days  Substance Use Topics   Alcohol use: Yes    Comment: occ   Drug use: No   Social History   Social History Narrative   Not on file     Immunization History  Administered Date(s) Administered   PFIZER(Purple Top)SARS-COV-2 Vaccination 11/25/2019, 12/20/2019, 08/29/2020   Tdap 07/23/2010, 07/20/2021   Zoster Recombinant(Shingrix ) 09/28/2021, 09/30/2022     Objective: Vital Signs: There were no vitals taken for this visit.   Physical Exam   Musculoskeletal Exam: ***  CDAI Exam: CDAI Score: -- Patient Global: --; Provider Global: -- Swollen: --; Tender: -- Joint Exam 08/30/2024   No joint exam has been documented for this visit   There is currently no information documented on the homunculus. Go to the Rheumatology activity and complete the homunculus joint exam.  Investigation: No additional findings.  Imaging: Cardiac event monitor Result Date: 08/15/2024 Rhythm was normal sinus Rare atrial ectopy with rare brief runs of SVT No sustained arrhythmias   Recent Labs: Lab Results  Component Value Date   WBC 5.8 07/02/2024   HGB 10.2 (L) 07/02/2024  PLT 331 07/02/2024   NA 139 07/01/2024   K 3.8 07/01/2024   CL 104 07/01/2024   CO2 24 07/01/2024   GLUCOSE 105 (H) 07/01/2024   BUN 21 07/01/2024   CREATININE 0.82 07/02/2024   BILITOT <0.2 04/06/2024   ALKPHOS 105 04/06/2024   AST 16 04/06/2024   ALT 14 04/06/2024   PROT 6.0 04/06/2024   ALBUMIN 3.6 (L) 04/06/2024   CALCIUM  9.4 07/01/2024   GFRAA 112 02/22/2020    Speciality Comments: Forteo  05/23-02/24-took 50% of the  time  Procedures:  No procedures performed Allergies: Latex and Penicillins   Assessment / Plan:     Visit Diagnoses: No diagnosis found.  Orders: No orders of the defined types were placed in this encounter.  No orders of the defined types were placed in this encounter.   Face-to-face time spent with patient was *** minutes. Greater than 50% of time was spent in counseling and coordination of care.  Follow-Up Instructions: No follow-ups on file.   Daved JAYSON Gavel, CMA  Note - This record has been created using Animal nutritionist.  Chart creation errors have been sought, but may not always  have been located. Such creation errors do not reflect on  the standard of medical care.

## 2024-08-17 ENCOUNTER — Encounter: Payer: Self-pay | Admitting: Rheumatology

## 2024-08-18 ENCOUNTER — Ambulatory Visit

## 2024-08-18 VITALS — BP 148/77 | HR 79 | Temp 97.7°F | Resp 20 | Ht 63.0 in | Wt 156.0 lb

## 2024-08-18 DIAGNOSIS — M818 Other osteoporosis without current pathological fracture: Secondary | ICD-10-CM

## 2024-08-18 DIAGNOSIS — Z8781 Personal history of (healed) traumatic fracture: Secondary | ICD-10-CM | POA: Diagnosis not present

## 2024-08-18 MED ORDER — ROMOSOZUMAB-AQQG 105 MG/1.17ML ~~LOC~~ SOSY
210.0000 mg | PREFILLED_SYRINGE | Freq: Once | SUBCUTANEOUS | Status: AC
  Administered 2024-08-18: 210 mg via SUBCUTANEOUS
  Filled 2024-08-18: qty 2.34

## 2024-08-18 NOTE — Progress Notes (Signed)
 Diagnosis: Osteoporosis  Provider:  Mannam, Praveen MD  Procedure: Injection  Evenity  (Romosozumab -aqqg), Dose: 210 mg, Site: subcutaneous, Number of injections: 2  Injection Site(s): Left upper quad. abdomen and Right upper quad. abdomen  Post Care: Injections  Discharge: Condition: Good, Destination: Home . AVS Declined  Performed by:  Donny Childes, RN

## 2024-08-20 ENCOUNTER — Ambulatory Visit: Payer: Self-pay | Admitting: Physician Assistant

## 2024-08-23 NOTE — Progress Notes (Unsigned)
 Cardiology Office Note   Date:  08/24/2024   ID:  [name], [name] Nov 01, 1960, MRN [id number]  PCP:  Gladis Mustard, FNP  Cardiologist:   None Referring:  Gladis Mustard, FNP  Chief Complaint  Patient presents with   CVA      History of Present Illness: Janet Randolph is a 63 y.o. 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    CTA had mild fibromuscular dysplasia.    EF was 60 - 65%.  She was treated with ASA and Plavix .   Four week monitor demonstrated no atrial fib.   She was referred by Gladis Mustard, FNP  The patient reports that she has a history of headaches.  She thought she was having a migraine but her son noticed facial droop and drove her to the emergency room.  She was discharged eventually on aspirin  and Plavix  and currently is on aspirin  alone.  She said she has had no residual and her symptoms resolved.  She never had any prior symptoms.  She lives alone.  She does all of her chores.  She was packing in lifting in her house without problems recently.  She does have some mild chronic dyspnea that has been slowly progressive.  However, previous chest x-ray did demonstrate some mild emphysematous changes.  She smoked for 25 years but quit about a year ago.  She does not report palpitations.  She does not have chest pressure, neck or arm discomfort.  She does not have any presyncope or syncope.    Of note she does report that she has snoring and her daughter reports apnea.  She has routine headaches.   Past Medical History:  Diagnosis Date   Colitis    Depression    Eczema    Hyperlipidemia    Hypertension    Spinal stenosis 2024   Vitamin D  deficiency     Past Surgical History:  Procedure Laterality Date   HAND SURGERY Bilateral    MOLE REMOVAL  06/12/2022   TEE WITHOUT CARDIOVERSION N/A 01/02/2022   Procedure: TRANSESOPHAGEAL ECHOCARDIOGRAM (TEE);  Surgeon: Pietro Redell RAMAN, MD;  Location: [facility] ENDOSCOPY;  Service: Cardiovascular;  Laterality: N/A;     Current Outpatient Medications  Medication Sig Dispense Refill   ALPRAZolam  (XANAX ) 0.5 MG tablet Take 1 tablet (0.5 mg total) by mouth 2 (two) times daily as needed. (Patient taking differently: Take 0.5 mg by mouth 2 (two) times daily as needed for anxiety.) 60 tablet 5   amLODipine  (NORVASC ) 2.5 MG tablet Take 1 tablet (2.5 mg total) by mouth daily. 90 tablet 3   aspirin  EC 81 MG tablet Take 1 tablet (81 mg total) by mouth daily. Swallow whole. 30 tablet 12   butalbital -acetaminophen -caffeine  (FIORICET ) 50-325-40 MG tablet Take 1 tablet by mouth every 4 (four) hours as needed for migraine. 14 tablet 0   clopidogrel  (PLAVIX ) 75 MG tablet Take 1 tablet (75 mg total) by mouth daily. 21 tablet 0   esomeprazole  (NEXIUM ) 40 MG capsule TAKE 1 CAPSULE (40 MG TOTAL) BY MOUTH DAILY. TAKE 30 MIN BEFORE MEAL 90 capsule 1   lisinopril  (ZESTRIL ) 40 MG tablet Take 1 tablet (40 mg total) by mouth daily. 90 tablet 1   rosuvastatin  (CRESTOR ) 20 MG tablet Take 1 tablet (20 mg total) by mouth daily. 90 tablet 3   venlafaxine  XR (EFFEXOR -XR) 75 MG 24 hr capsule Take 2 capsules (150 mg total) by mouth daily with  breakfast. (Patient taking differently: Take 75 mg by mouth daily with breakfast.) 180 capsule 1   No current facility-administered medications for this visit.    Allergies:   Latex and Penicillins    Social History:  The patient  reports that she quit smoking about 14 months ago. Her smoking use included cigarettes. She has never been exposed to tobacco smoke. She has never used smokeless tobacco. She reports current alcohol use. She reports that she does not use drugs.   Family History:  The patient's family history includes Asthma in her brother and father; Crohn's disease in her father; Healthy in her daughter and son; Heart Problems in her brother; Melanoma in her father and mother; Stroke in her brother.     ROS:  Please see the history of present illness.   Otherwise, review of systems are positive for none.   All other systems are reviewed and negative.    PHYSICAL EXAM: VS:  BP (!) 147/87 (BP Location: Left Arm)   Pulse 73   Ht 5' 3 (1.6 m)   Wt 155 lb (70.3 kg)   SpO2 95%   BMI 27.46 kg/m  , BMI Body mass index is 27.46 kg/m. GENERAL:  Well appearing HEENT:  Pupils equal round and reactive, fundi not visualized, oral mucosa unremarkable NECK:  No jugular venous distention, waveform within normal limits, carotid upstroke brisk and symmetric, no bruits, no thyromegaly LYMPHATICS:  No cervical, inguinal adenopathy LUNGS:  Clear to auscultation bilaterally BACK:  No CVA tenderness CHEST:  Unremarkable HEART:  PMI not displaced or sustained,S1 and S2 within normal limits, no S3, no S4, no clicks, no rubs, no murmurs ABD:  Flat, positive bowel sounds normal in frequency in pitch, no bruits, no rebound, no guarding, no midline pulsatile mass, no hepatomegaly, no splenomegaly EXT:  2 plus pulses throughout, no edema, no cyanosis no clubbing SKIN:  No rashes no nodules NEURO:  Cranial nerves II through XII grossly intact, motor grossly intact throughout PSYCH:  Cognitively intact, oriented to person place and time    EKG:    Sinus rhythm, rate 77, right bundle branch block, no acute ST-T wave changes.    Recent Labs: 04/06/2024: ALT 14; TSH 1.960 07/01/2024: BUN 21; Potassium 3.8; Sodium 139 07/02/2024: Creatinine, Ser 0.82; Hemoglobin 10.2; Platelets 331    Lipid Panel    Component Value Date/Time   CHOL 140 07/02/2024 0610   CHOL 135 04/06/2024 1040   TRIG 61 07/02/2024 0610   TRIG 105 07/19/2014 1506   HDL 57 07/02/2024 0610   HDL 49 04/06/2024 1040   HDL 51 07/19/2014 1506   CHOLHDL 2.5 07/02/2024 0610   VLDL 12 07/02/2024 0610   LDLCALC 71 07/02/2024 0610   LDLCALC 69 04/06/2024 1040   LDLCALC 114 (H) 05/18/2014 1753      Wt Readings from Last 3 Encounters:   08/24/24 155 lb (70.3 kg)  08/18/24 156 lb (70.8 kg)  07/13/24 155 lb 3.2 oz (70.4 kg)      Other studies Reviewed: Additional studies/ records that were reviewed today include: Hospital records. Review of the above records demonstrates:  Please see elsewhere in the note.     ASSESSMENT AND PLAN:  Hypertension: Her blood pressure is mildly elevated.  I am going to add amlodipine  2.5 mg daily.   Hyperlipidemia: I would like her LDL to be in the 50s.  It was 71.  And then it change her back to Crestor  which she tolerated previously.  She  was on 10 mg and going to increase to 20 mg.  She did not tolerate Lipitor she developed muscle aches.   AKI :  This was thought to be related to dehydraion.  This returned to baseline.  Snoring: STOP-BANG is 5.  She has apnea, snoring, headaches.  I am going to order home sleep study.  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  I am going to refer her for a loop monitor.  I think the possibility of paroxysmal atrial fibrillation is high.  Current medicines are reviewed at length with the patient today.  The patient does not have concerns regarding medicines.  The following changes have been made:  no change  Labs/ tests ordered today include:   Orders Placed This Encounter  Procedures   Ambulatory referral to Cardiac Electrophysiology   [facility] Study     Disposition:   FU with me in 3 months in [place].     Signed, Lynwood Schilling, MD  08/24/2024 5:14 PM    [facility]

## 2024-08-24 ENCOUNTER — Encounter: Payer: Self-pay | Admitting: Cardiology

## 2024-08-24 ENCOUNTER — Ambulatory Visit: Attending: Cardiology | Admitting: Cardiology

## 2024-08-24 VITALS — BP 147/87 | HR 73 | Ht 63.0 in | Wt 155.0 lb

## 2024-08-24 DIAGNOSIS — I639 Cerebral infarction, unspecified: Secondary | ICD-10-CM

## 2024-08-24 MED ORDER — ROSUVASTATIN CALCIUM 20 MG PO TABS: 20.0000 mg | ORAL_TABLET | Freq: Every day | ORAL | 3 refills | Status: DC

## 2024-08-24 MED ORDER — AMLODIPINE BESYLATE 2.5 MG PO TABS: 2.5000 mg | ORAL_TABLET | Freq: Every day | ORAL | 3 refills | Status: DC

## 2024-08-24 NOTE — Patient Instructions (Signed)
 Medication Instructions:  Stop Lipitor Start Crestor  20 mg once daily Start Amlodipine  2.5 mg once daily *If you need a refill on your cardiac medications before your next appointment, please call your pharmacy*  Lab Work: NONE If you have labs (blood work) drawn today and your tests are completely normal, you will receive your results only by: MyChart Message (if you have MyChart) OR A paper copy in the mail If you have any lab test that is abnormal or we need to change your treatment, we will call you to review the results.  Testing/Procedures: [facility] Study - the company will reach out to you regarding this test  Follow-Up: At [facility], you and your health needs are our priority.  As part of our continuing mission to provide you with exceptional heart care, our providers are all part of one team.  This team includes your primary Cardiologist (physician) and Advanced Practice Providers or APPs (Physician Assistants and Nurse Practitioners) who all work together to provide you with the care you need, when you need it.  Your next appointment:   3 month(s) in [place]  Provider:   [place], MD  We recommend signing up for the patient portal called MyChart.  Sign up information is provided on this After Visit Summary.  MyChart is used to connect with patients for Virtual Visits (Telemedicine).  Patients are able to view lab/test results, encounter notes, upcoming appointments, etc.  Non-urgent messages can be sent to your provider as well.   To learn more about what you can do with MyChart, go to [email or web address].   Other Instructions You have been referred to Electrophysiology for a loop recorder

## 2024-08-27 ENCOUNTER — Other Ambulatory Visit: Payer: Self-pay | Admitting: Nurse Practitioner

## 2024-08-27 DIAGNOSIS — K219 Gastro-esophageal reflux disease without esophagitis: Secondary | ICD-10-CM

## 2024-08-30 ENCOUNTER — Ambulatory Visit: Attending: Rheumatology | Admitting: Rheumatology

## 2024-08-30 ENCOUNTER — Encounter: Payer: Self-pay | Admitting: Rheumatology

## 2024-08-30 ENCOUNTER — Other Ambulatory Visit: Payer: Self-pay | Admitting: Pharmacist

## 2024-08-30 VITALS — BP 139/85 | HR 78 | Temp 97.9°F | Resp 16 | Ht 63.0 in | Wt 154.2 lb

## 2024-08-30 DIAGNOSIS — M19041 Primary osteoarthritis, right hand: Secondary | ICD-10-CM | POA: Diagnosis not present

## 2024-08-30 DIAGNOSIS — E041 Nontoxic single thyroid nodule: Secondary | ICD-10-CM

## 2024-08-30 DIAGNOSIS — R011 Cardiac murmur, unspecified: Secondary | ICD-10-CM

## 2024-08-30 DIAGNOSIS — I10 Essential (primary) hypertension: Secondary | ICD-10-CM | POA: Diagnosis not present

## 2024-08-30 DIAGNOSIS — Z8781 Personal history of (healed) traumatic fracture: Secondary | ICD-10-CM

## 2024-08-30 DIAGNOSIS — Z87891 Personal history of nicotine dependence: Secondary | ICD-10-CM

## 2024-08-30 DIAGNOSIS — F419 Anxiety disorder, unspecified: Secondary | ICD-10-CM

## 2024-08-30 DIAGNOSIS — Z5181 Encounter for therapeutic drug level monitoring: Secondary | ICD-10-CM | POA: Diagnosis not present

## 2024-08-30 DIAGNOSIS — M4004 Postural kyphosis, thoracic region: Secondary | ICD-10-CM | POA: Diagnosis not present

## 2024-08-30 DIAGNOSIS — L309 Dermatitis, unspecified: Secondary | ICD-10-CM

## 2024-08-30 DIAGNOSIS — Z8673 Personal history of transient ischemic attack (TIA), and cerebral infarction without residual deficits: Secondary | ICD-10-CM

## 2024-08-30 DIAGNOSIS — M19042 Primary osteoarthritis, left hand: Secondary | ICD-10-CM

## 2024-08-30 DIAGNOSIS — E782 Mixed hyperlipidemia: Secondary | ICD-10-CM

## 2024-08-30 DIAGNOSIS — Z8719 Personal history of other diseases of the digestive system: Secondary | ICD-10-CM

## 2024-08-30 DIAGNOSIS — J439 Emphysema, unspecified: Secondary | ICD-10-CM

## 2024-08-30 DIAGNOSIS — M818 Other osteoporosis without current pathological fracture: Secondary | ICD-10-CM

## 2024-08-30 DIAGNOSIS — E559 Vitamin D deficiency, unspecified: Secondary | ICD-10-CM | POA: Diagnosis not present

## 2024-08-30 DIAGNOSIS — M47816 Spondylosis without myelopathy or radiculopathy, lumbar region: Secondary | ICD-10-CM

## 2024-08-30 DIAGNOSIS — F32A Depression, unspecified: Secondary | ICD-10-CM

## 2024-08-30 LAB — COMPREHENSIVE METABOLIC PANEL WITH GFR
AG Ratio: 1.6 (calc) (ref 1.0–2.5)
ALT: 14 U/L (ref 6–29)
AST: 19 U/L (ref 10–35)
Albumin: 4.2 g/dL (ref 3.6–5.1)
Alkaline phosphatase (APISO): 54 U/L (ref 37–153)
BUN: 21 mg/dL (ref 7–25)
CO2: 28 mmol/L (ref 20–32)
Calcium: 9.3 mg/dL (ref 8.6–10.4)
Chloride: 106 mmol/L (ref 98–110)
Creat: 0.74 mg/dL (ref 0.50–1.05)
Globulin: 2.7 g/dL (ref 1.9–3.7)
Glucose, Bld: 98 mg/dL (ref 65–99)
Potassium: 4.3 mmol/L (ref 3.5–5.3)
Sodium: 141 mmol/L (ref 135–146)
Total Bilirubin: 0.2 mg/dL (ref 0.2–1.2)
Total Protein: 6.9 g/dL (ref 6.1–8.1)
eGFR: 90 mL/min/1.73m2

## 2024-08-30 NOTE — Patient Instructions (Addendum)

## 2024-08-30 NOTE — Progress Notes (Signed)
 Referral placed for Zoledronic  acid (Reclast ) IV (G6510) at [facility] [phone]) [place] to start benefits investigation.  Evenity  will be stopped due to recent stroke. Dr. Dolphus will do labs today in the office. Thanks!   Diagnosis: osteoporosis  Provider: Dr. Maya Dolphus  Dose: 5mg  IV once yearly Premedications: acetaminophen  650mg  p.o. and diphenhydramine  25mg  p.o. Labs to be drawn with infusions: NONE  Last Evenity  on 08/18/2024 Last Clinic Visit: 08/30/2024 Next Clinic Visit: not yetr scheduled  Once benefits are processed, infusion center will contact patient to schedule.  Sherry Pennant, PharmD, MPH, BCPS, CPP Clinical Pharmacist

## 2024-08-31 ENCOUNTER — Telehealth: Payer: Self-pay | Admitting: Pharmacy Technician

## 2024-08-31 ENCOUNTER — Ambulatory Visit: Payer: Self-pay | Admitting: Rheumatology

## 2024-08-31 NOTE — Progress Notes (Signed)
CMP is normal.

## 2024-08-31 NOTE — Telephone Encounter (Signed)
 Auth Submission: NO AUTH NEEDED Site of care: Site of care: [facility] Payer: [employer] MEDICARE Medication & CPT/J Code(s) submitted: Reclast  (Zolendronic acid) [id number] Diagnosis Code:  Route of submission (phone, fax, portal):  Phone # Fax # Auth type: Buy/Bill PB Units/visits requested: X1 Reference number:  Approval from: 08/31/24 to 10/09/24

## 2024-09-12 ENCOUNTER — Encounter (HOSPITAL_BASED_OUTPATIENT_CLINIC_OR_DEPARTMENT_OTHER): Payer: Self-pay | Admitting: Cardiology

## 2024-09-12 DIAGNOSIS — G4733 Obstructive sleep apnea (adult) (pediatric): Secondary | ICD-10-CM | POA: Diagnosis not present

## 2024-09-14 NOTE — Procedures (Signed)
" ° °  SLEEP STUDY REPORT Patient Information Study Date: 09/12/2024 Patient Name: Janet Randolph Patient ID: [id number] Birth Date: 09/21/1960 Age: 64 Gender: Female BMI: 27.3 (W=154 lb, H=5' 3'') Referring Physician: Lynwood Schilling, MD  TEST DESCRIPTION: Home sleep apnea testing was completed using the WatchPat, a Type 1 device, utilizing  peripheral arterial tonometry (PAT), chest movement, actigraphy, pulse oximetry, pulse rate, body position and snore.  AHI was calculated with apnea and hypopnea using valid sleep time as the denominator. RDI includes apneas,  hypopneas, and RERAs. The data acquired and the scoring of sleep and all associated events were performed in  accordance with the recommended standards and specifications as outlined in the AASM Manual for the Scoring of  Sleep and Associated Events 2.2.0 (2015).   FINDINGS:   1. Mild Obstructive Sleep Apnea with AHI 5.1/hr. Most events occurred in the supine position.  2. No Central Sleep Apnea with pAHIc 0/hr.   3. Oxygen desaturations as low as 86%.   4. Moderate to severe snoring was present. O2 sats were < 88% for 0.6 min.   5. Total sleep time was 6 hrs and 42 min.   6. 11.3% of total sleep time was spent in REM sleep.   7. sleep onset latency at 36 min.   8. REM sleep onset latency at 401 min.   9. Total awakenings were 11.  10. Arrhythmia detection: None  DIAGNOSIS: Mild Obstructive Sleep Apnea (G47.33)  RECOMMENDATIONS: 1. Clinical correlation of these findings is necessary. The decision to treat obstructive sleep apnea (OSA) is usually  based on the presence of apnea symptoms or the presence of associated medical conditions such as Hypertension,  Congestive Heart Failure, Atrial Fibrillation or Obesity. The most common symptoms of OSA are snoring, gasping for  breath while sleeping, daytime sleepiness and fatigue.  2. Initiating apnea therapy is recommended given the presence of symptoms and/or associated  conditions.  Recommend proceeding with one of the following:  a. Auto-CPAP therapy with a pressure range of 5-20cm H2O.  b. An oral appliance (OA) that can be obtained from certain dentists with expertise in sleep medicine. These are  primarily of use in non-obese patients with mild and moderate disease.  c. An ENT consultation which may be useful to look for specific causes of obstruction and possible treatment  options.  d. If patient is intolerant to PAP therapy, consider referral to ENT for evaluation for hypoglossal nerve stimulator.  3. Close follow-up is necessary to ensure success with CPAP or oral appliance therapy for maximum benefit . 4. A follow-up oximetry study on CPAP is recommended to assess the adequacy of therapy and determine the need  for supplemental oxygen or the potential need for Bi-level therapy. An arterial blood gas to determine the adequacy of  baseline ventilation and oxygenation should also be considered. 5. Healthy sleep recommendations include: adequate nightly sleep (normal 7-9 hrs/night), avoidance of caffeine  after  noon and alcohol near bedtime, and maintaining a sleep environment that is cool, dark and quiet. 6. Weight loss for overweight patients is recommended. Even modest amounts of weight loss can significantly  improve the severity of sleep apnea. 7. Snoring recommendations include: weight loss where appropriate, side sleeping, and avoidance of alcohol before  bed. 8. Operation of motor vehicle should be avoided when sleepy.  Signature: Wilbert Bihari, MD; [facility]; Diplomat, American Board of Sleep  Medicine Electronically Signed: 09/14/2024 9:48:49 AM "

## 2024-09-15 ENCOUNTER — Ambulatory Visit

## 2024-09-15 VITALS — BP 133/88 | HR 63 | Temp 97.4°F | Resp 14 | Ht 63.0 in | Wt 156.6 lb

## 2024-09-15 DIAGNOSIS — M818 Other osteoporosis without current pathological fracture: Secondary | ICD-10-CM

## 2024-09-15 DIAGNOSIS — Z8781 Personal history of (healed) traumatic fracture: Secondary | ICD-10-CM

## 2024-09-15 MED ORDER — ZOLEDRONIC ACID 5 MG/100ML IV SOLN
5.0000 mg | Freq: Once | INTRAVENOUS | Status: AC
  Administered 2024-09-15: 5 mg via INTRAVENOUS
  Filled 2024-09-15: qty 100

## 2024-09-15 MED ORDER — LORATADINE 10 MG PO TABS
10.0000 mg | ORAL_TABLET | Freq: Once | ORAL | Status: AC
  Administered 2024-09-15: 10 mg via ORAL
  Filled 2024-09-15: qty 1

## 2024-09-15 MED ORDER — DIPHENHYDRAMINE HCL 25 MG PO CAPS: 25.0000 mg | ORAL_CAPSULE | Freq: Once | ORAL | Status: DC

## 2024-09-15 MED ORDER — ACETAMINOPHEN 325 MG PO TABS
650.0000 mg | ORAL_TABLET | Freq: Once | ORAL | Status: AC
  Administered 2024-09-15: 650 mg via ORAL
  Filled 2024-09-15: qty 2

## 2024-09-15 NOTE — Progress Notes (Signed)
 Diagnosis: Osteoporosis  Provider:  Mannam, Praveen MD  Procedure: IV Infusion  IV Type: Peripheral, IV Location: L Antecubital  Reclast  (Zolendronic Acid), Dose: 5 mg  Infusion Start Time: 1441  Infusion Stop Time: 1511  Post Infusion IV Care: Observation period completed and Peripheral IV Discontinued  Discharge: Condition: Good, Destination: Home . AVS Declined  Performed by:  Rocky FORBES Search, RN

## 2024-09-20 ENCOUNTER — Ambulatory Visit

## 2024-09-28 ENCOUNTER — Telehealth: Payer: Self-pay | Admitting: *Deleted

## 2024-09-28 NOTE — Telephone Encounter (Signed)
-----   Message from Wilbert Bihari, MD sent at 09/14/2024  9:50 AM EST ----- Minimal OSA that occurs mainly during supine sleep.  No PAP therapy recommended.  Recommend avoid sleeping in the supine position.  Consider referral to ENT for snoring.

## 2024-09-28 NOTE — Telephone Encounter (Signed)
 Patient notified of result via her mychart and asked to call back with questions.

## 2024-09-29 ENCOUNTER — Telehealth: Payer: Self-pay

## 2024-09-29 NOTE — Telephone Encounter (Signed)
-----   Message from Lynwood Schilling, MD sent at 09/21/2024 11:40 AM EST ----- Please make sure she got this message.  If she wants to see ENT we can refer.  Thanks. Jake ----- Message ----- From: Shlomo Wilbert SAUNDERS, MD Sent: 09/14/2024   9:51 AM EST To: Lynwood Schilling, MD  Minimal OSA that occurs mainly during supine sleep.  No PAP therapy recommended.  Recommend avoid sleeping in the supine position.  Consider referral to ENT for snoring.

## 2024-09-29 NOTE — Telephone Encounter (Signed)
 See telephone call from 1/20

## 2024-10-01 ENCOUNTER — Telehealth: Payer: Self-pay

## 2024-10-01 DIAGNOSIS — G4733 Obstructive sleep apnea (adult) (pediatric): Secondary | ICD-10-CM

## 2024-10-01 NOTE — Telephone Encounter (Signed)
 Spoke with pt regarding her results. Pt would like a referral to an ENT. Referral placed. Pt verbalized understanding. All questions if any were answered.

## 2024-10-01 NOTE — Telephone Encounter (Signed)
-----   Message from Lynwood Schilling, MD sent at 09/21/2024 11:40 AM EST ----- Please make sure she got this message.  If she wants to see ENT we can refer.  Thanks. Jake ----- Message ----- From: Shlomo Wilbert SAUNDERS, MD Sent: 09/14/2024   9:51 AM EST To: Lynwood Schilling, MD  Minimal OSA that occurs mainly during supine sleep.  No PAP therapy recommended.  Recommend avoid sleeping in the supine position.  Consider referral to ENT for snoring.

## 2024-10-04 ENCOUNTER — Ambulatory Visit: Admitting: Nurse Practitioner

## 2024-10-05 ENCOUNTER — Ambulatory Visit: Payer: Self-pay | Admitting: Nurse Practitioner

## 2024-10-05 ENCOUNTER — Ambulatory Visit (INDEPENDENT_AMBULATORY_CARE_PROVIDER_SITE_OTHER): Payer: Self-pay | Admitting: Nurse Practitioner

## 2024-10-05 ENCOUNTER — Encounter: Payer: Self-pay | Admitting: Nurse Practitioner

## 2024-10-05 VITALS — BP 135/82 | HR 71 | Temp 97.9°F | Ht 63.0 in | Wt 159.0 lb

## 2024-10-05 DIAGNOSIS — R300 Dysuria: Secondary | ICD-10-CM | POA: Diagnosis not present

## 2024-10-05 DIAGNOSIS — Z8673 Personal history of transient ischemic attack (TIA), and cerebral infarction without residual deficits: Secondary | ICD-10-CM

## 2024-10-05 DIAGNOSIS — F3342 Major depressive disorder, recurrent, in full remission: Secondary | ICD-10-CM | POA: Diagnosis not present

## 2024-10-05 DIAGNOSIS — I10 Essential (primary) hypertension: Secondary | ICD-10-CM | POA: Diagnosis not present

## 2024-10-05 DIAGNOSIS — F411 Generalized anxiety disorder: Secondary | ICD-10-CM | POA: Diagnosis not present

## 2024-10-05 DIAGNOSIS — E782 Mixed hyperlipidemia: Secondary | ICD-10-CM

## 2024-10-05 DIAGNOSIS — K219 Gastro-esophageal reflux disease without esophagitis: Secondary | ICD-10-CM

## 2024-10-05 LAB — URINALYSIS, ROUTINE W REFLEX MICROSCOPIC
Bilirubin, UA: NEGATIVE
Glucose, UA: NEGATIVE
Ketones, UA: NEGATIVE
Nitrite, UA: NEGATIVE
Protein,UA: NEGATIVE
Specific Gravity, UA: 1.01 (ref 1.005–1.030)
Urobilinogen, Ur: 0.2 mg/dL (ref 0.2–1.0)
pH, UA: 6 (ref 5.0–7.5)

## 2024-10-05 LAB — MICROSCOPIC EXAMINATION
RBC, Urine: NONE SEEN /HPF (ref 0–2)
Renal Epithel, UA: NONE SEEN /HPF
Yeast, UA: NONE SEEN

## 2024-10-05 MED ORDER — AMLODIPINE BESYLATE 2.5 MG PO TABS: 2.5000 mg | ORAL_TABLET | Freq: Every day | ORAL | 1 refills | Status: AC

## 2024-10-05 MED ORDER — VENLAFAXINE HCL ER 75 MG PO CP24: 150.0000 mg | ORAL_CAPSULE | Freq: Every day | ORAL | 1 refills | Status: AC

## 2024-10-05 MED ORDER — LISINOPRIL 40 MG PO TABS: 40.0000 mg | ORAL_TABLET | Freq: Every day | ORAL | 1 refills | Status: AC

## 2024-10-05 MED ORDER — ESOMEPRAZOLE MAGNESIUM 40 MG PO CPDR: 40.0000 mg | DELAYED_RELEASE_CAPSULE | Freq: Two times a day (BID) | ORAL | 1 refills | Status: AC

## 2024-10-05 MED ORDER — ALPRAZOLAM 0.5 MG PO TABS: 0.5000 mg | ORAL_TABLET | Freq: Two times a day (BID) | ORAL | 5 refills | Status: AC | PRN

## 2024-10-05 MED ORDER — ROSUVASTATIN CALCIUM 20 MG PO TABS: 20.0000 mg | ORAL_TABLET | Freq: Every day | ORAL | 1 refills | Status: AC

## 2024-10-05 NOTE — Patient Instructions (Signed)

## 2024-10-05 NOTE — Progress Notes (Signed)
 "  Subjective:    Patient ID: Janet Randolph, female    DOB: 06-14-1961, 64 y.o.   MRN: [id number]   Chief Complaint: medical management of chronic issues     HPI:  Janet Randolph is a 64 y.o. who identifies as a female who was assigned female at birth.   Social history: Lives with: husband Work history: works for [employer] in today for follow up of the following chronic medical issues:  1. Primary hypertension No c/o chest pain, sob or headache. Doe snot check blood pressure at home. BP Readings from Last 3 Encounters:  09/15/24 133/88  08/30/24 139/85  08/24/24 (!) 147/87     2. Mixed hyperlipidemia Does not watch diet very closely and does no dedicated exercise. Lab Results  Component Value Date   CHOL 140 07/02/2024   HDL 57 07/02/2024   LDLCALC 71 07/02/2024   TRIG 61 07/02/2024   CHOLHDL 2.5 07/02/2024   The ASCVD Risk score (Arnett DK, et al., 2019) failed to calculate for the following reasons:   Risk score cannot be calculated because patient has a medical history suggesting prior/existing ASCVD   * - Cholesterol units were assumed   3. Gastroesophageal reflux disease, unspecified whether esophagitis present Is on nexium  daily and works well for her  4. Recurrent major depressive disorder, in full remission (HCC) Has been on Effexor  for awhile and it works well for her.    10/05/2024   10:20 AM 04/06/2024   10:11 AM 10/02/2023   11:46 AM  Depression screen PHQ 2/9  Decreased Interest 1 1 1   Down, Depressed, Hopeless 1 1 1   PHQ - 2 Score 2 2 2   Altered sleeping 0 1 0  Tired, decreased energy 3 2 2   Change in appetite 1 2 1   Feeling bad or failure about yourself  1 2 1   Trouble concentrating 0 2 0  Moving slowly or fidgety/restless 0 0 0  Suicidal thoughts 0 0 0  PHQ-9 Score 7 11  6    Difficult doing work/chores Not difficult at all Somewhat difficult Not difficult at all     Data saved with a previous flowsheet row definition         5. GAD (generalized anxiety disorder) Is on xanax  bid. Gets anxious without taking. Has been on this for years     10/05/2024   10:20 AM 04/06/2024   10:11 AM 10/02/2023   11:47 AM 08/06/2023   12:09 PM  GAD 7 : Generalized Anxiety Score  Nervous, Anxious, on Edge 3 3  3  3    Control/stop worrying 1 1  1  1    Worry too much - different things 1 0  0  0   Trouble relaxing 2 1  2  1    Restless 1 1  1  1    Easily annoyed or irritable 3 2  2  1    Afraid - awful might happen 1 0  0  1   Total GAD 7 Score 12 8 9 8   Anxiety Difficulty Not difficult at all Somewhat difficult Not difficult at all Not difficult at all     Data saved with a previous flowsheet row definition      6. Hx of CVA Denies residual effects      New complaints: None today  Allergies  Allergen Reactions   Latex Rash   Penicillins Rash   Outpatient Encounter Medications as of 10/05/2024  Medication Sig  ALPRAZolam  (XANAX ) 0.5 MG tablet Take 1 tablet (0.5 mg total) by mouth 2 (two) times daily as needed. (Patient taking differently: Take 0.5 mg by mouth 2 (two) times daily as needed for anxiety.)   amLODipine  (NORVASC ) 2.5 MG tablet Take 1 tablet (2.5 mg total) by mouth daily.   aspirin  EC 81 MG tablet Take 1 tablet (81 mg total) by mouth daily. Swallow whole.   butalbital -acetaminophen -caffeine  (FIORICET ) 50-325-40 MG tablet Take 1 tablet by mouth every 4 (four) hours as needed for migraine. (Patient not taking: Reported on 08/30/2024)   clopidogrel  (PLAVIX ) 75 MG tablet Take 1 tablet (75 mg total) by mouth daily. (Patient not taking: Reported on 08/30/2024)   esomeprazole  (NEXIUM ) 40 MG capsule TAKE 1 CAPSULE (40 MG TOTAL) BY MOUTH DAILY. TAKE 30 MINUTES BEFORE MEAL   lisinopril  (ZESTRIL ) 40 MG tablet Take 1 tablet (40 mg total) by mouth daily.   rosuvastatin  (CRESTOR ) 20 MG tablet Take 1 tablet (20 mg total) by mouth daily.   venlafaxine  XR (EFFEXOR -XR) 75 MG 24 hr capsule Take 2 capsules  (150 mg total) by mouth daily with breakfast.   No facility-administered encounter medications on file as of 10/05/2024.    Past Surgical History:  Procedure Laterality Date   HAND SURGERY Bilateral    MOLE REMOVAL  06/12/2022   TEE WITHOUT CARDIOVERSION N/A 01/02/2022   Procedure: TRANSESOPHAGEAL ECHOCARDIOGRAM (TEE);  Surgeon: Pietro Redell RAMAN, MD;  Location: [facility] ENDOSCOPY;  Service: Cardiovascular;  Laterality: N/A;    Family History  Problem Relation Age of Onset   Melanoma Mother    Asthma Father    Crohn's disease Father    Melanoma Father    Asthma Brother    Heart Problems Brother    Stroke Brother    Healthy Son    Healthy Daughter       Controlled substance contract: n/a     Review of Systems  Constitutional:  Negative for diaphoresis.  Eyes:  Negative for pain.  Respiratory:  Negative for shortness of breath.   Cardiovascular:  Negative for chest pain, palpitations and leg swelling.  Gastrointestinal:  Negative for abdominal pain.  Endocrine: Negative for polydipsia.  Skin:  Negative for rash.  Neurological:  Negative for dizziness, weakness and headaches.  Hematological:  Does not bruise/bleed easily.  All other systems reviewed and are negative.      Objective:   Physical Exam Vitals and nursing note reviewed.  Constitutional:      General: She is not in acute distress.    Appearance: Normal appearance. She is well-developed.  HENT:     Head: Normocephalic.     Right Ear: Tympanic membrane normal.     Left Ear: Tympanic membrane normal.     Nose: Nose normal.     Mouth/Throat:     Mouth: Mucous membranes are moist.  Eyes:     Pupils: Pupils are equal, round, and reactive to light.  Neck:     Vascular: No carotid bruit or JVD.  Cardiovascular:     Rate and Rhythm: Normal rate and regular rhythm.     Heart sounds: Normal heart sounds.  Pulmonary:     Effort: Pulmonary effort is normal. No respiratory distress.     Breath sounds: Normal  breath sounds. No wheezing or rales.  Chest:     Chest wall: No tenderness.  Abdominal:     General: Bowel sounds are normal. There is no distension or abdominal bruit.     Palpations: Abdomen is  soft. There is no hepatomegaly, splenomegaly, mass or pulsatile mass.     Tenderness: There is no abdominal tenderness.     Hernia: A hernia (umbilical hernia) is present.  Musculoskeletal:        General: Normal range of motion.     Cervical back: Normal range of motion and neck supple.  Lymphadenopathy:     Cervical: No cervical adenopathy.  Skin:    General: Skin is warm and dry.  Neurological:     Mental Status: She is alert and oriented to person, place, and time.     Deep Tendon Reflexes: Reflexes are normal and symmetric.  Psychiatric:        Behavior: Behavior normal.        Thought Content: Thought content normal.        Judgment: Judgment normal.    BP 135/82   Pulse 71   Temp 97.9 F (36.6 C) (Temporal)   Ht 5' 3 (1.6 m)   Wt 159 lb (72.1 kg)   SpO2 94%   BMI 28.17 kg/m        Assessment & Plan:   Janet Randolph comes in today with chief complaint of medical management of chronic issues    Diagnosis and orders addressed:  1. Primary hypertension Low sodium diet - lisinopril  (ZESTRIL ) 20 MG tablet; Take 1 tablet (20 mg total) by mouth daily.  Dispense: 90 tablet; Refill: 1  2. Mixed hyperlipidemia Low fat diet - rosuvastatin  (CRESTOR ) 10 MG tablet; Take 1 tablet (10 mg total) by mouth daily.  Dispense: 90 tablet; Refill: 1  3. Gastroesophageal reflux disease, unspecified whether esophagitis present Avoid spicy foods Do not eat 2 hours prior to bedtime - esomeprazole  (NEXIUM ) 40 MG capsule; Take 1 capsule (40 mg total) by mouth daily. Take 30 min before meal  Dispense: 90 capsule; Refill: 1  4. Recurrent major depressive disorder, in full remission (HCC) Stress management - venlafaxine  XR (EFFEXOR -XR) 75 MG 24 hr capsule; Take 2 capsules (150 mg total)  by mouth daily with breakfast.  Dispense: 180 capsule; Refill: 1  5. GAD (generalized anxiety disorder)  - ALPRAZolam  (XANAX ) 0.5 MG tablet; Take 1 tablet (0.5 mg total) by mouth 2 (two) times daily as needed.  Dispense: 60 tablet; Refill: 5   Labs pending Health Maintenance reviewed Diet and exercise encouraged  Follow up plan: 6 months   Mary-Margaret Gladis, FNP  "

## 2024-10-08 LAB — URINE CULTURE

## 2024-10-08 MED ORDER — CIPROFLOXACIN HCL 500 MG PO TABS: 500.0000 mg | ORAL_TABLET | Freq: Two times a day (BID) | ORAL | 0 refills | Status: AC

## 2024-10-09 LAB — TOXASSURE SELECT 13 (MW), URINE

## 2024-10-29 ENCOUNTER — Ambulatory Visit: Admitting: Student in an Organized Health Care Education/Training Program

## 2024-11-22 ENCOUNTER — Ambulatory Visit: Admitting: Cardiology

## 2024-11-29 ENCOUNTER — Ambulatory Visit: Admitting: Physician Assistant

## 2025-03-25 ENCOUNTER — Ambulatory Visit: Admitting: Nurse Practitioner
